# Patient Record
Sex: Female | Born: 1935 | Race: White | Hispanic: No | Smoking: Never smoker
Health system: Southern US, Community
[De-identification: ages and names within clinical notes are randomized; demographics above are authoritative.]

## PROBLEM LIST (undated history)

## (undated) DIAGNOSIS — M810 Age-related osteoporosis without current pathological fracture: Secondary | ICD-10-CM

## (undated) HISTORY — DX: Age-related osteoporosis without current pathological fracture: M81.0

---

## 1962-02-19 HISTORY — PX: BLADDER AUGMENTATION: SHX1233

## 2008-02-20 HISTORY — PX: SPINE SURGERY: SHX786

## 2009-02-17 ENCOUNTER — Encounter: Payer: Self-pay | Admitting: Internal Medicine

## 2009-02-19 HISTORY — PX: SPINE SURGERY: SHX786

## 2009-03-10 ENCOUNTER — Encounter: Payer: Self-pay | Admitting: Internal Medicine

## 2009-09-16 ENCOUNTER — Encounter: Payer: Self-pay | Admitting: Internal Medicine

## 2010-01-24 ENCOUNTER — Ambulatory Visit: Payer: Self-pay | Admitting: Internal Medicine

## 2010-01-24 ENCOUNTER — Encounter: Payer: Self-pay | Admitting: Internal Medicine

## 2010-01-24 DIAGNOSIS — M549 Dorsalgia, unspecified: Secondary | ICD-10-CM | POA: Insufficient documentation

## 2010-01-24 DIAGNOSIS — M1990 Unspecified osteoarthritis, unspecified site: Secondary | ICD-10-CM

## 2010-01-24 DIAGNOSIS — M810 Age-related osteoporosis without current pathological fracture: Secondary | ICD-10-CM

## 2010-01-24 DIAGNOSIS — E785 Hyperlipidemia, unspecified: Secondary | ICD-10-CM | POA: Insufficient documentation

## 2010-02-01 ENCOUNTER — Telehealth: Payer: Self-pay | Admitting: Internal Medicine

## 2010-02-15 ENCOUNTER — Telehealth: Payer: Self-pay | Admitting: Internal Medicine

## 2010-02-17 ENCOUNTER — Telehealth: Payer: Self-pay | Admitting: Internal Medicine

## 2010-02-18 ENCOUNTER — Ambulatory Visit: Payer: Self-pay | Admitting: Internal Medicine

## 2010-02-18 ENCOUNTER — Telehealth: Payer: Self-pay | Admitting: Internal Medicine

## 2010-02-18 ENCOUNTER — Encounter: Payer: Self-pay | Admitting: Family Medicine

## 2010-02-19 HISTORY — PX: SPINE SURGERY: SHX786

## 2010-02-24 ENCOUNTER — Ambulatory Visit (HOSPITAL_COMMUNITY)
Admission: RE | Admit: 2010-02-24 | Discharge: 2010-02-24 | Payer: Self-pay | Source: Home / Self Care | Attending: Interventional Radiology | Admitting: Interventional Radiology

## 2010-02-24 LAB — PROTIME-INR
INR: 1.02 (ref 0.00–1.49)
Prothrombin Time: 13.6 seconds (ref 11.6–15.2)

## 2010-02-24 LAB — CBC
HCT: 39.9 % (ref 36.0–46.0)
Hemoglobin: 13.1 g/dL (ref 12.0–15.0)
MCH: 29.7 pg (ref 26.0–34.0)
MCHC: 32.8 g/dL (ref 30.0–36.0)
MCV: 90.5 fL (ref 78.0–100.0)
Platelets: 225 10*3/uL (ref 150–400)
RBC: 4.41 MIL/uL (ref 3.87–5.11)
RDW: 12.6 % (ref 11.5–15.5)
WBC: 6.9 10*3/uL (ref 4.0–10.5)

## 2010-02-24 LAB — POCT I-STAT, CHEM 8
BUN: 37 mg/dL — ABNORMAL HIGH (ref 6–23)
Calcium, Ion: 1.13 mmol/L (ref 1.12–1.32)
Chloride: 110 mEq/L (ref 96–112)
Creatinine, Ser: 1.2 mg/dL (ref 0.4–1.2)
Glucose, Bld: 96 mg/dL (ref 70–99)
HCT: 42 % (ref 36.0–46.0)
Hemoglobin: 14.3 g/dL (ref 12.0–15.0)
Potassium: 4 mEq/L (ref 3.5–5.1)
Sodium: 142 mEq/L (ref 135–145)
TCO2: 27 mmol/L (ref 0–100)

## 2010-02-24 LAB — APTT: aPTT: 28 seconds (ref 24–37)

## 2010-03-09 ENCOUNTER — Ambulatory Visit: Admit: 2010-03-09 | Payer: Self-pay | Admitting: Internal Medicine

## 2010-03-10 ENCOUNTER — Ambulatory Visit (HOSPITAL_COMMUNITY)
Admission: RE | Admit: 2010-03-10 | Discharge: 2010-03-10 | Payer: Self-pay | Source: Home / Self Care | Attending: Family Medicine | Admitting: Family Medicine

## 2010-03-21 NOTE — Assessment & Plan Note (Signed)
Summary: NEW PT TO EST/CLE   Vital Signs:  Patient profile:   77 year old female Height:      59 inches Weight:      140 pounds BMI:     28.38 O2 Sat:      97 % on Room air Temp:     98.4 degrees F oral Pulse rate:   76 / minute Pulse rhythm:   regular BP sitting:   180 / 100  (left arm) Cuff size:   regular  Vitals Entered By: Mervin Hack CMA Duncan Dull) (January 24, 2010 11:48 AM)  O2 Flow:  Room air CC: new patient to establish care   History of Present Illness: Moved up from [place] in September Lives in [facility] [place] [facility] Chauncey Fischer is with her and lives in [place]  Fairly healthy Does have osteoporosis with vertebral fracture  Has arthritis --mostly in hands doesn't need meds Had arthroscopy on knee in past--but was for meniscus  Has long standing high cholesterol takes red yeast rice and is satisfied with this no history of heart disease  Back pain is severe limits her activity using ibuprofen --up to 4 per day Centered in lumbar spine and into right buttock No leg weakness  Preventive Screening-Counseling & Management  Alcohol-Tobacco     Smoking Status: never  Allergies (verified): No Known Drug Allergies  Past History:  Past Medical History: Osteoporosis Osteoarthritis--hands mostly Hyperlipidemia  Past Surgical History: Bladder tuck in 1960's Left knee arthroscopy   ~2004 T11 vertebroplasty--  08-Mar-2022  Family History: Mom died @106  of old age Dad died of colon cancer @72  4 brothers - 1 died in 106's (didn't take care of himself), 1 died of old age in 109's. 1 died of brain cancer in childhood 1 sister--died of cancer No CAD DM in 1 brother   Social History: Widowed twice and divorced once 4 children Never Smoked Alcohol use-rarely Retired as Solicitor for [employer]. Various other jobs  Has living will. Asks for son Gerlene Burdock to be health care POA. Would accept resuscitation but no prolonged  ventilation. Not sure about tube feeds Smoking Status:  never  Review of Systems General:  weight down a few pounds still drives sleeps well wears seat belt. Eyes:  Denies double vision and vision loss-1 eye. ENT:  Denies decreased hearing and ringing in ears; Upper plate, a few on the bottom. Keeps up with dentist. CV:  Denies chest pain or discomfort, difficulty breathing at night, difficulty breathing while lying down, fainting, lightheadness, palpitations, and shortness of breath with exertion. Resp:  Denies cough and shortness of breath. GI:  Complains of constipation and indigestion; denies bloody stools, change in bowel habits, dark tarry stools, nausea, and vomiting; occ heartburn--uses OTC antacid like tums as needed  Uses prune juice for bowels. GU:  Denies dysuria and incontinence; urinary urgency if she has coffee. MS:  Complains of joint pain; denies joint swelling. Derm:  Complains of lesion(s); denies rash; rash in head--?psoriasis vs seb derm Has appt with derm. Neuro:  Denies headaches, numbness, tingling, and weakness. Psych:  Denies anxiety and depression. Allergy:  Denies seasonal allergies and sneezing.  Physical Exam  General:  alert and normal appearance.   Eyes:  pupils equal, pupils round, and pupils reactive to light.   Mouth:  no erythema, no exudates, and no lesions.   Neck:  supple, no masses, no thyromegaly, no carotid bruits, and no cervical lymphadenopathy.   Lungs:  normal respiratory effort, no intercostal retractions, no accessory muscle use, and normal breath sounds.   Heart:  normal rate, regular rhythm, no murmur, and no gallop.   Abdomen:  soft, non-tender, and no masses.   Msk:  mild thickening in hands mild spine tenderness  ~L3-4 with mild tenderness to the right also SLR negative  Neurologic:  gait slow but normal No focal weakness Psych:  normally interactive, good eye contact, not anxious appearing, and not depressed appearing.      Impression & Recommendations:  Problem # 1:  BACK PAIN (ICD-724.5) Assessment Deteriorated not clear if this is just muscular or from new fracture awaiting x-ray reading now will try tramadol for now and set up follow up  Her updated medication list for this problem includes:    Ibuprofen 200 Mg Tabs (Ibuprofen) ..... Once daily    Tramadol Hcl 50 Mg Tabs (Tramadol hcl) .Marland Kitchen... 1 tab by mouth three times a day as needed for severe back pain  Orders: T-Lumbar Spine Complete, 5 Views (71110TC)  Problem # 2:  OSTEOPOROSIS (ICD-733.00) Assessment: Comment Only have asked her to add extra vitaimn D will await the results of the DEXA done earlier this year consider alendronate if sig osteoporosis or if radiologist believes there are multiple compression fractures   Her updated medication list for this problem includes:    Calcium-vitamin D 500-125 Mg-unit Tabs (Calcium carbonate-vitamin d) ..... Once daily    Vitamin D 1000 Unit Tabs (Cholecalciferol) .Marland Kitchen... 1 tab daily to help bones  Problem # 3:  OSTEOARTHRITIS (ICD-715.90) Assessment: Comment Only other than her back, her symptoms are mild no new Rx needed  Her updated medication list for this problem includes:    Ibuprofen 200 Mg Tabs (Ibuprofen) ..... Once daily    Tramadol Hcl 50 Mg Tabs (Tramadol hcl) .Marland Kitchen... 1 tab by mouth three times a day as needed for severe back pain  Problem # 4:  HYPERLIPIDEMIA (ICD-272.4) Assessment: Comment Only treatment not indicated with her good health at her age okay to continue the red yeast rice though will revew her records when they come  Complete Medication List: 1)  Red Yeast Rice 600 Mg Caps (Red yeast rice extract) .... Once daily 2)  Vitamin C 1000 Mg Tabs (Ascorbic acid) .... Once daily 3)  Womens Multivitamin Plus Tabs (Multiple vitamins-minerals) .... Once daily 4)  Vitamin A-beta Carotene 14782 Unit Caps (Beta carotene) .... Once daily 5)  Calcium-vitamin D 500-125 Mg-unit  Tabs (Calcium carbonate-vitamin d) .... Once daily 6)  Fish Oil 1000 Mg Caps (Omega-3 fatty acids) .... Once daily 7)  Ibuprofen 200 Mg Tabs (Ibuprofen) .... Once daily 8)  Miralax Powd (Polyethylene glycol 3350) .Marland Kitchen.. 1 capful daily with water as needed for constipation 9)  Vitamin D 1000 Unit Tabs (Cholecalciferol) .Marland Kitchen.. 1 tab daily to help bones 10)  Tramadol Hcl 50 Mg Tabs (Tramadol hcl) .Marland Kitchen.. 1 tab by mouth three times a day as needed for severe back pain  Other Orders: TD Toxoids IM 7 YR + (95621) Admin 1st Vaccine (30865)  Patient Instructions: 1)  Please start vitamin D 1000 international units daily 2)  Please schedule a follow-up appointment in 1 month.  Prescriptions: TRAMADOL HCL 50 MG TABS (TRAMADOL HCL) 1 tab by mouth three times a day as needed for severe back pain  #90 x 0   Entered and Authorized by:   Cindee Salt MD   Signed by:   Cindee Salt MD on 01/24/2010  Method used:   [facility]  [address] [id number]       [address]       [place], [place]  [address]       Ph: [phone]       Fax: [phone]   RxID:   [id number]    Orders Added: 1)  T-Lumbar Spine Complete, 5 Views [71110TC] 2)  TD Toxoids IM 7 YR + [90714] 3)  Admin 1st Vaccine [90471] 4)  New Patient Level IV [99204]   Immunization History:  Tetanus/Td Immunization History:    Tetanus/Td:  Td (01/24/2010)  Influenza Immunization History:    Influenza:  Historical (11/18/2009)  Pneumovax Immunization History:    Pneumovax:  Historical (02/19/2002)  Immunizations Administered:  Tetanus Vaccine:    Vaccine Type: Td    Site: left deltoid    Mfr: Sanofi Pasteur    Dose: 0.5 ml    Route: IM    Given by: Benny Lennert CMA (AAMA)    Exp. Date: 03/23/2011    Lot #: [id number]    VIS given: 01/07/08 version given January 24, 2010.   Immunization History:  Influenza Immunization History:    Influenza:  Historical  (11/18/2009)  Pneumovax Immunization History:    Pneumovax:  Historical (02/19/2002)  Immunizations Administered:  Tetanus Vaccine:    Vaccine Type: Td    Site: left deltoid    Mfr: Sanofi Pasteur    Dose: 0.5 ml    Route: IM    Given by: Benny Lennert CMA (AAMA)    Exp. Date: 03/23/2011    Lot #: [id number]    VIS given: 01/07/08 version given January 24, 2010.  Current Allergies (reviewed today): No known allergies

## 2010-03-23 NOTE — Progress Notes (Signed)
Summary: back is not any better  Phone Note Call from Patient Call back at Home Phone [phone]   Caller: Patient Call For: Cindee Salt MD Summary of Call: Pt was seen for her back pain a couple of weeks ago and she is not any better, this is really bothering her.  She is asking if she can have an MRI.  She would prefer to go to [facility] for that.  She was told to follow up with you in mid january but she says she cant wait that long.  She would like something done asap. Initial call taken by: Lowella Petties CMA, AAMA,  February 15, 2010 11:20 AM  Follow-up for Phone Call        okay to set up MRI given persistent pain and x-ray findings Follow-up by: Cindee Salt MD,  February 15, 2010 11:58 AM

## 2010-03-23 NOTE — Progress Notes (Signed)
Summary: MRI results  Phone Note From Other Clinic   Caller: Dr Tenny Craw Call For: letvak Summary of Call: Pt had xray done at [facility] as outpatient----Pt has acute/ subacute compression fx L2-3 and subacute compression fracture L1-2 with extensive DDD.   Initial call taken by: Loreen Freud DO,  February 18, 2010 11:44 AM  Follow-up for Phone Call        The MRI??  x-ray done several weeks ago  Please confirm this If this is the finding of the MRI, let the patient know she has fracture of vertebrae and typically the pain does get better over several weeks Cindee Salt MD  February 20, 2010 9:20 AM   got MRI faxed results from [facility], will get Dr.Aron to confirm. Dr. Dayton Martes the report in on your desk. Please advise. DeShannon Smith CMA Duncan Dull)  February 21, 2010 12:52 PM   MRI results are scanned into emr, she does have compression fractures. Ruthe Mannan MD  February 21, 2010 1:37 PM  patient would like to know what can be done to fix this? pt states she's had this before and they put cement in and fixed it right away, pt states she in pain and it's not getting better. Please advise. DeShannon Katrinka Blazing CMA Duncan Dull)  February 21, 2010 2:23 PM   Additional Follow-up for Phone Call Additional follow up Details #1::        I would recommend conservation management with pain control.  She can discuss further tx options with Dr. Alphonsus Sias next wee but I I treat my patients with pain medications. Ruthe Mannan MD  February 21, 2010 2:37 PM  pt would like to be referred to [facility] because of the pain. I tried to explain to pt that it might take a little longer for referrals, pt wants something this week, pt states she will not wait any longer and she will go to [facility] if something if is not done. I asked pt was she still taking the Tramadol, she said "no, it made her sick" she is taking ibuprofen 4 times daily. Please advise. DeShannon Smith CMA Duncan Dull)  February 21, 2010 3:18 PM   we can try oxycodone.  If she is  not willing, I will place referral for pain clnic but there are many medications she has not tried yet. Ruthe Mannan MD  February 21, 2010 3:22 PM  I spoke with Shirlee Limerick, she came me [facility] name and number in [place], pt wants surgery to fix the problem. Ok to call and get appt? DeShannon Smith CMA Duncan Dull)  February 21, 2010 3:36 PM   yes ok with me if pt is insisting. Ruthe Mannan MD  February 22, 2010 7:35 AM  Not sure why this keeps getting sent back to me. Ruthe Mannan MD  February 22, 2010 10:47 AM  Dr.Califf could see pt Monday 02/27/10, but pt refused and wanted to be seen by a Interventional Radiologist, Dr.Copland suggested Dr.Tony Deveshwar, I called his office to schedule a consult, Dr. Corliss Skains can see pt tomorrow at [facility] at 12:30pm, I advised pt to pick up MRI disc from [facility] today. Previous MRI from [place] faxed to Dr. Corliss Skains. DeShannon Smith CMA Duncan Dull)  February 22, 2010 10:51 AM   I would have thought of Dr Gerrit Heck in [place] as well.  not sure she is a candidate for vertebroplasty but appopriate for evaluation to decide Cindee Salt MD  February 22, 2010 9:30 PM     Additional Follow-up for Phone Call Additional follow up Details #2::    ok.  Ruthe Mannan MD  February 22, 2010 10:52 AM

## 2010-03-23 NOTE — Progress Notes (Signed)
Summary: pt doing ok now  Phone Note Call from Patient   Caller: Patient Call For: Cindee Salt MD Reason for Call: Talk to Doctor Summary of Call: Pt called to let you know that she couldnt take tramadol because it caused nausea and sweating.  She is doing ok now, taking ibuprofen, which is helping.  She will see you at her follow up appt. Initial call taken by: Lowella Petties CMA, AAMA,  February 01, 2010 10:14 AM  Follow-up for Phone Call        okay  glad she is better Follow-up by: Cindee Salt MD,  February 01, 2010 1:57 PM   New Allergies: TRAMADOL HCL (TRAMADOL HCL) New Allergies: TRAMADOL HCL (TRAMADOL HCL)

## 2010-03-23 NOTE — Letter (Signed)
Summary: Records Dated 02-16-09 thru 09-21-09/Susan Binoy MD  Records Dated 02-16-09 thru 09-21-09/Susan Binoy MD   Imported By: Lanelle Bal 02/04/2010 10:19:29  _____________________________________________________________________  External Attachment:    Type:   Image     Comment:   External Document

## 2010-03-23 NOTE — Progress Notes (Signed)
Summary: Constipated  Phone Note Call from Patient Call back at Home Phone [phone]   Caller: Patient Call For: Cindee Salt MD Summary of Call: pt calling asking for something for constipation, she states she's been " bound up" pt would like the same thing they give your before your colonoscopy. Initial call taken by: Mervin Hack CMA Duncan Dull),  February 17, 2010 10:14 AM  Follow-up for Phone Call        per verbal with Dr.Letvak, they give you miralax before colonoscopy, pt is to take a full capful every 2 hours until relief. DeShannon Smith CMA Duncan Dull)  February 17, 2010 10:14 AM   spoke with pt she will try that, she's only been doing a tablespoon in a little water. I advised she should have at least 8oz of water, correct? DeShannon Katrinka Blazing CMA Duncan Dull)  February 17, 2010 10:17 AM   Yes, a full capful (17gm) with a big glass of water (at least 8 ounces) Follow-up by: Cindee Salt MD,  February 17, 2010 11:12 AM  Additional Follow-up for Phone Call Additional follow up Details #1::        Spoke with patient and advised results.  Additional Follow-up by: Mervin Hack CMA Duncan Dull),  February 17, 2010 2:49 PM

## 2010-03-23 NOTE — Progress Notes (Signed)
Summary: LORAZEPAM/ MRI  Phone Note Call from Patient   Caller: Patient Summary of Call: Called patient  to schedule the MRI at [facility] for Saturday 02/18/2010 . She wants to know if you can call in some medicine for anxiety she is claustrophobic. [facility] [address] Please have nurse call her back when med is called in.  Initial call taken by: Carlton Adam,  February 15, 2010 2:45 PM  Follow-up for Phone Call        Lorazepam 0.5mg   #4 x 0 take 1 tab about 1 hour  before MRI and repeat 1/2 hour before if needed Make sure someone else is driving Follow-up by: Cindee Salt MD,  February 15, 2010 9:18 PM  Additional Follow-up for Phone Call Additional follow up Details #1::        Rx Called In, Spoke with patient and advised results.  Additional Follow-up by: Mervin Hack CMA Duncan Dull),  February 16, 2010 8:45 AM    New/Updated Medications: LORAZEPAM 0.5 MG TABS (LORAZEPAM) take 1 tab about 1 hour  before MRI and repeat 1/2 hour before if needed Prescriptions: LORAZEPAM 0.5 MG TABS (LORAZEPAM) take 1 tab about 1 hour  before MRI and repeat 1/2 hour before if needed  #4 x 0   Entered by:   Mervin Hack CMA (AAMA)   Authorized by:   Cindee Salt MD   Signed by:   Mervin Hack CMA (AAMA) on 02/16/2010   Method used:   [facility] ...       [facility]  [address] [id number]       [address]       [place], [place]  [address]       Ph: [phone]       Fax: [phone]   RxID:   [id number]

## 2010-04-27 ENCOUNTER — Emergency Department: Payer: Self-pay | Admitting: Emergency Medicine

## 2011-02-21 ENCOUNTER — Other Ambulatory Visit (HOSPITAL_COMMUNITY): Payer: Self-pay | Admitting: Interventional Radiology

## 2011-02-21 DIAGNOSIS — M549 Dorsalgia, unspecified: Secondary | ICD-10-CM

## 2011-02-21 DIAGNOSIS — IMO0002 Reserved for concepts with insufficient information to code with codable children: Secondary | ICD-10-CM

## 2011-02-22 ENCOUNTER — Ambulatory Visit (HOSPITAL_COMMUNITY)
Admission: RE | Admit: 2011-02-22 | Discharge: 2011-02-22 | Disposition: A | Discharge status: Home / Self Care | Payer: MEDICARE | Source: Ambulatory Visit | Attending: Interventional Radiology | Admitting: Interventional Radiology

## 2011-02-22 ENCOUNTER — Other Ambulatory Visit: Payer: Self-pay | Admitting: Radiology

## 2011-02-22 ENCOUNTER — Other Ambulatory Visit (HOSPITAL_COMMUNITY): Payer: Self-pay | Admitting: Interventional Radiology

## 2011-02-22 DIAGNOSIS — M545 Low back pain, unspecified: Secondary | ICD-10-CM | POA: Insufficient documentation

## 2011-02-22 DIAGNOSIS — IMO0002 Reserved for concepts with insufficient information to code with codable children: Secondary | ICD-10-CM

## 2011-02-22 DIAGNOSIS — M5124 Other intervertebral disc displacement, thoracic region: Secondary | ICD-10-CM | POA: Insufficient documentation

## 2011-02-22 DIAGNOSIS — M4120 Other idiopathic scoliosis, site unspecified: Secondary | ICD-10-CM | POA: Insufficient documentation

## 2011-02-22 DIAGNOSIS — M47817 Spondylosis without myelopathy or radiculopathy, lumbosacral region: Secondary | ICD-10-CM | POA: Insufficient documentation

## 2011-02-22 DIAGNOSIS — M549 Dorsalgia, unspecified: Secondary | ICD-10-CM

## 2011-02-22 DIAGNOSIS — S32009A Unspecified fracture of unspecified lumbar vertebra, initial encounter for closed fracture: Secondary | ICD-10-CM | POA: Insufficient documentation

## 2011-02-22 DIAGNOSIS — X58XXXA Exposure to other specified factors, initial encounter: Secondary | ICD-10-CM | POA: Insufficient documentation

## 2011-02-23 ENCOUNTER — Other Ambulatory Visit (HOSPITAL_COMMUNITY): Payer: Self-pay | Admitting: Interventional Radiology

## 2011-02-23 ENCOUNTER — Ambulatory Visit (HOSPITAL_COMMUNITY)
Admission: RE | Admit: 2011-02-23 | Discharge: 2011-02-23 | Disposition: A | Discharge status: Home / Self Care | Payer: MEDICARE | Source: Ambulatory Visit | Attending: Interventional Radiology | Admitting: Interventional Radiology

## 2011-02-23 DIAGNOSIS — IMO0002 Reserved for concepts with insufficient information to code with codable children: Secondary | ICD-10-CM

## 2011-02-23 DIAGNOSIS — M8448XA Pathological fracture, other site, initial encounter for fracture: Secondary | ICD-10-CM | POA: Insufficient documentation

## 2011-02-23 LAB — CBC
MCH: 30.3 pg (ref 26.0–34.0)
MCV: 90.3 fL (ref 78.0–100.0)
Platelets: 211 10*3/uL (ref 150–400)
RDW: 12.8 % (ref 11.5–15.5)

## 2011-02-23 LAB — BASIC METABOLIC PANEL
CO2: 27 mEq/L (ref 19–32)
Calcium: 9.4 mg/dL (ref 8.4–10.5)
Creatinine, Ser: 0.75 mg/dL (ref 0.50–1.10)
GFR calc non Af Amer: 71 mL/min — ABNORMAL LOW (ref >90)
Glucose, Bld: 98 mg/dL (ref 70–99)

## 2011-02-23 LAB — APTT: aPTT: 29 seconds (ref 24–37)

## 2011-02-23 MED ORDER — SODIUM CHLORIDE 0.9 % IV SOLN: INTRAVENOUS | Status: AC

## 2011-02-23 MED ORDER — TOBRAMYCIN SULFATE 1.2 G IJ SOLR
INTRAMUSCULAR | Status: AC
  Filled 2011-02-23: qty 1.2

## 2011-02-23 MED ORDER — VANCOMYCIN HCL IN DEXTROSE 1-5 GM/200ML-% IV SOLN
1000.0000 mg | Freq: Once | INTRAVENOUS | Status: AC
  Administered 2011-02-23: 1000 mg via INTRAVENOUS
  Filled 2011-02-23: qty 200

## 2011-02-23 MED ORDER — SODIUM CHLORIDE 0.9 % IV SOLN: INTRAVENOUS | Status: DC

## 2011-02-23 MED ORDER — FENTANYL CITRATE 0.05 MG/ML IJ SOLN
INTRAMUSCULAR | Status: AC | PRN
  Administered 2011-02-23 (×2): 25 ug via INTRAVENOUS

## 2011-02-23 MED ORDER — FENTANYL CITRATE 0.05 MG/ML IJ SOLN
INTRAMUSCULAR | Status: AC
  Filled 2011-02-23: qty 6

## 2011-02-23 MED ORDER — MIDAZOLAM HCL 2 MG/2ML IJ SOLN
INTRAMUSCULAR | Status: AC
  Filled 2011-02-23: qty 6

## 2011-02-23 MED ORDER — CEFAZOLIN SODIUM 1-5 GM-% IV SOLN
1.0000 g | INTRAVENOUS | Status: DC
  Filled 2011-02-23: qty 50

## 2011-02-23 MED ORDER — MIDAZOLAM HCL 5 MG/5ML IJ SOLN
INTRAMUSCULAR | Status: AC | PRN
  Administered 2011-02-23 (×2): 1 mg via INTRAVENOUS

## 2011-02-23 NOTE — H&P (Signed)
Rebekah Bridges is an 78 y.o. female.   Chief Complaint: Thoracic #12 fracture; back pain HPI: hx of Thoracic #11; Lumbar #2 and #3 previous vertebroplasties Scheduled today for Thoracic #12 Vertebroplasty vs Kyphoplasty  No past medical history on file.  No past surgical history on file.  No family history on file. Social History:  does not have a smoking history on file. She does not have any smokeless tobacco history on file. Her alcohol and drug histories not on file.  Allergies:  Allergies  Allergen Reactions  . Penicillins Nausea Only    And a rash  . Sulfa Antibiotics Nausea Only    And rash  . Tramadol Hcl Other (See Comments)    REACTION: nausea and sweating    No current outpatient prescriptions on file as of 02/23/2011.   Medications Prior to Admission  Medication Dose Route Frequency Provider Last Rate Last Dose  . 0.9 %  sodium chloride infusion   Intravenous Continuous D Jeananne Rama, PA      . ceFAZolin (ANCEF) IVPB 1 g/50 mL premix  1 g Intravenous On Call D Jeananne Rama, PA        Results for orders placed during the hospital encounter of 02/23/11 (from the past 48 hour(s))  APTT     Status: Normal   Collection Time   02/23/11 11:00 AM      Component Value Range Comment   aPTT 29  24 - 37 (seconds)   BASIC METABOLIC PANEL     Status: Abnormal   Collection Time   02/23/11 11:00 AM      Component Value Range Comment   Sodium 139  135 - 145 (mEq/L)    Potassium 3.7  3.5 - 5.1 (mEq/L)    Chloride 101  96 - 112 (mEq/L)    CO2 27  19 - 32 (mEq/L)    Glucose, Bld 98  70 - 99 (mg/dL)    BUN 19  6 - 23 (mg/dL)    Creatinine, Ser 1.19  0.50 - 1.10 (mg/dL)    Calcium 9.4  8.4 - 10.5 (mg/dL)    GFR calc non Af Amer 71 (*) >90 (mL/min)    GFR calc Af Amer 82 (*) >90 (mL/min)   CBC     Status: Normal   Collection Time   02/23/11 11:00 AM      Component Value Range Comment   WBC 6.4  4.0 - 10.5 (K/uL)    RBC 4.75  3.87 - 5.11 (MIL/uL)    Hemoglobin 14.4  12.0 - 15.0 (g/dL)     HCT 14.7  82.9 - 56.2 (%)    MCV 90.3  78.0 - 100.0 (fL)    MCH 30.3  26.0 - 34.0 (pg)    MCHC 33.6  30.0 - 36.0 (g/dL)    RDW 13.0  86.5 - 78.4 (%)    Platelets 211  150 - 400 (K/uL)   PROTIME-INR     Status: Normal   Collection Time   02/23/11 11:00 AM      Component Value Range Comment   Prothrombin Time 13.5  11.6 - 15.2 (seconds)    INR 1.01  0.00 - 1.49     Mr Lumbar Spine Wo Contrast  02/22/2011  *RADIOLOGY REPORT*  Clinical Data: Compression fracture.  Pain in the low back. Multiple prior compression fractures.  MRI LUMBAR SPINE WITHOUT CONTRAST  Technique:  Multiplanar and multiecho pulse sequences of the lumbar spine were obtained without intravenous contrast.  Comparison: 03/10/2010.  Findings: There is mild levoconvex lumbar scoliosis with the apex at L2-L3.  Vertebral augmentation has been performed at T11, L2 and L3 which appears similar to the prior exam.  There is a new T12 superior endplate compression fracture with 25% loss of vertebral body height.  Radiating bone marrow edema is present compatible with acute subacute time course.  No retropulsion.  The paraspinal soft tissues demonstrate bilateral renal cystic lesions likely renal cysts.  Per CMS PQRS reporting requirements (PQRS Measure 24): Given the patient's age of greater than 50 and the fracture site (hip, distal radius, or spine), the patient should be tested for osteoporosis using DXA, and the appropriate treatment considered based on the DXA results.  T10-T11:  Negative.  T11-T12:  Negative.  T12-L1:  Broad-based disc bulge with small left paracentral protrusion appears similar to prior.  L1-L2:  Unchanged L2 retropulsion.  Resolved bone marrow edema status post vertebral augmentation.  Moderate central stenosis associated with retropulsion and broad-based disc bulging. Posterior ligamentum flavum redundancy contributes.  Foramina appear patent.  L2-L3:  Moderate central stenosis is multifactorial. Resolved L3 marrow edema  following vertebral augmentation. Broad-based posterior disc bulge and ligamentum flavum redundancy are present.  L3-L4: Moderate central stenosis appears unchanged compared to prior.  This is multifactorial, associated with broad-based disc bulge, and posterior element hypertrophy and ligamentum flavum redundancy.  L4-L5:  Disc desiccation and degeneration with moderate central stenosis.  Narrowing of both lateral recesses without neural compression.  Foramina appear patent.  L5-S1:  No stenosis.  IMPRESSION:  1.  Acute or subacute T12 superior endplate compression fracture at 25% loss of vertebral body height. 2.  Vertebral augmentation at T11, L2 and L3 with resolved bone marrow edema compared to prior exam of 03/10/2010. 3.  Multilevel lumbar spondylosis appears similar to prior.  Original Report Authenticated By: Andreas Newport, M.D.    Review of Systems  Constitutional: Negative for fever and chills.  Respiratory: Negative for cough.   Cardiovascular: Negative for chest pain.  Gastrointestinal: Negative for nausea, vomiting and abdominal pain.  Neurological: Negative for headaches.    There were no vitals taken for this visit. Physical Exam  Constitutional: She is oriented to person, place, and time. She appears well-developed and well-nourished.  HENT:  Head: Normocephalic.  Eyes: EOM are normal.  Neck: Normal range of motion.  Cardiovascular: Normal rate, regular rhythm and normal heart sounds.   No murmur heard. Respiratory: Effort normal and breath sounds normal. She has no wheezes.  GI: Soft. Bowel sounds are normal.  Musculoskeletal: Normal range of motion.  Neurological: She is alert and oriented to person, place, and time.  Skin: Skin is warm and dry.     Assessment/Plan Thoracic #12 fracture; scheduled for T12 KP vs VP Pt aware of procedure benefits and risks and agreeable to proceed. Consent signed.  Ivania Teagarden A 02/23/2011, 12:24 PM

## 2011-02-23 NOTE — Procedures (Signed)
S/P  T12 balloon  kyphoplasty  For painful compression fracture.. No acute complications.

## 2011-02-27 ENCOUNTER — Telehealth (HOSPITAL_COMMUNITY): Payer: Self-pay

## 2011-03-06 ENCOUNTER — Other Ambulatory Visit (HOSPITAL_COMMUNITY): Payer: Self-pay | Admitting: Interventional Radiology

## 2011-03-06 DIAGNOSIS — IMO0002 Reserved for concepts with insufficient information to code with codable children: Secondary | ICD-10-CM

## 2011-03-09 ENCOUNTER — Ambulatory Visit (HOSPITAL_COMMUNITY): Payer: BC Managed Care – PPO

## 2011-05-08 NOTE — Telephone Encounter (Signed)
Contacts         Type  Contact  Phone    02/27/2011 10:07 AM  Phone (Outgoing)  Lorra Hals (Self)      Left Message- left vmail to schedule 2 week f/u appt

## 2012-07-07 ENCOUNTER — Telehealth: Payer: Self-pay | Admitting: Internal Medicine

## 2012-07-07 NOTE — Telephone Encounter (Signed)
Left message for patient to confirm appointment with Dr. Lorin Picket.

## 2012-07-07 NOTE — Telephone Encounter (Signed)
error 

## 2012-08-06 ENCOUNTER — Telehealth: Payer: Self-pay | Admitting: Internal Medicine

## 2012-08-06 NOTE — Telephone Encounter (Signed)
Pt son (richard) came in today wanting to see if we could work ms Shankland in sooner than her appointment on 08/29/12 new patient appointment  Gerlene Burdock stated that ms Sandquist is having problems with her left leg and behind her knee is very painful and she is having a hard time getting around. Please advise

## 2012-08-06 NOTE — Telephone Encounter (Signed)
Only if there is a 30 minute slot available .  Do not take up my urgent slots for established patients

## 2012-08-06 NOTE — Telephone Encounter (Signed)
Appointment 7/8 pt aware

## 2012-08-26 ENCOUNTER — Ambulatory Visit (INDEPENDENT_AMBULATORY_CARE_PROVIDER_SITE_OTHER): Payer: BC Managed Care – PPO | Admitting: Internal Medicine

## 2012-08-26 ENCOUNTER — Encounter: Payer: Self-pay | Admitting: Internal Medicine

## 2012-08-26 VITALS — BP 132/86 | HR 68 | Temp 98.0°F | Resp 14 | Ht <= 58 in | Wt 140.5 lb

## 2012-08-26 DIAGNOSIS — Z87898 Personal history of other specified conditions: Secondary | ICD-10-CM

## 2012-08-26 DIAGNOSIS — M810 Age-related osteoporosis without current pathological fracture: Secondary | ICD-10-CM

## 2012-08-26 DIAGNOSIS — I739 Peripheral vascular disease, unspecified: Secondary | ICD-10-CM

## 2012-08-26 NOTE — Patient Instructions (Addendum)
We are setting you up for an eye exam and a test on your circulation   Please return for fasting labs at your leisure   Please call us the next time you have a severe headache  You may add one or two tylenol to your ibuprofen as it may help your pain

## 2012-08-26 NOTE — Progress Notes (Signed)
Patient ID: Rebekah Bridges, female   DOB: 10/27/1915, 79 y.o.   MRN: [id number]     Patient Active Problem List   Diagnosis Date Noted  . PAD (peripheral artery disease) 08/28/2012  . HYPERLIPIDEMIA 01/24/2010  . OSTEOARTHRITIS 01/24/2010  . BACK PAIN 01/24/2010  . OSTEOPOROSIS 01/24/2010    Subjective:  CC:   Chief Complaint  Patient presents with  . Establish Care    HPI:   Rebekah Bridges is a 79 y.o. female who presents as a new patient to establish primary care with the chief complaint of Cc: bilateral leg pain, chronic, etiology and workup unclear  secondary to VI.  Has been receiving "laser" treatments aimed at knees by  [facility], chiropractor performing  K laser treatment to" improve blood flow" .  Has had 4 treatments,  6 more to go costing $350 for 10 tx's .  Feels that it is helping "a little" and denies any post procedure irritation or sequelae.   She is taking ibuprofen 4 tablets daily which relieves some of her pain .  Her Left shin feels numb but also aches.She does not let Dr Chriss Czar perform spinal manipulation .  History of left knee arthrosocopy  Had adverse reaction to tramadol.    transferring care from [facility]   No traditional healthcare in 2.5 years.    2) recent episode of persistent  headache, that lasted 3 to 4 days.,  Severe,  not relieved by ibuprofen,.  No changes in vision, no neurologic symptoms. Does not recall anu accompanying sinus symptoms,  Started with a stiff neck.  No fevers or menstal status changes per son.  .  Now resolved,  But scalp feels funny now in that area right occipital area, not tender or red.  No history of tick bite, travel, or fall.    3) History of osteoporosis with vertebral fracture in 2010 after lifitng a 22 lb [place] at home during thanksgiving.  Fractures occurred in the  Upper thoracicarea, and were  followed by 2 more since her move to [place].,  All were treated with vertebroplasty, 1 in [place], [place] and  2 at [facility] by [employer].  The  [place] procidure resulted in immediate resolution of pain bc patient reports that the porcedure involved "drilling two holes instead of one".  Has had sustained pain relief.   The 2 done at [facility] took a week before pain was resolved..  Last fracture 2012  No priro  heavy lifting , no trauma. Prior treatment for osteoporosis was offered , unsure which one ,  ("somethingin a monthly shot  by the [employer]") was too $$$$  In 2010.      Past Medical History  Diagnosis Date  . Osteoporosis     s/p vertebroplasty for fracutres x 3    Past Surgical History  Procedure Laterality Date  . Bladder augmentation  1964  . Spine surgery  2010  . Spine surgery  2011  . Spine surgery  2012    Family History  Problem Relation Age of Onset  . Cancer Father   . Learning disabilities Paternal Grandmother     History   Social History  . Marital Status: Widowed    Spouse Name: N/A    Number of Children: N/A  . Years of Education: N/A   Occupational History  . Not on file.   Social History Main Topics  . Smoking status: Never Smoker   . Smokeless tobacco: Never Used  . Alcohol Use:  Yes  . Drug Use: No  . Sexually Active: Not on file   Other Topics Concern  . Not on file   Social History Narrative  . No narrative on file    Allergies  Allergen Reactions  . Penicillins Nausea Only    And a rash  . Sulfa Antibiotics Nausea Only    And rash  . Tramadol Hcl Other (See Comments)    REACTION: nausea and sweating      Review of Systems:   Patient denies headache, fevers, malaise, unintentional weight loss, skin rash, eye pain, sinus congestion and sinus pain, sore throat, dysphagia,  hemoptysis , cough, dyspnea, wheezing, chest pain, palpitations, orthopnea, edema, abdominal pain, nausea, melena, diarrhea, constipation, flank pain, dysuria, hematuria, urinary  Frequency, nocturia, numbness, tingling, seizures,  Focal weakness, Loss of consciousness,  Tremor,  insomnia, depression, anxiety, and suicidal ideation.     Objective:  BP 132/86  Pulse 68  Temp(Src) 98 F (36.7 C) (Oral)  Resp 14  Ht 4\' 10"  (1.473 m)  Wt 140 lb 8 oz (63.73 kg)  BMI 29.37 kg/m2  SpO2 96%  General appearance: alert, cooperative and appears stated age Ears: normal TM's and external ear canals both ears Throat: lips, mucosa, and tongue normal; teeth and gums normal Neck: no adenopathy, no carotid bruit, supple, symmetrical, trachea midline and thyroid not enlarged, symmetric, no tenderness/mass/nodules Back: symmetric, no curvature. ROM normal. No CVA tenderness. Lungs: clear to auscultation bilaterally Heart: regular rate and rhythm, S1, S2 normal, no murmur, click, rub or gallop Abdomen: soft, non-tender; bowel sounds normal; no masses,  no organomegaly Pulses: DPS are difficutl to palpate.  Varicose veins noted as well Skin: Skin color, texture, turgor normal. No rashes or lesions Lymph nodes: Cervical, supraclavicular, and axillary nodes normal.  Assessment and Plan:  PAD (peripheral artery disease) Suspected, based on exam with feeble pulses and bilateral leg pain.  Refer to [facility] for ABIS  OSTEOPOROSIS With prior vertebral fractures treated with kyphoplasty.  Given her extreme age, no therapy has been offered. continue calicum, vitamin d and weight bearing exercise,  History of headache Given its complete resolution and normal neurologic exam. No further workup at this time. If episodes recurs will reevaluate   Updated Medication List Outpatient Encounter Prescriptions as of 08/26/2012  Medication Sig Dispense Refill  . ibuprofen (ADVIL,MOTRIN) 200 MG tablet Take 600 mg by mouth every 8 (eight) hours as needed. For pain       . OVER THE COUNTER MEDICATION Take 1 tablet by mouth every 6 (six) hours as needed. Antacid for stomach distress        No facility-administered encounter medications on file as of 08/26/2012.

## 2012-08-28 ENCOUNTER — Encounter: Payer: Self-pay | Admitting: Internal Medicine

## 2012-08-28 DIAGNOSIS — Z87898 Personal history of other specified conditions: Secondary | ICD-10-CM | POA: Insufficient documentation

## 2012-08-28 DIAGNOSIS — I739 Peripheral vascular disease, unspecified: Secondary | ICD-10-CM | POA: Insufficient documentation

## 2012-08-28 NOTE — Assessment & Plan Note (Signed)
Given its complete resolution and normal neurologic exam. No further workup at this time. If episodes recurs will reevaluate

## 2012-08-28 NOTE — Assessment & Plan Note (Signed)
Suspected, based on exam with feeble pulses and bilateral leg pain.  Refer to [facility] for ABIS

## 2012-08-28 NOTE — Assessment & Plan Note (Signed)
With prior vertebral fractures treated with kyphoplasty.  Given her extreme age, no therapy has been offered. continue calicum, vitamin d and weight bearing exercise,

## 2012-08-29 ENCOUNTER — Ambulatory Visit: Payer: BC Managed Care – PPO | Admitting: Internal Medicine

## 2012-09-01 ENCOUNTER — Telehealth: Payer: Self-pay | Admitting: *Deleted

## 2012-09-01 DIAGNOSIS — E785 Hyperlipidemia, unspecified: Secondary | ICD-10-CM

## 2012-09-01 DIAGNOSIS — R5381 Other malaise: Secondary | ICD-10-CM

## 2012-09-01 DIAGNOSIS — E559 Vitamin D deficiency, unspecified: Secondary | ICD-10-CM

## 2012-09-01 NOTE — Telephone Encounter (Signed)
Looks like this is [name]'s pt, may need to forward to her.  Thanks.

## 2012-09-01 NOTE — Telephone Encounter (Signed)
Pt is coming for labs tomorrow 07.15.2014 what labs and dx?

## 2012-09-01 NOTE — Telephone Encounter (Signed)
Pt is coming in for lab work tomorrow 07.15.2014, what labs and dx?

## 2012-09-02 ENCOUNTER — Other Ambulatory Visit (INDEPENDENT_AMBULATORY_CARE_PROVIDER_SITE_OTHER): Payer: BC Managed Care – PPO

## 2012-09-02 DIAGNOSIS — Z79899 Other long term (current) drug therapy: Secondary | ICD-10-CM

## 2012-09-02 DIAGNOSIS — E559 Vitamin D deficiency, unspecified: Secondary | ICD-10-CM

## 2012-09-02 DIAGNOSIS — R5383 Other fatigue: Secondary | ICD-10-CM

## 2012-09-02 DIAGNOSIS — E785 Hyperlipidemia, unspecified: Secondary | ICD-10-CM

## 2012-09-02 DIAGNOSIS — R5381 Other malaise: Secondary | ICD-10-CM

## 2012-09-02 LAB — COMPREHENSIVE METABOLIC PANEL
AST: 21 U/L (ref 0–37)
Albumin: 3.7 g/dL (ref 3.5–5.2)
Alkaline Phosphatase: 91 U/L (ref 39–117)
BUN: 24 mg/dL — ABNORMAL HIGH (ref 6–23)
Potassium: 4.4 mEq/L (ref 3.5–5.1)
Sodium: 139 mEq/L (ref 135–145)

## 2012-09-02 LAB — LIPID PANEL
Cholesterol: 223 mg/dL — ABNORMAL HIGH (ref 0–200)
Total CHOL/HDL Ratio: 5
VLDL: 73.2 mg/dL — ABNORMAL HIGH (ref 0.0–40.0)

## 2012-09-02 LAB — CBC WITH DIFFERENTIAL/PLATELET
Basophils Absolute: 0 10*3/uL (ref 0.0–0.1)
Eosinophils Absolute: 0.1 10*3/uL (ref 0.0–0.7)
MCHC: 33.7 g/dL (ref 30.0–36.0)
MCV: 91.5 fl (ref 78.0–100.0)
Monocytes Absolute: 0.5 10*3/uL (ref 0.1–1.0)
Neutrophils Relative %: 50.1 % (ref 43.0–77.0)
Platelets: 204 10*3/uL (ref 150.0–400.0)
RDW: 13.5 % (ref 11.5–14.6)

## 2012-09-03 MED ORDER — FENOFIBRATE 145 MG PO TABS: 145.0000 mg | ORAL_TABLET | Freq: Every day | ORAL | Status: DC

## 2012-09-03 NOTE — Addendum Note (Signed)
Addended by: Sherlene Shams on: 09/03/2012 11:08 PM   Modules accepted: Orders

## 2012-09-03 NOTE — Assessment & Plan Note (Signed)
Triglycerides are nearly 400.  Will recommend fenofibrate.

## 2012-09-10 ENCOUNTER — Telehealth: Payer: Self-pay | Admitting: *Deleted

## 2012-09-10 NOTE — Telephone Encounter (Signed)
Patient insurance needs Fenofibrate script called in to [facility] pharmacy [phone].

## 2012-09-15 ENCOUNTER — Telehealth: Payer: Self-pay | Admitting: Internal Medicine

## 2012-09-15 ENCOUNTER — Ambulatory Visit: Payer: Self-pay | Admitting: Vascular Surgery

## 2012-09-15 DIAGNOSIS — I739 Peripheral vascular disease, unspecified: Secondary | ICD-10-CM

## 2012-09-15 LAB — BUN: BUN: 22 mg/dL — ABNORMAL HIGH (ref 7–18)

## 2012-09-15 LAB — CREATININE, SERUM: EGFR (Non-African Amer.): >60

## 2012-09-15 NOTE — Assessment & Plan Note (Signed)
ABIS 0.63 right   0.94 left [facility] 09/12/12

## 2012-09-15 NOTE — Telephone Encounter (Signed)
Her recent vascular study suggests that she has pretty significant disease on the right side. Did she actually see a surgeon or just have the test done

## 2012-09-17 NOTE — Telephone Encounter (Signed)
Left message for patient to return call.

## 2012-09-22 ENCOUNTER — Telehealth: Payer: Self-pay | Admitting: *Deleted

## 2012-09-22 ENCOUNTER — Other Ambulatory Visit (INDEPENDENT_AMBULATORY_CARE_PROVIDER_SITE_OTHER): Payer: BC Managed Care – PPO

## 2012-09-22 DIAGNOSIS — E785 Hyperlipidemia, unspecified: Secondary | ICD-10-CM

## 2012-09-22 DIAGNOSIS — Z79899 Other long term (current) drug therapy: Secondary | ICD-10-CM

## 2012-09-22 LAB — LIPID PANEL
Cholesterol: 205 mg/dL — ABNORMAL HIGH (ref 0–200)
HDL: 39.2 mg/dL (ref >39.00)
Total CHOL/HDL Ratio: 5
Triglycerides: 187 mg/dL — ABNORMAL HIGH (ref 0.0–149.0)
VLDL: 37.4 mg/dL (ref 0.0–40.0)

## 2012-09-22 LAB — COMPREHENSIVE METABOLIC PANEL
Alkaline Phosphatase: 94 U/L (ref 39–117)
BUN: 21 mg/dL (ref 6–23)
Glucose, Bld: 89 mg/dL (ref 70–99)
Sodium: 139 mEq/L (ref 135–145)
Total Bilirubin: 0.7 mg/dL (ref 0.3–1.2)
Total Protein: 6.9 g/dL (ref 6.0–8.3)

## 2012-09-22 MED ORDER — FENOFIBRATE 145 MG PO TABS: 145.0000 mg | ORAL_TABLET | Freq: Every day | ORAL | Status: DC

## 2012-09-22 NOTE — Telephone Encounter (Signed)
Refill sent electronically

## 2012-09-22 NOTE — Telephone Encounter (Signed)
Pt came in for labs and stated she needs a refill for the Fenofibrate 145 mg sent to [facility] [phone]

## 2012-09-23 ENCOUNTER — Encounter: Payer: Self-pay | Admitting: Internal Medicine

## 2012-09-23 ENCOUNTER — Encounter: Payer: Self-pay | Admitting: *Deleted

## 2012-09-25 NOTE — Telephone Encounter (Signed)
Patient stated she actually saw a surgeon for the left side and surgery completed. [facility] TR[facility]OR [facility]

## 2012-09-26 ENCOUNTER — Ambulatory Visit (INDEPENDENT_AMBULATORY_CARE_PROVIDER_SITE_OTHER): Payer: BC Managed Care – PPO | Admitting: Internal Medicine

## 2012-09-26 ENCOUNTER — Encounter: Payer: Self-pay | Admitting: Internal Medicine

## 2012-09-26 VITALS — BP 138/78 | HR 74 | Temp 97.8°F | Resp 14 | Wt 140.2 lb

## 2012-09-26 DIAGNOSIS — M1990 Unspecified osteoarthritis, unspecified site: Secondary | ICD-10-CM

## 2012-09-26 DIAGNOSIS — I739 Peripheral vascular disease, unspecified: Secondary | ICD-10-CM

## 2012-09-26 DIAGNOSIS — E785 Hyperlipidemia, unspecified: Secondary | ICD-10-CM

## 2012-09-26 NOTE — Patient Instructions (Addendum)
You are doing great without medication for cholesterol !!  If your knee pain is still severe after your circulation is fixed,  Let me know.,

## 2012-09-26 NOTE — Progress Notes (Signed)
Patient ID: Rebekah Bridges, female   DOB: 1916-02-11, 79 y.o.   MRN: [id number]   Patient Active Problem List   Diagnosis Date Noted  . PAD (peripheral artery disease) 08/28/2012  . History of headache 08/28/2012  . HYPERLIPIDEMIA 01/24/2010  . OSTEOARTHRITIS 01/24/2010  . BACK PAIN 01/24/2010  . OSTEOPOROSIS 01/24/2010    Subjective:  CC:   Chief Complaint  Patient presents with  . Follow-up    HPI:   Rebekah Bridges a 79 y.o. female who presents for followup on newly diagnosed peripheral vascular disease in the setting of hyperlipidemia. Patient underwent a vascular procedure recently on her left leg which has improved her persistent pain on left side. Her right side was the more stenosed artery but she was more symptomatic on the left so this was done first. She has had no complications from the procedure. She's looking forward to having the right-sided fixed as well. She never started the fenofibrate that was called in for her triglycerides. She has repeated her triglycerides fasting and they are significantly improved without intervention other than diet.   Past Medical History  Diagnosis Date  . Osteoporosis     s/p vertebroplasty for fracutres x 3    Past Surgical History  Procedure Laterality Date  . Bladder augmentation  1964  . Spine surgery  2010  . Spine surgery  2011  . Spine surgery  2012       The following portions of the patient's history were reviewed and updated as appropriate: Allergies, current medications, and problem list.    Review of Systems:   Patient denies headache, fevers, malaise, unintentional weight loss, skin rash, eye pain, sinus congestion and sinus pain, sore throat, dysphagia,  hemoptysis , cough, dyspnea, wheezing, chest pain, palpitations, orthopnea, edema, abdominal pain, nausea, melena, diarrhea, constipation, flank pain, dysuria, hematuria, urinary  Frequency, nocturia, numbness, tingling, seizures,  Focal weakness, Loss of consciousness,   Tremor, insomnia, depression,     History   Social History  . Marital Status: Widowed    Spouse Name: N/A    Number of Children: N/A  . Years of Education: N/A   Occupational History  . Not on file.   Social History Main Topics  . Smoking status: Never Smoker   . Smokeless tobacco: Never Used  . Alcohol Use: Yes  . Drug Use: No  . Sexually Active: Not on file   Other Topics Concern  . Not on file   Social History Narrative  . No narrative on file    Objective:  Filed Vitals:   09/26/12 0935  BP: 138/78  Pulse: 74  Temp: 97.8 F (36.6 C)  Resp: 14     General appearance: alert, cooperative and appears stated age Ears: normal TM's and external ear canals both ears Throat: lips, mucosa, and tongue normal; teeth and gums normal Neck: no adenopathy, no carotid bruit, supple, symmetrical, trachea midline and thyroid not enlarged, symmetric, no tenderness/mass/nodules Back: symmetric, no curvature. ROM normal. No CVA tenderness. Lungs: clear to auscultation bilaterally Heart: regular rate and rhythm, S1, S2 normal, no murmur, click, rub or gallop Abdomen: soft, non-tender; bowel sounds normal; no masses,  no organomegaly Pulses: 2+ on left, minimal on right ,  feet are well perfused  Skin: Skin color, texture, turgor normal. No rashes or lesions Lymph nodes: Cervical, supraclavicular, and axillary nodes normal.  Assessment and Plan:  PAD (peripheral artery disease) Had underwent a successful angioplasty on the left recently.  She is scheduled to  have the right side addressed in the very near future. She has noted improvement in her chronic pain since the stenosis was resolved.  She cannot tolerate aspirin due to history of nosebleeds.  HYPERLIPIDEMIA Her triglycerides have improved without addition of fenofibrate. It turns out that she never started the medication. her repeat triglycerides are now 187. Given her extreme age and do not favor starting her on statins  at this time  OSTEOARTHRITIS Her bilateral knee pain is multifactorial. Her left knee pain has improved with treatment of peripheral vascular disease. She is awaiting repeat procedure on the right to determine what else she would like to amend.   Updated Medication List Outpatient Encounter Prescriptions as of 09/26/2012  Medication Sig Dispense Refill  . ibuprofen (ADVIL,MOTRIN) 200 MG tablet Take 600 mg by mouth every 8 (eight) hours as needed. For pain       . OVER THE COUNTER MEDICATION Take 1 tablet by mouth every 6 (six) hours as needed. Antacid for stomach distress       . [DISCONTINUED] fenofibrate (TRICOR) 145 MG tablet Take 1 tablet (145 mg total) by mouth daily.  30 tablet  5   No facility-administered encounter medications on file as of 09/26/2012.     No orders of the defined types were placed in this encounter.    No Follow-up on file.

## 2012-09-26 NOTE — Assessment & Plan Note (Addendum)
Had underwent a successful angioplasty on the left recently.  She is scheduled to have the right side addressed in the very near future. She has noted improvement in her chronic pain since the stenosis was resolved.  She cannot tolerate aspirin due to history of nosebleeds.

## 2012-09-27 ENCOUNTER — Encounter: Payer: Self-pay | Admitting: Internal Medicine

## 2012-09-27 NOTE — Assessment & Plan Note (Signed)
Her triglycerides have improved without addition of fenofibrate. It turns out that she never started the medication. her repeat triglycerides are now 187. Given her extreme age and do not favor starting her on statins at this time

## 2012-09-27 NOTE — Assessment & Plan Note (Signed)
Her bilateral knee pain is multifactorial. Her left knee pain has improved with treatment of peripheral vascular disease. She is awaiting repeat procedure on the right to determine what else she would like to amend.

## 2012-09-29 ENCOUNTER — Ambulatory Visit: Payer: BC Managed Care – PPO | Admitting: Internal Medicine

## 2012-10-27 ENCOUNTER — Ambulatory Visit: Payer: Self-pay | Admitting: Vascular Surgery

## 2012-10-27 LAB — BUN: BUN: 26 mg/dL — ABNORMAL HIGH (ref 7–18)

## 2012-10-27 LAB — CREATININE, SERUM
EGFR (African American): >60
EGFR (Non-African Amer.): >60

## 2013-08-31 ENCOUNTER — Encounter: Payer: Self-pay | Admitting: Internal Medicine

## 2013-08-31 ENCOUNTER — Ambulatory Visit (INDEPENDENT_AMBULATORY_CARE_PROVIDER_SITE_OTHER): Payer: BC Managed Care – PPO | Admitting: Internal Medicine

## 2013-08-31 VITALS — BP 162/88 | HR 67 | Temp 97.8°F | Resp 16 | Ht <= 58 in | Wt 138.2 lb

## 2013-08-31 DIAGNOSIS — I739 Peripheral vascular disease, unspecified: Secondary | ICD-10-CM

## 2013-08-31 DIAGNOSIS — E785 Hyperlipidemia, unspecified: Secondary | ICD-10-CM

## 2013-08-31 NOTE — Assessment & Plan Note (Addendum)
Well controlled on red yeast rice once daily for years.  patient has refused statins . Will return for repeat lipids . Asa advised,

## 2013-08-31 NOTE — Patient Instructions (Addendum)
I recommend getting the majority of your calcium and Vitamin D  through diet rather than supplements given the recent association of calcium supplements with increased coronary artery calcium scores (You need 1200 mg daily )   Unsweetened almond/coconut milk is a great low calorie low carb, cholesterol free  way to increase your dietary calcium and vitamin D.   Try the [facility]  Brand  Available at [facility]   Try to take one baby aspirin per week to keep arteries open   Please return for fasting blood work at [facility]

## 2013-08-31 NOTE — Assessment & Plan Note (Addendum)
Advised her to take a baby aspirin daily but she had nosebleeds with daily use.  Willing to take one weekly. Needs return to [facility] for 6 month follow up Unclear how much of current symtoms a re driven by claudication vs OA>

## 2013-08-31 NOTE — Progress Notes (Signed)
Patient ID: Rebekah Bridges, female   DOB: [birth year], 79 y.o.   MRN: [id number]  Patient Active Problem List   Diagnosis Date Noted  . PAD (peripheral artery disease) 08/28/2012  . History of headache 08/28/2012  . HYPERLIPIDEMIA 01/24/2010  . OSTEOARTHRITIS 01/24/2010  . BACK PAIN 01/24/2010  . OSTEOPOROSIS 01/24/2010    Subjective:  CC:   Chief Complaint  Patient presents with  . Acute Visit    legs still burning and tingling    HPI:   Rebekah Bridges is a 80 y.o. female who presents for Follow up on PAD, hyperlipidemia, OA.  Last seen a year ago,  At which time she had undergone a vascular procedure on right leg to restore flow.  She had a procedure on the left leg in September, but her legs continue to be a source of claudication and arthritic pain with ambulation and she is  requesting a handicapped placard.   Other than leg pain she has no other complaints.  She was weaned herself down to two ibuprofen daily for unclear reasons other than "I don't want to be hooked on medications."  Denies abdominal pain, chest pain, headaches, and hip pain.    Past Medical History  Diagnosis Date  . Osteoporosis     s/p vertebroplasty for fracutres x 3    Past Surgical History  Procedure Laterality Date  . Bladder augmentation  1964  . Spine surgery  2010  . Spine surgery  2011  . Spine surgery  2012       The following portions of the patient's history were reviewed and updated as appropriate: Allergies, current medications, and problem list.    Review of Systems:   Patient denies headache, fevers, malaise, unintentional weight loss, skin rash, eye pain, sinus congestion and sinus pain, sore throat, dysphagia,  hemoptysis , cough, dyspnea, wheezing, chest pain, palpitations, orthopnea, edema, abdominal pain, nausea, melena, diarrhea, constipation, flank pain, dysuria, hematuria, urinary  Frequency, nocturia, numbness, tingling, seizures,  Focal weakness, Loss of consciousness,   Tremor, insomnia, depression, anxiety, and suicidal ideation.     History   Social History  . Marital Status: Widowed    Spouse Name: N/A    Number of Children: N/A  . Years of Education: N/A   Occupational History  . Not on file.   Social History Main Topics  . Smoking status: Never Smoker   . Smokeless tobacco: Never Used  . Alcohol Use: Yes  . Drug Use: No  . Sexual Activity: Not on file   Other Topics Concern  . Not on file   Social History Narrative  . No narrative on file    Objective:  Filed Vitals:   08/31/13 1836  BP: 162/88  Pulse: 67  Temp: 97.8 F (36.6 C)  Resp: 16     General appearance: alert, cooperative and appears stated age Ears: normal TM's and external ear canals both ears Throat: lips, mucosa, and tongue normal; teeth and gums normal Neck: no adenopathy, no carotid bruit, supple, symmetrical, trachea midline and thyroid not enlarged, symmetric, no tenderness/mass/nodules Back: symmetric, no curvature. ROM normal. No CVA tenderness. Lungs: clear to auscultation bilaterally Heart: regular rate and rhythm, S1, S2 normal, no murmur, click, rub or gallop Abdomen: soft, non-tender; bowel sounds normal; no masses,  no organomegaly Pulses: 2+ and symmetric Skin: Skin color, texture, turgor normal. No rashes or lesions Lymph nodes: Cervical, supraclavicular, and axillary nodes normal.  Assessment and Plan:  HYPERLIPIDEMIA Well controlled on  red yeast rice once daily for years.  patient has refused statins . Will return for repeat lipids . Asa advised,   PAD (peripheral artery disease) Advised her to take a baby aspirin daily but she had nosebleeds with daily use.  Willing to take one weekly. Needs return to [facility] for 6 month follow up Unclear how much of current symtoms a re driven by claudication vs OA>     Updated Medication List Outpatient Encounter Prescriptions as of 08/31/2013  Medication Sig  . ibuprofen (ADVIL,MOTRIN) 200 MG tablet  Take 600 mg by mouth every 8 (eight) hours as needed. For pain   . OVER THE COUNTER MEDICATION Take 1 tablet by mouth every 6 (six) hours as needed. Antacid for stomach distress      Orders Placed This Encounter  Procedures  . Ambulatory referral to Vascular Surgery    No Follow-up on file.

## 2013-08-31 NOTE — Progress Notes (Signed)
Pre-visit discussion using our clinic review tool. No additional management support is needed unless otherwise documented below in the visit note.  

## 2013-09-01 ENCOUNTER — Encounter: Payer: Self-pay | Admitting: Internal Medicine

## 2013-09-01 ENCOUNTER — Telehealth: Payer: Self-pay | Admitting: *Deleted

## 2013-09-01 DIAGNOSIS — E559 Vitamin D deficiency, unspecified: Secondary | ICD-10-CM

## 2013-09-01 DIAGNOSIS — R5383 Other fatigue: Principal | ICD-10-CM

## 2013-09-01 DIAGNOSIS — E785 Hyperlipidemia, unspecified: Secondary | ICD-10-CM

## 2013-09-01 DIAGNOSIS — R5381 Other malaise: Secondary | ICD-10-CM

## 2013-09-01 NOTE — Telephone Encounter (Signed)
Pt coming in tomorrow what labs and dx?  

## 2013-09-02 ENCOUNTER — Other Ambulatory Visit (INDEPENDENT_AMBULATORY_CARE_PROVIDER_SITE_OTHER): Payer: BC Managed Care – PPO

## 2013-09-02 DIAGNOSIS — E785 Hyperlipidemia, unspecified: Secondary | ICD-10-CM

## 2013-09-02 DIAGNOSIS — E559 Vitamin D deficiency, unspecified: Secondary | ICD-10-CM

## 2013-09-02 DIAGNOSIS — R5381 Other malaise: Secondary | ICD-10-CM

## 2013-09-02 DIAGNOSIS — R5383 Other fatigue: Principal | ICD-10-CM

## 2013-09-02 LAB — CBC WITH DIFFERENTIAL/PLATELET
BASOS ABS: 0 10*3/uL (ref 0.0–0.1)
Basophils Relative: 0.4 % (ref 0.0–3.0)
EOS PCT: 1.3 % (ref 0.0–5.0)
Eosinophils Absolute: 0.1 10*3/uL (ref 0.0–0.7)
HEMATOCRIT: 39.4 % (ref 36.0–46.0)
HEMOGLOBIN: 13 g/dL (ref 12.0–15.0)
LYMPHS ABS: 1.6 10*3/uL (ref 0.7–4.0)
LYMPHS PCT: 30.2 % (ref 12.0–46.0)
MCHC: 33 g/dL (ref 30.0–36.0)
MCV: 91 fl (ref 78.0–100.0)
MONOS PCT: 9.7 % (ref 3.0–12.0)
Monocytes Absolute: 0.5 10*3/uL (ref 0.1–1.0)
Neutro Abs: 3.1 10*3/uL (ref 1.4–7.7)
Neutrophils Relative %: 58.4 % (ref 43.0–77.0)
PLATELETS: 199 10*3/uL (ref 150.0–400.0)
RBC: 4.33 Mil/uL (ref 3.87–5.11)
RDW: 13.8 % (ref 11.5–15.5)
WBC: 5.3 10*3/uL (ref 4.0–10.5)

## 2013-09-03 LAB — LIPID PANEL
CHOLESTEROL: 188 mg/dL (ref 0–200)
HDL: 39 mg/dL — ABNORMAL LOW (ref >39.00)
LDL Cholesterol: 70 mg/dL (ref 0–99)
NONHDL: 149
Total CHOL/HDL Ratio: 5
Triglycerides: 395 mg/dL — ABNORMAL HIGH (ref 0.0–149.0)
VLDL: 79 mg/dL — AB (ref 0.0–40.0)

## 2013-09-03 LAB — VITAMIN D 25 HYDROXY (VIT D DEFICIENCY, FRACTURES): VITD: 34.95 ng/mL

## 2013-09-03 LAB — COMPREHENSIVE METABOLIC PANEL
ALBUMIN: 3.6 g/dL (ref 3.5–5.2)
ALT: 8 U/L (ref 0–35)
AST: 16 U/L (ref 0–37)
Alkaline Phosphatase: 100 U/L (ref 39–117)
BILIRUBIN TOTAL: 0.4 mg/dL (ref 0.2–1.2)
BUN: 25 mg/dL — ABNORMAL HIGH (ref 6–23)
CALCIUM: 9.5 mg/dL (ref 8.4–10.5)
CHLORIDE: 101 meq/L (ref 96–112)
CO2: 33 meq/L — AB (ref 19–32)
Creatinine, Ser: 0.7 mg/dL (ref 0.4–1.2)
GFR: 83.8 mL/min (ref >60.00)
GLUCOSE: 101 mg/dL — AB (ref 70–99)
Potassium: 4.1 mEq/L (ref 3.5–5.1)
SODIUM: 138 meq/L (ref 135–145)
TOTAL PROTEIN: 6.3 g/dL (ref 6.0–8.3)

## 2013-09-03 LAB — TSH: TSH: 1.52 u[IU]/mL (ref 0.35–4.50)

## 2013-09-07 ENCOUNTER — Encounter: Payer: Self-pay | Admitting: *Deleted

## 2013-11-05 ENCOUNTER — Ambulatory Visit (INDEPENDENT_AMBULATORY_CARE_PROVIDER_SITE_OTHER): Payer: BC Managed Care – PPO | Admitting: *Deleted

## 2013-11-05 DIAGNOSIS — Z23 Encounter for immunization: Secondary | ICD-10-CM

## 2014-01-28 ENCOUNTER — Ambulatory Visit (INDEPENDENT_AMBULATORY_CARE_PROVIDER_SITE_OTHER): Payer: BC Managed Care – PPO | Admitting: Internal Medicine

## 2014-01-28 ENCOUNTER — Encounter: Payer: Self-pay | Admitting: Internal Medicine

## 2014-01-28 VITALS — BP 148/90 | HR 72 | Temp 97.7°F | Resp 16 | Ht <= 58 in | Wt 137.5 lb

## 2014-01-28 DIAGNOSIS — Z79899 Other long term (current) drug therapy: Secondary | ICD-10-CM

## 2014-01-28 DIAGNOSIS — M5441 Lumbago with sciatica, right side: Secondary | ICD-10-CM

## 2014-01-28 DIAGNOSIS — M4806 Spinal stenosis, lumbar region: Secondary | ICD-10-CM

## 2014-01-28 DIAGNOSIS — I739 Peripheral vascular disease, unspecified: Secondary | ICD-10-CM

## 2014-01-28 DIAGNOSIS — G629 Polyneuropathy, unspecified: Secondary | ICD-10-CM

## 2014-01-28 DIAGNOSIS — Z23 Encounter for immunization: Secondary | ICD-10-CM

## 2014-01-28 DIAGNOSIS — M48062 Spinal stenosis, lumbar region with neurogenic claudication: Secondary | ICD-10-CM

## 2014-01-28 DIAGNOSIS — M5442 Lumbago with sciatica, left side: Secondary | ICD-10-CM

## 2014-01-28 LAB — COMPREHENSIVE METABOLIC PANEL
ALT: 6 U/L (ref 0–35)
AST: 19 U/L (ref 0–37)
Albumin: 3.7 g/dL (ref 3.5–5.2)
Alkaline Phosphatase: 107 U/L (ref 39–117)
BUN: 24 mg/dL — AB (ref 6–23)
CALCIUM: 9.3 mg/dL (ref 8.4–10.5)
CHLORIDE: 101 meq/L (ref 96–112)
CO2: 27 meq/L (ref 19–32)
CREATININE: 0.7 mg/dL (ref 0.4–1.2)
GFR: 88.14 mL/min (ref >60.00)
Glucose, Bld: 87 mg/dL (ref 70–99)
Potassium: 4.2 mEq/L (ref 3.5–5.1)
Sodium: 136 mEq/L (ref 135–145)
Total Bilirubin: 0.4 mg/dL (ref 0.2–1.2)
Total Protein: 7.2 g/dL (ref 6.0–8.3)

## 2014-01-28 LAB — VITAMIN B12: Vitamin B-12: 234 pg/mL (ref 211–911)

## 2014-01-28 LAB — HEMOGLOBIN A1C: Hgb A1c MFr Bld: 6.1 % (ref 4.6–6.5)

## 2014-01-28 MED ORDER — MELOXICAM 15 MG PO TABS: 15.0000 mg | ORAL_TABLET | Freq: Every day | ORAL | Status: DC

## 2014-01-28 MED ORDER — GABAPENTIN 100 MG PO CAPS: 100.0000 mg | ORAL_CAPSULE | Freq: Three times a day (TID) | ORAL | Status: DC

## 2014-01-28 NOTE — Patient Instructions (Addendum)
I am changing your ibuprofen to meloxicam for once daily use I am adding gabapentin for your pain and burning feet You can start with 1 gabapentin at bedtime and increase to either three times daily,  Or up to 3 tablets at bedtime.  Plain x rays have been ordered and the MRI will follow  Please keep your 6 month follow up appts with Dr Wyn Quakerew  If your pain is not improved in one week,  Let me know   Your blood pressure is elevated,  Please get it checked again this week and let me know the results

## 2014-01-28 NOTE — Progress Notes (Signed)
Patient ID: Rebekah Bridges, female   DOB: 05/21/1915, 80 y.o.   MRN: [id number]   Patient Active Problem List   Diagnosis Date Noted  . Back pain 01/30/2014  . Neuropathy 01/30/2014  . PAD (peripheral artery disease) 08/28/2012  . History of headache 08/28/2012  . HYPERLIPIDEMIA 01/24/2010  . OSTEOARTHRITIS 01/24/2010  . BACK PAIN 01/24/2010  . OSTEOPOROSIS 01/24/2010    Subjective:  CC:   Chief Complaint  Patient presents with  . Follow-up    taking Ibuprophen 6 times per day for pain all over.    HPI:   Rebekah Bridges is a 80 y.o. female who presents for follow up on chronic conditions including PAD, Last seen in July .  She is havinf daily debilitating pain in her lower back .  She has been talking motrin 6 tablets  daily.  She has a  History of vertebral fractures at multiple levels and has spinal stenosis by 2013 MRI,  With  Prior kyphoplasties,  As well as a history of peripheral  neuropathy     Past Medical History  Diagnosis Date  . Osteoporosis     s/p vertebroplasty for fracutres x 3    Past Surgical History  Procedure Laterality Date  . Bladder augmentation  1964  . Spine surgery  2010  . Spine surgery  2011  . Spine surgery  2012       The following portions of the patient's history were reviewed and updated as appropriate: Allergies, current medications, and problem list.    Review of Systems:   Patient denies headache, fevers, malaise, unintentional weight loss, skin rash, eye pain, sinus congestion and sinus pain, sore throat, dysphagia,  hemoptysis , cough, dyspnea, wheezing, chest pain, palpitations, orthopnea, edema, abdominal pain, nausea, melena, diarrhea, constipation, flank pain, dysuria, hematuria, urinary  Frequency, nocturia, numbness, tingling, seizures,  Focal weakness, Loss of consciousness,  Tremor, insomnia, depression, anxiety, and suicidal ideation.     History   Social History  . Marital Status: Widowed    Spouse Name: N/A    Number  of Children: N/A  . Years of Education: N/A   Occupational History  . Not on file.   Social History Main Topics  . Smoking status: Never Smoker   . Smokeless tobacco: Never Used  . Alcohol Use: Yes  . Drug Use: No  . Sexual Activity: Not on file   Other Topics Concern  . Not on file   Social History Narrative    Objective:  Filed Vitals:   01/28/14 0920  BP: 148/90  Pulse: 72  Temp: 97.7 F (36.5 C)  Resp: 16     General appearance: alert, cooperative and appears stated age Ears: normal TM's and external ear canals both ears Throat: lips, mucosa, and tongue normal; teeth and gums normal Neck: no adenopathy, no carotid bruit, supple, symmetrical, trachea midline and thyroid not enlarged, symmetric, no tenderness/mass/nodules Back: symmetric, no curvature. ROM normal. No CVA tenderness. Lungs: clear to auscultation bilaterally Heart: regular rate and rhythm, S1, S2 normal, no murmur, click, rub or gallop Abdomen: soft, non-tender; bowel sounds normal; no masses,  no organomegaly Pulses: 2+ and symmetric Skin: Skin color, texture, turgor normal. No rashes or lesions Lymph nodes: Cervical, supraclavicular, and axillary nodes normal.  Assessment and Plan:  Problem List Items Addressed This Visit      Cardiovascular and Mediastinum   PAD (peripheral artery disease)    Peripheral pules arenormal,  Feet are well perfused.  Relevant Medications      aspirin EC 81 MG tablet     Nervous and Auditory   Neuropathy    Metabolic causes ruled out.  Adding gabapentin     Relevant Orders      Vitamin B12 (Completed)      Hemoglobin A1c (Completed)      RPR (Completed)      T4 AND TSH (Completed)     Other   Back pain    With history of spinal stenosis and osteoporosis with prior  vertebral fractures.  Repeating MRI to rule out additional fractures.  ,  Adding vicodin for pain control    Relevant Medications      aspirin EC 81 MG tablet      meloxicam (MOBIC)  tablet    Other Visit Diagnoses    Spinal stenosis, lumbar region, with neurogenic claudication    -  Primary    Relevant Orders       DG Lumbar Spine Complete    Long-term use of high-risk medication        Relevant Orders       Comp Met (CMET) (Completed)    Need for prophylactic vaccination against Streptococcus pneumoniae (pneumococcus)        Relevant Orders       Pneumococcal conjugate vaccine 13-valent (Completed)

## 2014-01-29 ENCOUNTER — Telehealth: Payer: Self-pay | Admitting: Internal Medicine

## 2014-01-29 ENCOUNTER — Other Ambulatory Visit: Payer: Self-pay | Admitting: Internal Medicine

## 2014-01-29 LAB — T4 AND TSH
T4 TOTAL: 6.7 ug/dL (ref 4.5–12.0)
TSH: 5.1 u[IU]/mL — ABNORMAL HIGH (ref 0.450–4.500)

## 2014-01-29 LAB — RPR

## 2014-01-29 MED ORDER — HYDROCODONE-ACETAMINOPHEN 5-325 MG PO TABS: 1.0000 | ORAL_TABLET | Freq: Four times a day (QID) | ORAL | Status: DC | PRN

## 2014-01-29 NOTE — Telephone Encounter (Signed)
Pt son Rebekah Bridges([name]) stopped by the office to inform Dr. Darrick Huntsmanullo that the pt is still in a lot of pain and the medication that she was given in not working. Please advise pt or son.msn

## 2014-01-29 NOTE — Telephone Encounter (Signed)
I havre added vicodin bc she won't be able to get it over the weekend.  Son can come pick it up

## 2014-01-29 NOTE — Telephone Encounter (Signed)
Please advise, patient took one mobic and 2 gabapentin 01/28/14 and has done so again today and says she hurts so bad she cannot walk today and does not want to have to just sit.

## 2014-01-29 NOTE — Telephone Encounter (Signed)
Patient notified and script placed at front desk. 

## 2014-01-30 DIAGNOSIS — M549 Dorsalgia, unspecified: Secondary | ICD-10-CM | POA: Insufficient documentation

## 2014-01-30 DIAGNOSIS — G629 Polyneuropathy, unspecified: Secondary | ICD-10-CM | POA: Insufficient documentation

## 2014-01-30 NOTE — Assessment & Plan Note (Signed)
Metabolic causes ruled out.  Adding gabapentin

## 2014-01-30 NOTE — Assessment & Plan Note (Signed)
Peripheral pules arenormal,  Feet are well perfused.

## 2014-01-30 NOTE — Assessment & Plan Note (Signed)
With history of spinal stenosis and osteoporosis with prior  vertebral fractures.  Repeating MRI to rule out additional fractures.  ,  Adding vicodin for pain control

## 2014-01-31 ENCOUNTER — Encounter: Payer: Self-pay | Admitting: Internal Medicine

## 2014-02-01 NOTE — Telephone Encounter (Signed)
Called and ask patient if she took the medication on an empty stomach patient stated that she would just like to get the MRI pushed up that she knows something is wrong.

## 2014-02-03 ENCOUNTER — Ambulatory Visit (INDEPENDENT_AMBULATORY_CARE_PROVIDER_SITE_OTHER)
Admission: RE | Admit: 2014-02-03 | Discharge: 2014-02-03 | Disposition: A | Discharge status: Home / Self Care | Payer: BC Managed Care – PPO | Source: Ambulatory Visit | Attending: Internal Medicine | Admitting: Internal Medicine

## 2014-02-03 ENCOUNTER — Other Ambulatory Visit: Payer: Self-pay | Admitting: Internal Medicine

## 2014-02-03 DIAGNOSIS — M48062 Spinal stenosis, lumbar region with neurogenic claudication: Secondary | ICD-10-CM

## 2014-02-03 DIAGNOSIS — M4806 Spinal stenosis, lumbar region: Secondary | ICD-10-CM

## 2014-02-04 ENCOUNTER — Encounter: Payer: Self-pay | Admitting: Internal Medicine

## 2014-02-04 ENCOUNTER — Ambulatory Visit (INDEPENDENT_AMBULATORY_CARE_PROVIDER_SITE_OTHER): Payer: BC Managed Care – PPO | Admitting: Internal Medicine

## 2014-02-04 ENCOUNTER — Telehealth: Payer: Self-pay | Admitting: *Deleted

## 2014-02-04 VITALS — BP 158/78 | HR 70

## 2014-02-04 DIAGNOSIS — I10 Essential (primary) hypertension: Secondary | ICD-10-CM

## 2014-02-04 NOTE — Telephone Encounter (Signed)
Pt and her son came in today for nurse visit for BP check, and while in for the visit pt was very upset that she wasn't seeing Dr. Darrick Huntsmanullo. The son was not happy with the results of the email, per pt she was told to bring all medications to this visit to have someone go over with her, I read the email word for word and it didn't mention anything about bringing in all medications. Pt also wanted to see Dr. Darrick Huntsmanullo to discuss her spine x-ray and I advised per Dr. Darrick Huntsmanullo she would send the results via Mychart. Son wouldn't listen to anything I had to say and stated they would not schedule an appt with this office and what use was it for his mom to come here. He was also upset that pt was given 120 vicodin and she only took 3, again I advised the instructions take 1 q6h and she stated it didn't make any since. I offered an appt and they declined again.

## 2014-02-05 ENCOUNTER — Other Ambulatory Visit: Payer: Self-pay | Admitting: Internal Medicine

## 2014-02-05 DIAGNOSIS — M5431 Sciatica, right side: Secondary | ICD-10-CM

## 2014-02-05 DIAGNOSIS — M5432 Sciatica, left side: Principal | ICD-10-CM

## 2014-02-06 DIAGNOSIS — I10 Essential (primary) hypertension: Secondary | ICD-10-CM | POA: Insufficient documentation

## 2014-02-06 NOTE — Progress Notes (Signed)
Patient ID: Rebekah GarrisonRuth Pogats [place]street, female   DOB: December 05, 1915, 80 y.o.   MRN: [id number]

## 2014-02-06 NOTE — Assessment & Plan Note (Signed)
She has noted elevated BPs at home and returned for an EN visit to confirm. Will start amlodipine 5 mg daily.

## 2014-02-09 ENCOUNTER — Ambulatory Visit: Payer: Self-pay | Admitting: Internal Medicine

## 2014-02-10 ENCOUNTER — Telehealth: Payer: Self-pay | Admitting: Internal Medicine

## 2014-02-10 ENCOUNTER — Other Ambulatory Visit: Payer: Self-pay | Admitting: Internal Medicine

## 2014-02-10 DIAGNOSIS — M48061 Spinal stenosis, lumbar region without neurogenic claudication: Secondary | ICD-10-CM

## 2014-02-10 NOTE — Telephone Encounter (Signed)
Please call as she does not respond well to e mails.  MRI results of lumbar spine: there are no new fractures,  But at multiple levels the disks are bulging causing worsening of the spinal stenosis from L1 to L4.  She will need to see a neurosurgeon, whic hI will start the referral process for,  And I will increase her vicodin to every 4 hours if needed .  Since she received #120 she should have enough for a while; that's why i gave her so many   Does she have a preference who i send her to for the neurosurgery consult?

## 2014-02-10 NOTE — Telephone Encounter (Signed)
Referral is in process as requested to [facility] in [place].  Please let patient know that once we fax over the MRI and referral , they will call her with appt.  She may add tylenol 500 mg 4 times daily to the ibuprofen for added relief. I can also call in gabapentin to take up to 3 times daily if she would like to try it,  It is not a narcotic like vicodin or tramadol,  But it's  a medicine that helps nerve pain and may help her rest at night,

## 2014-02-10 NOTE — Telephone Encounter (Signed)
Spoke to patient to notify her of Dr. Melina Schoolsullo's comments. Patient verbalized understanding. Patient stated that she is not interested in trying the gabapentin at this time. What she is using is working for her and she will add tylenol if needed. She will call the office if she changes her mind. Right she will leave well enough alone.

## 2014-02-10 NOTE — Telephone Encounter (Signed)
Spoke to patient to notify her of Dr. Melina Schoolsullo's comments. Patient verbalized understanding. Patient does not have a preference who she see's she does not know anyone in neuro. Whom ever you prefer is fine with the patient. Patient also stated that she is not taking the vicodin at this time, she has been using ibuprofen 200mg  6 times daily.

## 2014-03-03 DIAGNOSIS — M48061 Spinal stenosis, lumbar region without neurogenic claudication: Secondary | ICD-10-CM | POA: Insufficient documentation

## 2014-03-16 ENCOUNTER — Encounter: Payer: Self-pay | Admitting: Internal Medicine

## 2014-03-16 DIAGNOSIS — M5136 Other intervertebral disc degeneration, lumbar region: Secondary | ICD-10-CM | POA: Insufficient documentation

## 2014-06-11 NOTE — Op Note (Signed)
PATIENT NAME:  Rebekah Bridges, Rebekah Bridges MR#:  [id number] DATE OF BIRTH:  01/28/1916  DATE OF PROCEDURE:  09/15/2012  PREOPERATIVE DIAGNOSIS: Peripheral arterial disease with claudication, bilateral lower extremities.   POSTOPERATIVE DIAGNOSIS: Peripheral arterial disease with claudication, bilateral lower extremities.  PROCEDURE: 1.  Ultrasound guidance for vascular access, right femoral artery.  2.  Catheter placement into left popliteal artery from right femoral approach.  3.  Aortogram and selective left lower extremity angiogram.  4.  Percutaneous transluminal angioplasty of the superficial femoral artery with 5 mm diameter angioplasty balloon.  5.  StarClose closure device, right femoral artery.   SURGEON: Annice NeedyJason S Dew, M.D.   ANESTHESIA: Local with moderate conscious sedation.   ESTIMATED BLOOD LOSS: Minimal.   INDICATION FOR PROCEDURE: A 81 year old white female, who presented the office with bilateral lower extremity claudication symptoms, although her ABI was lower on the right, her symptoms were worse on the left, and she desired intervention. The risks and benefits of the procedure were discussed and informed consent was obtained.   DESCRIPTION OF PROCEDURE: The patient was brought to the vascular interventional radiology suite. Groins were shaved and prepped and a sterile surgical field was created. Ultrasound was used to visualize a patent right femoral artery that was accessed without difficulty with Seldinger needle under direct ultrasound guidance and a permanent image was recorded. A 5-French sheath was placed. A pigtail catheter was placed in the aorta at the L1-L2 level, and AP aortogram was performed. This showed patent renal arteries bilaterally, patent aorta and iliac segments without flow-limiting stenosis. I then hooked the aortic bifurcation with the pigtail catheter and advanced to the left femoral head. Selective left lower extremity angiogram was then performed. This showed a  moderate stenosis in the proximal superficial femoral artery over about 5 cm area in the 60% range. The remainder of the SFA had minimal stenosis. The popliteal had an approximately 30% stenosis. Initially, there appeared to be a lesion of tibioperoneal trunk with 2 vessel runoff. The patient was heparinized. A 6-French Ansell sheath was placed over a Terumo advantage wire and I crossed the SFA lesion with the Terumo advantage wire and a Kumpe catheter and advanced to the popliteal artery. This allowed us to image the tibioperoneal trunk and tibial lesion. This lesion in the tibioperoneal trunk appeared to be in the 30% to 40% range and not flow limiting and there was 2 vessel runoff distally. At this point, I treated the SFA lesion with a 5 mm diameter angioplasty balloon. A waist was taken, which resolved with angioplasty and completion angiogram showed an approximately 20% residual stenosis that was not flow limiting. At this point, I elected to terminate the procedure. The sheath was pulled back to the ipsilateral external iliac artery and oblique arteriogram was performed. A StarClose closure device was deployed in the usual fashion with an excellent hemostatic result. The patient tolerated the procedure well and was taken to the recovery room in stable condition.    ____________________________ Annice NeedyJason S. Dew, MD jsd:aw D: 09/15/2012 10:40:20 ET T: 09/15/2012 10:52:46 ET JOB#: [id number]  cc: Annice NeedyJason S. Dew, MD, <Dictator> Duncan Dulleresa Tullo, MD Annice NeedyJASON S DEW MD ELECTRONICALLY SIGNED 09/15/2012 12:30

## 2014-06-11 NOTE — Op Note (Signed)
PATIENT NAME:  Rebekah Bridges, [name] MR#:  [id number] DATE OF BIRTH:  August 13, 1915  DATE OF PROCEDURE:  10/27/2012  PREOPERATIVE DIAGNOSES: 1.  Peripheral arterial disease with claudication, right lower extremity.  2.  Status post left lower extremity revascularization for claudication.  3.  Hyperlipidemia.   POSTOPERATIVE DIAGNOSES: 1.  Peripheral arterial disease with claudication, right lower extremity.  2.  Status post left lower extremity revascularization for claudication.  3.  Hyperlipidemia.   PROCEDURES: 1.  Ultrasound guidance for vascular access, left femoral artery.  2.  Catheter placement to right popliteal artery from left femoral approach.  3.  Aortogram and selective right lower extremity angiogram.  4.  Percutaneous transluminal angioplasty of distal right superficial femoral artery and above-knee popliteal artery with 5 mm diameter angioplasty balloon.  5.  StarClose closure device, left femoral artery.   SURGEON: Annice NeedyJason S. Khalik Pewitt, M.D.   ANESTHESIA: Local with moderate conscious sedation.   ESTIMATED BLOOD LOSS: 25 mL.   INDICATION FOR PROCEDURE: This is 81 year old very functional female who has significant peripheral arterial disease and claudication of both lower extremities. She has already undergone revascularization of the left lower extremity. She still has short distance lifestyle-limiting claudication of the right lower extremity and desires intervention.   DESCRIPTION OF PROCEDURE: The patient is brought to the vascular interventional radiology suite. Groins were shaved and prepped and a sterile surgical field was created. Ultrasound was used to visualize a patent left femoral artery and the left femoral artery was accessed under direct ultrasound guidance without difficulty with a Seldinger needle. A J-wire and 5-French sheath were then placed.  A pigtail catheter was placed in the aorta at the L1-L2 level, and AP aortogram was performed. This showed no changes from the  previous aortogram with patent aorta and iliac segments and patent renal arteries. Hypoplastic areas are present which limits visualization somewhat. I then hooked the aortic bifurcation and advanced to the right femoral head and selective right lower extremity angiogram was then performed. This showed a normal common femoral artery, profunda femoris artery. The superficial femoral artery was patent proximally; however, in its distal segment there were 2 areas of short segment occlusion going down into the above-knee popliteal artery that were within about 8 to 10 cm from one another. The popliteal artery was then patent, and the anterior tibial artery was the dominant runoff to the foot. The patient was systemically heparinized and a 6-French Ansel sheath was placed over a Terumo Advantage wire. I was able to easily cross the lesion without difficulty with the Terumo Advantage wire and Kumpe catheter and confirm intraluminal flow in the popliteal artery after re-entry. I then replaced the wire. A single 5 mm diameter x 15 cm length  angioplasty balloon was used to encompass both lesions. Waists were taken which resolved with angioplasty, and completion angiogram following angioplasty showed markedly improved flow with no greater than 20% residual stenosis and maintained runoff distally. For this reason, I elected not to place a stent due to good angiographic imaging with angioplasty balloon. I pulled the sheath back to the ipsilateral external iliac artery and oblique arteriogram was performed. StarClose closure device was deployed in the usual fashion with excellent hemostatic result. The patient tolerated the procedure well and was taken to the recovery room in stable condition.        ____________________________ Annice NeedyJason S. Melessa Cowell, MD jsd:dmm D: 10/27/2012 11:10:13 ET T: 10/27/2012 11:59:12 ET JOB#: [id number]  cc: Annice NeedyJason S. Emmaline Wahba, MD, <Dictator> Duncan Dulleresa Tullo, MD  Annice Needy MD ELECTRONICALLY SIGNED 11/12/2012  11:37

## 2015-01-07 DIAGNOSIS — G8929 Other chronic pain: Secondary | ICD-10-CM | POA: Insufficient documentation

## 2018-11-05 DIAGNOSIS — Z6826 Body mass index (BMI) 26.0-26.9, adult: Secondary | ICD-10-CM | POA: Insufficient documentation

## 2018-11-05 DIAGNOSIS — M461 Sacroiliitis, not elsewhere classified: Secondary | ICD-10-CM | POA: Insufficient documentation

## 2018-11-05 DIAGNOSIS — R030 Elevated blood-pressure reading, without diagnosis of hypertension: Secondary | ICD-10-CM | POA: Insufficient documentation

## 2019-01-23 DIAGNOSIS — G629 Polyneuropathy, unspecified: Secondary | ICD-10-CM | POA: Insufficient documentation

## 2019-07-17 ENCOUNTER — Emergency Department: Payer: Medicare Other

## 2019-07-17 ENCOUNTER — Other Ambulatory Visit: Payer: Self-pay

## 2019-07-17 ENCOUNTER — Inpatient Hospital Stay
Admission: EM | Admit: 2019-07-17 | Discharge: 2019-07-19 | DRG: 440 | Disposition: A | Discharge status: Home / Self Care | Payer: Medicare Other | Attending: Hospitalist | Admitting: Hospitalist

## 2019-07-17 ENCOUNTER — Encounter: Payer: Self-pay | Admitting: Emergency Medicine

## 2019-07-17 ENCOUNTER — Inpatient Hospital Stay (HOSPITAL_COMMUNITY)
Admit: 2019-07-17 | Discharge: 2019-07-17 | Disposition: A | Discharge status: Home / Self Care | Payer: Medicare Other | Attending: Internal Medicine | Admitting: Internal Medicine

## 2019-07-17 DIAGNOSIS — M810 Age-related osteoporosis without current pathological fracture: Secondary | ICD-10-CM | POA: Diagnosis present

## 2019-07-17 DIAGNOSIS — Z87310 Personal history of (healed) osteoporosis fracture: Secondary | ICD-10-CM | POA: Diagnosis not present

## 2019-07-17 DIAGNOSIS — K859 Acute pancreatitis without necrosis or infection, unspecified: Secondary | ICD-10-CM

## 2019-07-17 DIAGNOSIS — K8020 Calculus of gallbladder without cholecystitis without obstruction: Secondary | ICD-10-CM | POA: Diagnosis present

## 2019-07-17 DIAGNOSIS — I4891 Unspecified atrial fibrillation: Secondary | ICD-10-CM

## 2019-07-17 DIAGNOSIS — I739 Peripheral vascular disease, unspecified: Secondary | ICD-10-CM | POA: Diagnosis present

## 2019-07-17 DIAGNOSIS — G629 Polyneuropathy, unspecified: Secondary | ICD-10-CM | POA: Diagnosis present

## 2019-07-17 DIAGNOSIS — E785 Hyperlipidemia, unspecified: Secondary | ICD-10-CM | POA: Diagnosis present

## 2019-07-17 DIAGNOSIS — K851 Biliary acute pancreatitis without necrosis or infection: Principal | ICD-10-CM | POA: Diagnosis present

## 2019-07-17 DIAGNOSIS — M1990 Unspecified osteoarthritis, unspecified site: Secondary | ICD-10-CM | POA: Diagnosis present

## 2019-07-17 DIAGNOSIS — I10 Essential (primary) hypertension: Secondary | ICD-10-CM | POA: Diagnosis present

## 2019-07-17 DIAGNOSIS — I480 Paroxysmal atrial fibrillation: Secondary | ICD-10-CM

## 2019-07-17 DIAGNOSIS — R1011 Right upper quadrant pain: Secondary | ICD-10-CM

## 2019-07-17 DIAGNOSIS — Z20822 Contact with and (suspected) exposure to covid-19: Secondary | ICD-10-CM | POA: Diagnosis present

## 2019-07-17 DIAGNOSIS — Z882 Allergy status to sulfonamides status: Secondary | ICD-10-CM | POA: Diagnosis not present

## 2019-07-17 DIAGNOSIS — Z791 Long term (current) use of non-steroidal anti-inflammatories (NSAID): Secondary | ICD-10-CM

## 2019-07-17 DIAGNOSIS — Z7982 Long term (current) use of aspirin: Secondary | ICD-10-CM | POA: Diagnosis not present

## 2019-07-17 DIAGNOSIS — Z880 Allergy status to penicillin: Secondary | ICD-10-CM | POA: Diagnosis not present

## 2019-07-17 HISTORY — DX: Paroxysmal atrial fibrillation: I48.0

## 2019-07-17 LAB — TROPONIN I (HIGH SENSITIVITY)
Troponin I (High Sensitivity): 30 ng/L — ABNORMAL HIGH (ref <18)
Troponin I (High Sensitivity): 36 ng/L — ABNORMAL HIGH (ref <18)

## 2019-07-17 LAB — URINALYSIS, COMPLETE (UACMP) WITH MICROSCOPIC
Bacteria, UA: NONE SEEN
Bilirubin Urine: NEGATIVE
Glucose, UA: NEGATIVE mg/dL
Hgb urine dipstick: NEGATIVE
Ketones, ur: NEGATIVE mg/dL
Leukocytes,Ua: NEGATIVE
Nitrite: NEGATIVE
Protein, ur: 30 mg/dL — AB
Specific Gravity, Urine: 1.013 (ref 1.005–1.030)
Squamous Epithelial / HPF: NONE SEEN (ref 0–5)
pH: 7 (ref 5.0–8.0)

## 2019-07-17 LAB — COMPREHENSIVE METABOLIC PANEL
ALT: 81 U/L — ABNORMAL HIGH (ref 0–44)
AST: 253 U/L — ABNORMAL HIGH (ref 15–41)
Albumin: 3.9 g/dL (ref 3.5–5.0)
Alkaline Phosphatase: 105 U/L (ref 38–126)
Anion gap: 10 (ref 5–15)
BUN: 24 mg/dL — ABNORMAL HIGH (ref 8–23)
CO2: 29 mmol/L (ref 22–32)
Calcium: 8.8 mg/dL — ABNORMAL LOW (ref 8.9–10.3)
Chloride: 100 mmol/L (ref 98–111)
Creatinine, Ser: 0.62 mg/dL (ref 0.44–1.00)
GFR calc Af Amer: >60 mL/min (ref >60)
GFR calc non Af Amer: >60 mL/min (ref >60)
Glucose, Bld: 140 mg/dL — ABNORMAL HIGH (ref 70–99)
Potassium: 4 mmol/L (ref 3.5–5.1)
Sodium: 139 mmol/L (ref 135–145)
Total Bilirubin: 1.1 mg/dL (ref 0.3–1.2)
Total Protein: 7.2 g/dL (ref 6.5–8.1)

## 2019-07-17 LAB — ECHOCARDIOGRAM COMPLETE
Height: 59 in
Weight: 2080 oz

## 2019-07-17 LAB — CBC WITH DIFFERENTIAL/PLATELET
Abs Immature Granulocytes: 0.03 10*3/uL (ref 0.00–0.07)
Basophils Absolute: 0 10*3/uL (ref 0.0–0.1)
Basophils Relative: 0 %
Eosinophils Absolute: 0 10*3/uL (ref 0.0–0.5)
Eosinophils Relative: 0 %
HCT: 41.4 % (ref 36.0–46.0)
Hemoglobin: 13.9 g/dL (ref 12.0–15.0)
Immature Granulocytes: 0 %
Lymphocytes Relative: 4 %
Lymphs Abs: 0.3 10*3/uL — ABNORMAL LOW (ref 0.7–4.0)
MCH: 31.8 pg (ref 26.0–34.0)
MCHC: 33.6 g/dL (ref 30.0–36.0)
MCV: 94.7 fL (ref 80.0–100.0)
Monocytes Absolute: 0.3 10*3/uL (ref 0.1–1.0)
Monocytes Relative: 4 %
Neutro Abs: 7.3 10*3/uL (ref 1.7–7.7)
Neutrophils Relative %: 92 %
Platelets: 117 10*3/uL — ABNORMAL LOW (ref 150–400)
RBC: 4.37 MIL/uL (ref 3.87–5.11)
RDW: 13.1 % (ref 11.5–15.5)
WBC: 8 10*3/uL (ref 4.0–10.5)
nRBC: 0 % (ref 0.0–0.2)

## 2019-07-17 LAB — SARS CORONAVIRUS 2 (TAT 6-24 HRS): SARS Coronavirus 2: NEGATIVE

## 2019-07-17 LAB — MAGNESIUM: Magnesium: 1.6 mg/dL — ABNORMAL LOW (ref 1.7–2.4)

## 2019-07-17 LAB — LIPASE, BLOOD: Lipase: 2035 U/L — ABNORMAL HIGH (ref 11–51)

## 2019-07-17 MED ORDER — ASPIRIN EC 81 MG PO TBEC
81.0000 mg | DELAYED_RELEASE_TABLET | Freq: Every day | ORAL | Status: DC
  Filled 2019-07-17 (×2): qty 1

## 2019-07-17 MED ORDER — ENOXAPARIN SODIUM 40 MG/0.4ML ~~LOC~~ SOLN: 30.0000 mg | SUBCUTANEOUS | Status: DC

## 2019-07-17 MED ORDER — DILTIAZEM LOAD VIA INFUSION: 10.0000 mg | Freq: Once | INTRAVENOUS | Status: DC

## 2019-07-17 MED ORDER — MAGNESIUM SULFATE 2 GM/50ML IV SOLN
2.0000 g | Freq: Once | INTRAVENOUS | Status: AC
  Administered 2019-07-17: 2 g via INTRAVENOUS
  Filled 2019-07-17: qty 50

## 2019-07-17 MED ORDER — HYDRALAZINE HCL 20 MG/ML IJ SOLN
10.0000 mg | Freq: Four times a day (QID) | INTRAMUSCULAR | Status: DC | PRN
  Administered 2019-07-19: 10 mg via INTRAVENOUS
  Filled 2019-07-17 (×2): qty 0.5

## 2019-07-17 MED ORDER — MORPHINE SULFATE (PF) 2 MG/ML IV SOLN
2.0000 mg | Freq: Once | INTRAVENOUS | Status: AC
  Administered 2019-07-17: 2 mg via INTRAVENOUS
  Filled 2019-07-17: qty 1

## 2019-07-17 MED ORDER — DILTIAZEM LOAD VIA INFUSION
10.0000 mg | Freq: Once | INTRAVENOUS | Status: DC
  Filled 2019-07-17: qty 10

## 2019-07-17 MED ORDER — ONDANSETRON HCL 4 MG PO TABS: 4.0000 mg | ORAL_TABLET | Freq: Four times a day (QID) | ORAL | Status: DC | PRN

## 2019-07-17 MED ORDER — DILTIAZEM HCL-DEXTROSE 125-5 MG/125ML-% IV SOLN (PREMIX): 5.0000 mg/h | INTRAVENOUS | Status: DC

## 2019-07-17 MED ORDER — ONDANSETRON HCL 4 MG/2ML IJ SOLN: 4.0000 mg | Freq: Four times a day (QID) | INTRAMUSCULAR | Status: DC | PRN

## 2019-07-17 MED ORDER — DILTIAZEM HCL-DEXTROSE 125-5 MG/125ML-% IV SOLN (PREMIX)
5.0000 mg/h | INTRAVENOUS | Status: DC
  Filled 2019-07-17: qty 125

## 2019-07-17 MED ORDER — SODIUM CHLORIDE 0.9 % IV SOLN: INTRAVENOUS | Status: DC

## 2019-07-17 MED ORDER — IOHEXOL 300 MG/ML  SOLN
75.0000 mL | Freq: Once | INTRAMUSCULAR | Status: AC | PRN
  Administered 2019-07-17: 75 mL via INTRAVENOUS

## 2019-07-17 MED ORDER — LACTATED RINGERS IV BOLUS
500.0000 mL | Freq: Once | INTRAVENOUS | Status: AC
  Administered 2019-07-17: 500 mL via INTRAVENOUS

## 2019-07-17 MED ORDER — GABAPENTIN 100 MG PO CAPS
100.0000 mg | ORAL_CAPSULE | Freq: Three times a day (TID) | ORAL | Status: DC
  Filled 2019-07-17 (×3): qty 1

## 2019-07-17 MED ORDER — PANTOPRAZOLE SODIUM 40 MG IV SOLR
40.0000 mg | INTRAVENOUS | Status: DC
  Administered 2019-07-17 – 2019-07-18 (×2): 40 mg via INTRAVENOUS
  Filled 2019-07-17 (×2): qty 40

## 2019-07-17 MED ORDER — MORPHINE SULFATE (PF) 2 MG/ML IV SOLN: 2.0000 mg | INTRAVENOUS | Status: DC | PRN

## 2019-07-17 NOTE — ED Triage Notes (Signed)
Pt arrived via [facility] from [facility] c/o emesis since last night. Pt stated emesis went on throughout night, pt estimated 10 episodes. Pt stated she has a glass of wine on an empty stomach. Pt c/o back pain and has hx of fractured vertebrae w/ emesis. Pt received zofran during trx.

## 2019-07-17 NOTE — ED Notes (Signed)
Pt called out to use the bathroom, pt placed on pure wick by [employer]

## 2019-07-17 NOTE — Progress Notes (Signed)
*  PRELIMINARY RESULTS* Echocardiogram 2D Echocardiogram has been performed.  Rebekah Bridges 07/17/2019, 2:22 PM

## 2019-07-17 NOTE — H&P (Signed)
History and Physical    Rebekah Bridges YJE:[id number] DOB: [birth year] DOA: 07/17/2019  PCP: Sherlene Shams, MD   Patient coming from: Home  I have personally briefly reviewed patient's old medical records in [facility]  Chief Complaint: Emesis  HPI: Rebekah Bridges is a 86 y.o. female with medical history significant for hypertension, peripheral arterial disease and dyslipidemia who presents to the emergency room for evaluation of vomiting and back pain.  Patient reported that she had a small amount of wine after dinner last night and overnight developed nausea with multiple episodes of nonbilious, nonbloody emesis.  Associated with emesis was diffuse pain across her abdomen radiating to her back.  She rated her pain as moderate to severe with an intensity of 6 x 10.  Patient states that pain feels similar to the pain she had when she fractured her back. She denies having any fever or chills, no dizziness or lightheadedness.  No urinary symptoms or changes in her bowel habits. She was noted to be in atrial fibrillation when EMS arrived but denies having history of this.  She converted to sinus rhythm in the ER. Labs revealed significantly elevated lipase levels greater than 2000 CT scan of abdomen and pelvis shows mild haziness of the peripancreatic fat in the pancreatic head and body, suggesting acute non-necrotizing pancreatitis. No pancreatic mass or duct dilation. No biliary ductal dilatation. Patient also noted to have cholelithiasis  ED Course: Elderly female who presents to the emergency room for evaluation of emesis and found to have acute pancreatitis.  She will be admitted to the hospital for further evaluation.  Review of Systems: As per HPI otherwise 10 point review of systems negative.     Past Medical History:  Diagnosis Date  . Osteoporosis    s/p vertebroplasty for fracutres x 3    Past Surgical History:  Procedure Laterality Date  . BLADDER AUGMENTATION   1964  . SPINE SURGERY  2010  . SPINE SURGERY  2011  . SPINE SURGERY  2012     reports that she has never smoked. She has never used smokeless tobacco. She reports current alcohol use. She reports that she does not use drugs.  Allergies  Allergen Reactions  . Penicillins Nausea Only    And a rash  . Sulfa Antibiotics Nausea Only    And rash  . Tramadol Hcl Other (See Comments)    REACTION: nausea and sweating    Family History  Problem Relation Age of Onset  . Cancer Father   . Learning disabilities Paternal Grandmother      Prior to Admission medications   Medication Sig Start Date End Date Taking? Authorizing Provider  aspirin EC 81 MG tablet Take 81 mg by mouth daily.    [provider]  gabapentin (NEURONTIN) 100 MG capsule Take 1 capsule (100 mg total) by mouth 3 (three) times daily. 01/28/14   Sherlene Shams, MD  HYDROcodone-acetaminophen (NORCO/VICODIN) 5-325 MG per tablet Take 1 tablet by mouth every 6 (six) hours as needed for moderate pain. 01/29/14   Sherlene Shams, MD  meloxicam (MOBIC) 15 MG tablet Take 1 tablet (15 mg total) by mouth daily. 01/28/14   Sherlene Shams, MD  OVER THE COUNTER MEDICATION Take 1 tablet by mouth every 6 (six) hours as needed. Antacid for stomach distress     [provider]    Physical Exam: Vitals:   07/17/19 0726 07/17/19 0800 07/17/19 0830 07/17/19 1126  BP:  Marland Kitchen)  182/94 (!) 175/83 (!) 153/76  Pulse:  96 89 86  Resp:  18 19 19   Temp:      TempSrc:      SpO2:  100% 100% 100%  Weight: 59 kg     Height: 4\' 11"  (1.499 m)        Vitals:   07/17/19 0726 07/17/19 0800 07/17/19 0830 07/17/19 1126  BP:  (!) 182/94 (!) 175/83 (!) 153/76  Pulse:  96 89 86  Resp:  18 19 19   Temp:      TempSrc:      SpO2:  100% 100% 100%  Weight: 59 kg     Height: 4\' 11"  (1.499 m)       Constitutional: NAD, alert and oriented x 3 Eyes: PERRL, lids and conjunctivae normal ENMT: Mucous membranes are moist.  Neck: normal,  supple, no masses, no thyromegaly Respiratory: clear to auscultation bilaterally, no wheezing, no crackles. Normal respiratory effort. No accessory muscle use.  Cardiovascular: Tachycardic, no murmurs / rubs / gallops. No extremity edema. 2+ pedal pulses. No carotid bruits.  Abdomen: tenderness in the epigastrium, no masses palpated. No hepatosplenomegaly. Bowel sounds positive.  Musculoskeletal: no clubbing / cyanosis. No joint deformity upper and lower extremities.  Skin: no rashes, lesions, ulcers.  Neurologic: No gross focal neurologic deficit. Psychiatric: Normal mood and affect.   Labs on Admission: I have personally reviewed following labs and imaging studies  CBC: Recent Labs  Lab 07/17/19 0732  WBC 8.0  NEUTROABS 7.3  HGB 13.9  HCT 41.4  MCV 94.7  PLT 117*   Basic Metabolic Panel: Recent Labs  Lab 07/17/19 0732  NA 139  K 4.0  CL 100  CO2 29  GLUCOSE 140*  BUN 24*  CREATININE 0.62  CALCIUM 8.8*  MG 1.6*   GFR: Estimated Creatinine Clearance: 27.8 mL/min (by C-G formula based on SCr of 0.62 mg/dL). Liver Function Tests: Recent Labs  Lab 07/17/19 0732  AST 253*  ALT 81*  ALKPHOS 105  BILITOT 1.1  PROT 7.2  ALBUMIN 3.9   Recent Labs  Lab 07/17/19 0732  LIPASE 2,035*   No results for input(s): AMMONIA in the last 168 hours. Coagulation Profile: No results for input(s): INR, PROTIME in the last 168 hours. Cardiac Enzymes: No results for input(s): CKTOTAL, CKMB, CKMBINDEX, TROPONINI in the last 168 hours. BNP (last 3 results) No results for input(s): PROBNP in the last 8760 hours. HbA1C: No results for input(s): HGBA1C in the last 72 hours. CBG: No results for input(s): GLUCAP in the last 168 hours. Lipid Profile: No results for input(s): CHOL, HDL, LDLCALC, TRIG, CHOLHDL, LDLDIRECT in the last 72 hours. Thyroid Function Tests: No results for input(s): TSH, T4TOTAL, FREET4, T3FREE, THYROIDAB in the last 72 hours. Anemia Panel: No results for  input(s): VITAMINB12, FOLATE, FERRITIN, TIBC, IRON, RETICCTPCT in the last 72 hours. Urine analysis:    Component Value Date/Time   COLORURINE YELLOW (A) 07/17/2019 0732   APPEARANCEUR HAZY (A) 07/17/2019 0732   LABSPEC 1.013 07/17/2019 0732   PHURINE 7.0 07/17/2019 0732   GLUCOSEU NEGATIVE 07/17/2019 0732   HGBUR NEGATIVE 07/17/2019 0732   BILIRUBINUR NEGATIVE 07/17/2019 0732   KETONESUR NEGATIVE 07/17/2019 0732   PROTEINUR 30 (A) 07/17/2019 0732   NITRITE NEGATIVE 07/17/2019 0732   LEUKOCYTESUR NEGATIVE 07/17/2019 0732    Radiological Exams on Admission: CT Abdomen Pelvis W Contrast  Result Date: 07/17/2019 CLINICAL DATA:  Abdominal pain and vomiting. EXAM: CT ABDOMEN AND PELVIS WITH CONTRAST TECHNIQUE: Multidetector  CT imaging of the abdomen and pelvis was performed using the standard protocol following bolus administration of intravenous contrast. CONTRAST:  68mL OMNIPAQUE IOHEXOL 300 MG/ML  SOLN COMPARISON:  None. FINDINGS: Lower chest: Platelike scarring versus atelectasis at the dependent lung bases bilaterally. Coronary atherosclerosis. Hepatobiliary: Normal liver size. Scattered granulomatous small liver calcifications. No liver masses. Tiny layering gallstones in gallbladder. No definite gallbladder wall thickening. No pericholecystic fluid. No biliary ductal dilatation. Pancreas: Mild haziness of the peripancreatic fat in the pancreatic head and body. No pancreatic mass or duct dilation. Spleen: Normal size spleen. Granulomatous splenic calcifications. No splenic masses. Adrenals/Urinary Tract: Normal adrenals. No hydronephrosis. Simple 1.2 cm renal cortical cyst in the medial lower left kidney. Small parapelvic simple right renal cysts. Normal bladder. Stomach/Bowel: Small hiatal hernia. Otherwise normal nondistended stomach. Normal caliber small bowel with no small bowel wall thickening. Normal appendix. Mild cecal diverticulosis. No large bowel wall thickening or significant  pericolonic fat stranding. Vascular/Lymphatic: Atherosclerotic nonaneurysmal abdominal aorta. Patent portal, splenic, hepatic and renal veins. No pathologically enlarged lymph nodes in the abdomen or pelvis. Reproductive: Coarsely calcified small posterior uterine fibroid. Atrophic uterus. Endometrium indistinct and not accurately evaluated on this CT study. No adnexal masses. Other: No pneumoperitoneum, ascites or focal fluid collection. Musculoskeletal: No aggressive appearing focal osseous lesions. Diffuse osteopenia. Mild to moderate T11, T12, L2 and L3 vertebral compression fracture status post vertebroplasty. Marked lumbar spondylosis. IMPRESSION: 1. Mild haziness of the peripancreatic fat in the pancreatic head and body, suggesting acute non-necrotizing pancreatitis. No pancreatic mass or duct dilation. No biliary ductal dilatation. Suggest correlation with serum lipase. 2. Cholelithiasis. 3. Small hiatal hernia. 4. Mild cecal diverticulosis. 5. Diffuse osteopenia. 6. Aortic Atherosclerosis (ICD10-I70.0). Electronically Signed   By: Ilona Sorrel M.D.   On: 07/17/2019 09:43   US Abdomen Limited RUQ  Result Date: 07/17/2019 CLINICAL DATA:  Upper abdominal pain.  Nausea. EXAM: ULTRASOUND ABDOMEN LIMITED RIGHT UPPER QUADRANT COMPARISON:  CT abdomen and pelvis Jul 17, 2019 FINDINGS: Gallbladder: There is sludge in the gallbladder. No calculi are appreciable by ultrasound in the gallbladder. No gallbladder wall thickening or pericholecystic fluid. No sonographic Murphy sign noted by sonographer. Common bile duct: Diameter: 5 mm. No intrahepatic or extrahepatic biliary duct dilatation. Liver: There is an apparent calcified granuloma in the right lobe of the liver, also present on CT. Liver echogenicity overall is somewhat increased. Portal vein is patent on color Doppler imaging with normal direction of blood flow towards the liver. Other: None. IMPRESSION: 1. There is sludge in the gallbladder. No gallstones  are evident on this study. Tiny gallstones could be intermingled with sludge. No gallbladder wall thickening or pericholecystic fluid. 2. Small granuloma in the right lobe of the liver. There is a degree of increased liver echogenicity which may represent underlying fatty infiltration in the liver. No focal liver lesions identified beyond the small calcification in the right lobe. Electronically Signed   By: Lowella Grip III M.D.   On: 07/17/2019 11:21    EKG: Independently reviewed.  Sinus tachycardia  Assessment/Plan Principal Problem:   Acute pancreatitis Active Problems:   PAD (peripheral artery disease) (HCC)   Essential hypertension   Unspecified atrial fibrillation (HCC)    Acute pancreatitis  Unclear etiology Probably related to alcohol ingestion but will obtain lipid panel Keep patient n.p.o.  supportive care with antiemetics, IV pain medication and IV fluid hydration   Hypertension Will place patient on IV hydralazine to optimize blood pressure control   Unspecified atrial fibrillation  Patient was noted to be in rapid atrial fibrillation in the field but converted to sinus rhythm in the emergency room without any intervention Will obtain 2D echocardiogram Monitor closely during this hospitalization   DVT prophylaxis: Lovenox Code Status: Full Family Communication: Greater than 50% of time was spent discussing plan of care with patient at the bedside.  All questions and concerns have been addressed. Disposition Plan: Back to previous home environment Consults called: None     Deaundra Kutzer MD [facility]     07/17/2019, 11:32 AM

## 2019-07-17 NOTE — ED Notes (Signed)
Pt trx to CT.  

## 2019-07-17 NOTE — ED Notes (Signed)
Pt trx to US  

## 2019-07-17 NOTE — ED Notes (Signed)
Echo at bedside at this time

## 2019-07-17 NOTE — ED Notes (Signed)
Pt's son at bedside at this time.

## 2019-07-17 NOTE — ED Provider Notes (Signed)
[facility] Emergency Department Provider Note   ____________________________________________   First MD Initiated Contact with Patient 07/17/19 [id number]     (approximate)  I have reviewed the triage vital signs and the nursing notes.   HISTORY  Chief Complaint Emesis    HPI Rebekah Bridges is a 86 y.o. female with past medical history of hypertension, hyperlipidemia, and peripheral arterial disease who presents to the ED complaining of vomiting and back pain.  Patient reports that she had a small amount of wine last night after dinner, overnight developed nausea with multiple episodes of vomiting.  She estimates about 10 episodes of nonbilious and nonbloody emesis and developed diffuse pain across her abdomen and lower back following onset of vomiting.  Pain primarily affects her upper abdomen on both sides and the middle of her lower back.  She states she has previously had fractures in her back and current symptoms feel similar.  She denies any numbness or weakness in her lower extremities and has not had any difficulty urinating or with bowel movements.  She was noted to be in A. fib with EMS, denies any history of this.        Past Medical History:  Diagnosis Date  . Osteoporosis    s/p vertebroplasty for fracutres x 3    Patient Active Problem List   Diagnosis Date Noted  . Essential hypertension 02/06/2014  . Back pain 01/30/2014  . Neuropathy 01/30/2014  . PAD (peripheral artery disease) ([place]) 08/28/2012  . History of headache 08/28/2012  . HYPERLIPIDEMIA 01/24/2010  . OSTEOARTHRITIS 01/24/2010  . BACK PAIN 01/24/2010  . OSTEOPOROSIS 01/24/2010    Past Surgical History:  Procedure Laterality Date  . BLADDER AUGMENTATION  1964  . [place]  2010  . [place]  2011  . SPINE SURGERY  2012    Prior to Admission medications   Medication Sig Start Date End Date Taking? Authorizing Provider  aspirin EC 81 MG tablet Take 81 mg by  mouth daily.    [provider]  gabapentin (NEURONTIN) 100 MG capsule Take 1 capsule (100 mg total) by mouth 3 (three) times daily. 01/28/14   Crecencio Mc, MD  HYDROcodone-acetaminophen (NORCO/VICODIN) 5-325 MG per tablet Take 1 tablet by mouth every 6 (six) hours as needed for moderate pain. 01/29/14   Crecencio Mc, MD  meloxicam (MOBIC) 15 MG tablet Take 1 tablet (15 mg total) by mouth daily. 01/28/14   Crecencio Mc, MD  OVER THE COUNTER MEDICATION Take 1 tablet by mouth every 6 (six) hours as needed. Antacid for stomach distress     [provider]    Allergies Penicillins, Sulfa antibiotics, and Tramadol hcl  Family History  Problem Relation Age of Onset  . Cancer Father   . Learning disabilities Paternal Grandmother     Social History Social History   Tobacco Use  . Smoking status: Never Smoker  . Smokeless tobacco: Never Used  Substance Use Topics  . Alcohol use: Yes  . Drug use: No    Review of Systems  Constitutional: No fever/chills Eyes: No visual changes. ENT: No sore throat. Cardiovascular: Denies chest pain. Respiratory: Denies shortness of breath. Gastrointestinal: Positive for abdominal pain.  Positive for nausea and vomiting.  No diarrhea.  No constipation. Genitourinary: Negative for dysuria. Musculoskeletal: Positive for back pain. Skin: Negative for rash. Neurological: Negative for headaches, focal weakness or numbness.  ____________________________________________   PHYSICAL EXAM:  VITAL SIGNS: ED Triage Vitals [07/17/19  0723]  Enc Vitals Group     BP (!) 157/91     Pulse Rate (!) 127     Resp 19     Temp 98 F (36.7 C)     Temp Source Oral     SpO2 92 %     Weight      Height      Head Circumference      Peak Flow      Pain Score      Pain Loc      Pain Edu?      Excl. in [place]?     Constitutional: Alert and oriented. Eyes: Conjunctivae are normal. Head: Atraumatic. Nose: No congestion/rhinnorhea.  Mouth/Throat: Mucous membranes are moist. Neck: Normal ROM Cardiovascular: Tachycardic, irregularly irregular rhythm. Grossly normal heart sounds. Respiratory: Normal respiratory effort.  No retractions. Lungs CTAB. Gastrointestinal: Soft and tender to palpation in the epigastrium and right upper quadrant. No distention.  No CVA tenderness bilaterally. Genitourinary: deferred Musculoskeletal: No lower extremity tenderness nor edema.  Midline lumbar spinal tenderness noted. Neurologic:  Normal speech and language. No gross focal neurologic deficits are appreciated. Skin:  Skin is warm, dry and intact. No rash noted. Psychiatric: Mood and affect are normal. Speech and behavior are normal.  ____________________________________________   LABS (all labs ordered are listed, but only abnormal results are displayed)  Labs Reviewed  CBC WITH DIFFERENTIAL/PLATELET - Abnormal; Notable for the following components:      Result Value   Platelets 117 (*)    Lymphs Abs 0.3 (*)    All other components within normal limits  MAGNESIUM - Abnormal; Notable for the following components:   Magnesium 1.6 (*)    All other components within normal limits  COMPREHENSIVE METABOLIC PANEL - Abnormal; Notable for the following components:   Glucose, Bld 140 (*)    BUN 24 (*)    Calcium 8.8 (*)    AST 253 (*)    ALT 81 (*)    All other components within normal limits  LIPASE, BLOOD - Abnormal; Notable for the following components:   Lipase 2,035 (*)    All other components within normal limits  URINALYSIS, COMPLETE (UACMP) WITH MICROSCOPIC - Abnormal; Notable for the following components:   Color, Urine YELLOW (*)    APPearance HAZY (*)    Protein, ur 30 (*)    All other components within normal limits  TROPONIN I (HIGH SENSITIVITY) - Abnormal; Notable for the following components:   Troponin I (High Sensitivity) 30 (*)    All other components within normal limits  TROPONIN I (HIGH SENSITIVITY) -  Abnormal; Notable for the following components:   Troponin I (High Sensitivity) 36 (*)    All other components within normal limits  SARS CORONAVIRUS 2 (TAT 6-24 HRS)   ____________________________________________  EKG  ED ECG REPORT I, Blake Divine, the attending physician, personally viewed and interpreted this ECG.   Date: 07/17/2019  EKG Time: 7:20  Rate: 141  Rhythm: atrial fibrillation, rate 141  Axis: Normal  Intervals:none  ST&T Change: None  ED ECG REPORT I, Blake Divine, the attending physician, personally viewed and interpreted this ECG.   Date: 07/17/2019  EKG Time: 8:04  Rate: 97  Rhythm: normal sinus rhythm  Axis: LAD  Intervals:none  ST&T Change: None    PROCEDURES  Procedure(s) performed (including Critical Care):  .Critical Care Performed by: Blake Divine, MD Authorized by: Blake Divine, MD   Critical care provider statement:    Critical  care time (minutes):  45   Critical care time was exclusive of:  Separately billable procedures and treating other patients and teaching time   Critical care was necessary to treat or prevent imminent or life-threatening deterioration of the following conditions:  Cardiac failure   Critical care was time spent personally by me on the following activities:  Discussions with consultants, evaluation of patient's response to treatment, examination of patient, ordering and performing treatments and interventions, ordering and review of laboratory studies, ordering and review of radiographic studies, pulse oximetry, re-evaluation of patient's condition, obtaining history from patient or surrogate and review of old charts   I assumed direction of critical care for this patient from another provider in my specialty: no   .1-3 Lead EKG Interpretation Performed by: Blake Divine, MD Authorized by: Blake Divine, MD     Interpretation: abnormal     ECG rate:  134   ECG rate assessment: tachycardic     Rhythm:  atrial fibrillation     Ectopy: none     Conduction: normal       ____________________________________________   INITIAL IMPRESSION / ASSESSMENT AND PLAN / ED COURSE       86 year old female with possible history of hypertension, hyperlipidemia, and peripheral arterial disease presents to the ED complaining of nausea and vomiting developing overnight now associated with upper abdominal pain as well as midline lumbar spinal pain.  She is also noted to be in atrial fibrillation with RVR upon arrival, blood pressure stable.  She has no history of this and we will start her on Cardizem for rate control.  Plan to further assess her vomiting and abdominal pain with CT scan and labs, would also be concerned for lumbar spinal injury given her midline tenderness to palpation.  She has a history of osteoporosis with prior compression fractures.  Her nausea is improved following dose of Zofran with EMS, we will treat pain with IV morphine.  Anticipate admission for further management of A. fib and vomiting.  ----------------------------------------- 8:10 AM on 07/17/2019 -----------------------------------------  Patient noted to have spontaneously converted to normal sinus rhythm prior to initiation of Cardizem drip.  We will hold Cardizem for now and continue to monitor, repeat EKG shows no acute ischemic changes.  Labs significant for lipase of greater than 2000, CT scan also showing peripancreatic inflammation.  Bilirubin and alk phos are within normal limits and no CBD dilation noted to raise suspicion of gallstone pancreatitis.  Case was discussed with hospitalist for admission, will add on right upper quadrant ultrasound.      ____________________________________________   FINAL CLINICAL IMPRESSION(S) / ED DIAGNOSES  Final diagnoses:  RUQ pain  Acute pancreatitis without infection or necrosis, unspecified pancreatitis type     ED Discharge Orders    None       Note:  This  document was prepared using Dragon voice recognition software and may include unintentional dictation errors.   Blake Divine, MD 07/17/19 1029

## 2019-07-18 ENCOUNTER — Encounter: Payer: Self-pay | Admitting: Internal Medicine

## 2019-07-18 DIAGNOSIS — K859 Acute pancreatitis without necrosis or infection, unspecified: Secondary | ICD-10-CM

## 2019-07-18 LAB — BASIC METABOLIC PANEL
Anion gap: 7 (ref 5–15)
BUN: 23 mg/dL (ref 8–23)
CO2: 25 mmol/L (ref 22–32)
Calcium: 7.7 mg/dL — ABNORMAL LOW (ref 8.9–10.3)
Chloride: 99 mmol/L (ref 98–111)
Creatinine, Ser: 0.5 mg/dL (ref 0.44–1.00)
GFR calc Af Amer: >60 mL/min (ref >60)
GFR calc non Af Amer: >60 mL/min (ref >60)
Glucose, Bld: 89 mg/dL (ref 70–99)
Potassium: 4.5 mmol/L (ref 3.5–5.1)
Sodium: 131 mmol/L — ABNORMAL LOW (ref 135–145)

## 2019-07-18 LAB — LIPID PANEL
Cholesterol: 161 mg/dL (ref 0–200)
HDL: 48 mg/dL (ref >40)
LDL Cholesterol: 100 mg/dL — ABNORMAL HIGH (ref 0–99)
Total CHOL/HDL Ratio: 3.4 RATIO
Triglycerides: 67 mg/dL (ref <150)
VLDL: 13 mg/dL (ref 0–40)

## 2019-07-18 LAB — CBC
HCT: 37.8 % (ref 36.0–46.0)
Hemoglobin: 12.7 g/dL (ref 12.0–15.0)
MCH: 32.2 pg (ref 26.0–34.0)
MCHC: 33.6 g/dL (ref 30.0–36.0)
MCV: 95.7 fL (ref 80.0–100.0)
Platelets: 107 10*3/uL — ABNORMAL LOW (ref 150–400)
RBC: 3.95 MIL/uL (ref 3.87–5.11)
RDW: 13.6 % (ref 11.5–15.5)
WBC: 7.4 10*3/uL (ref 4.0–10.5)
nRBC: 0 % (ref 0.0–0.2)

## 2019-07-18 LAB — LIPASE, BLOOD: Lipase: 116 U/L — ABNORMAL HIGH (ref 11–51)

## 2019-07-18 LAB — MRSA PCR SCREENING: MRSA by PCR: NEGATIVE

## 2019-07-18 MED ORDER — ENOXAPARIN SODIUM 30 MG/0.3ML ~~LOC~~ SOLN
30.0000 mg | Freq: Every day | SUBCUTANEOUS | Status: DC
  Administered 2019-07-18: 30 mg via SUBCUTANEOUS
  Filled 2019-07-18 (×3): qty 0.3

## 2019-07-18 MED ORDER — SODIUM CHLORIDE 0.9 % IV BOLUS
1000.0000 mL | Freq: Once | INTRAVENOUS | Status: AC
  Administered 2019-07-18: 1000 mL via INTRAVENOUS

## 2019-07-18 NOTE — Progress Notes (Signed)
PROGRESS NOTE    Rebekah Bridges  OJJ:[id number] DOB: 1915/09/09 DOA: 07/17/2019 PCP: Sherlene Shams, MD    Assessment & Plan:   Principal Problem:   Acute pancreatitis Active Problems:   PAD (peripheral artery disease) (HCC)   Essential hypertension   Unspecified atrial fibrillation (HCC)    Rebekah Bridges is a 86 y.o. Caucasian female with medical history significant for hypertension, peripheral arterial disease and dyslipidemia who presents to the emergency room for evaluation of vomiting and back pain.   Acute pancreatitis  --Lipase 2000.   --No heavy alcohol, triglyceride wnl --US abdomen showed sludge in gallbladder, and normal caliber common bile duct. PLAN: --recheck lipase (trended down to 116 already) --NS 1L --continue MIVF@75  --clear liquid diet, advance to Full liquid tonight if tolerated  Hypertension --IV hydralazine PRN  Atrial fibrillation w RVR, POA, resolved Patient was noted to be in rapid atrial fibrillation in the field but converted to sinus rhythm in the emergency room without any intervention.  No prior hx.   --2D echocardiogram showed normal LVEF and grade I diastolic dysfunction.   DVT prophylaxis: Lovenox SQ Code Status: Full code  Family Communication: son updated at bedside today Status is: inpatient Dispo:   The patient is from: home Anticipated d/c is to: home Anticipated d/c date is: tomorrow Patient currently is not medically stable to d/c due to: still on IVF for acute pancreatitis   Subjective and Interval History:  Pt reported much improved abdominal pain.  Tolerating clear liquid diet.  No fever, dyspnea, chest pain, N/V/D, dysuria.    Objective: Vitals:   07/18/19 0600 07/18/19 0700 07/18/19 1330 07/18/19 1549  BP: 140/76 (!) 154/92 126/64 (!) 173/85  Pulse: (!) 110 99 74 80  Resp: (!) 23 (!) 23 19 16   Temp:    98.8 F (37.1 C)  TempSrc:    Oral  SpO2: 99% 99% 97% 100%  Weight:      Height:         Intake/Output Summary (Last 24 hours) at 07/18/2019 1739 Last data filed at 07/18/2019 1604 Gross per 24 hour  Intake 2339.19 ml  Output 200 ml  Net 2139.19 ml   Filed Weights   07/17/19 0726  Weight: 59 kg    Examination:   Constitutional: NAD, AAOx3, much younger than stated age HEENT: conjunctivae and lids normal, EOMI CV: RRR no M,R,G. Distal pulses +2.  No cyanosis.   RESP: CTA B/L, normal respiratory effort  GI: +BS, NTND Extremities: No effusions, edema, or tenderness in BLE SKIN: warm, dry and intact Neuro: II - XII grossly intact.  Sensation intact Psych: Normal mood and affect.  Appropriate judgement and reason   Data Reviewed: I have personally reviewed following labs and imaging studies  CBC: Recent Labs  Lab 07/17/19 0732 07/18/19 0653  WBC 8.0 7.4  NEUTROABS 7.3  --   HGB 13.9 12.7  HCT 41.4 37.8  MCV 94.7 95.7  PLT 117* 107*   Basic Metabolic Panel: Recent Labs  Lab 07/17/19 0732 07/18/19 0653  NA 139 131*  K 4.0 4.5  CL 100 99  CO2 29 25  GLUCOSE 140* 89  BUN 24* 23  CREATININE 0.62 0.50  CALCIUM 8.8* 7.7*  MG 1.6*  --    GFR: Estimated Creatinine Clearance: 27.8 mL/min (by C-G formula based on SCr of 0.5 mg/dL). Liver Function Tests: Recent Labs  Lab 07/17/19 0732  AST 253*  ALT 81*  ALKPHOS 105  BILITOT 1.1  PROT  7.2  ALBUMIN 3.9   Recent Labs  Lab 07/17/19 0732 07/18/19 0653  LIPASE 2,035* 116*   No results for input(s): AMMONIA in the last 168 hours. Coagulation Profile: No results for input(s): INR, PROTIME in the last 168 hours. Cardiac Enzymes: No results for input(s): CKTOTAL, CKMB, CKMBINDEX, TROPONINI in the last 168 hours. BNP (last 3 results) No results for input(s): PROBNP in the last 8760 hours. HbA1C: No results for input(s): HGBA1C in the last 72 hours. CBG: No results for input(s): GLUCAP in the last 168 hours. Lipid Profile: Recent Labs    07/18/19 0653  CHOL 161  HDL 48  LDLCALC 100*   TRIG 67  CHOLHDL 3.4   Thyroid Function Tests: No results for input(s): TSH, T4TOTAL, FREET4, T3FREE, THYROIDAB in the last 72 hours. Anemia Panel: No results for input(s): VITAMINB12, FOLATE, FERRITIN, TIBC, IRON, RETICCTPCT in the last 72 hours. Sepsis Labs: No results for input(s): PROCALCITON, LATICACIDVEN in the last 168 hours.  Recent Results (from the past 240 hour(s))  SARS CORONAVIRUS 2 (TAT 6-24 HRS) Nasopharyngeal Nasopharyngeal Swab     Status: None   Collection Time: 07/17/19  7:32 AM   Specimen: Nasopharyngeal Swab  Result Value Ref Range Status   SARS Coronavirus 2 NEGATIVE NEGATIVE Final    Comment: (NOTE) SARS-CoV-2 target nucleic acids are NOT DETECTED. The SARS-CoV-2 RNA is generally detectable in upper and lower respiratory specimens during the acute phase of infection. Negative results do not preclude SARS-CoV-2 infection, do not rule out co-infections with other pathogens, and should not be used as the sole basis for treatment or other patient management decisions. Negative results must be combined with clinical observations, patient history, and epidemiological information. The expected result is Negative. Fact Sheet for Patients: HairSlick.no Fact Sheet for Healthcare Providers: quierodirigir.com This test is not yet approved or cleared by the [place] FDA and  has been authorized for detection and/or diagnosis of SARS-CoV-2 by FDA under an Emergency Use Authorization (EUA). This EUA will remain  in effect (meaning this test can be used) for the duration of the COVID-19 declaration under Section 56 4(b)(1) of the Act, 21 U.S.C. section 360bbb-3(b)(1), unless the authorization is terminated or revoked sooner. Performed at [facility], [address], [place], [place] [address]   MRSA PCR Screening     Status: None   Collection Time: 07/18/19  4:17 PM   Specimen: Nasopharyngeal  Result  Value Ref Range Status   MRSA by PCR NEGATIVE NEGATIVE Final    Comment:        The GeneXpert MRSA Assay (FDA approved for NASAL specimens only), is one component of a comprehensive MRSA colonization surveillance program. It is not intended to diagnose MRSA infection nor to guide or monitor treatment for MRSA infections. Performed at [facility], [address] [place], [place] [address]       Radiology Studies: CT Abdomen Pelvis W Contrast  Result Date: 07/17/2019 CLINICAL DATA:  Abdominal pain and vomiting. EXAM: CT ABDOMEN AND PELVIS WITH CONTRAST TECHNIQUE: Multidetector CT imaging of the abdomen and pelvis was performed using the standard protocol following bolus administration of intravenous contrast. CONTRAST:  41mL OMNIPAQUE IOHEXOL 300 MG/ML  SOLN COMPARISON:  None. FINDINGS: Lower chest: Platelike scarring versus atelectasis at the dependent lung bases bilaterally. Coronary atherosclerosis. Hepatobiliary: Normal liver size. Scattered granulomatous small liver calcifications. No liver masses. Tiny layering gallstones in gallbladder. No definite gallbladder wall thickening. No pericholecystic fluid. No biliary ductal dilatation. Pancreas:  Mild haziness of the peripancreatic fat in the pancreatic head and body. No pancreatic mass or duct dilation. Spleen: Normal size spleen. Granulomatous splenic calcifications. No splenic masses. Adrenals/Urinary Tract: Normal adrenals. No hydronephrosis. Simple 1.2 cm renal cortical cyst in the medial lower left kidney. Small parapelvic simple right renal cysts. Normal bladder. Stomach/Bowel: Small hiatal hernia. Otherwise normal nondistended stomach. Normal caliber small bowel with no small bowel wall thickening. Normal appendix. Mild cecal diverticulosis. No large bowel wall thickening or significant pericolonic fat stranding. Vascular/Lymphatic: Atherosclerotic nonaneurysmal abdominal aorta. Patent portal, splenic, hepatic and renal veins.  No pathologically enlarged lymph nodes in the abdomen or pelvis. Reproductive: Coarsely calcified small posterior uterine fibroid. Atrophic uterus. Endometrium indistinct and not accurately evaluated on this CT study. No adnexal masses. Other: No pneumoperitoneum, ascites or focal fluid collection. Musculoskeletal: No aggressive appearing focal osseous lesions. Diffuse osteopenia. Mild to moderate T11, T12, L2 and L3 vertebral compression fracture status post vertebroplasty. Marked lumbar spondylosis. IMPRESSION: 1. Mild haziness of the peripancreatic fat in the pancreatic head and body, suggesting acute non-necrotizing pancreatitis. No pancreatic mass or duct dilation. No biliary ductal dilatation. Suggest correlation with serum lipase. 2. Cholelithiasis. 3. Small hiatal hernia. 4. Mild cecal diverticulosis. 5. Diffuse osteopenia. 6. Aortic Atherosclerosis (ICD10-I70.0). Electronically Signed   By: Delbert PhenixJason A Poff M.D.   On: 07/17/2019 09:43   ECHOCARDIOGRAM COMPLETE  Result Date: 07/17/2019    ECHOCARDIOGRAM REPORT   Patient Name:   Manuella POGATS Laforge Date of Exam: 07/17/2019 Medical Rec #:  [id number]       Height:       59.0 in Accession #:    [id number]      Weight:       130.0 lb Date of Birth:  Apr 10, 1915       BSA:          1.536 m Patient Age:    89 years       BP:           145/82 mmHg Patient Gender: F               HR:           84 bpm. Exam Location:  [facility] Procedure: 2D Echo, Cardiac Doppler and Color Doppler Indications:     Atrial Fibrillation 427.31  History:         Patient has no prior history of Echocardiogram examinations. No                  cardiac history listed.  Sonographer:     Cristela BlueJerry Hege RDCS (AE) Referring Phys:  [id number] Lucile ShuttersCHUKWU AGBATA Diagnosing Phys: Julien Nordmannimothy Gollan MD IMPRESSIONS  1. Left ventricular ejection fraction, by estimation, is 60 to 65%. The left ventricle has normal function. The left ventricle has no regional wall motion abnormalities. Left ventricular diastolic parameters are  consistent with Grade I diastolic dysfunction (impaired relaxation).  2. Right ventricular systolic function is normal. The right ventricular size is normal. There is normal pulmonary artery systolic pressure.  3. Rhythm is normal sinus. FINDINGS  Left Ventricle: Left ventricular ejection fraction, by estimation, is 60 to 65%. The left ventricle has normal function. The left ventricle has no regional wall motion abnormalities. The left ventricular internal cavity size was normal in size. There is  no left ventricular hypertrophy. Left ventricular diastolic parameters are consistent with Grade I diastolic dysfunction (impaired relaxation). Right Ventricle: The right ventricular size is normal. No increase in right ventricular wall thickness. Right ventricular  systolic function is normal. There is normal pulmonary artery systolic pressure. The tricuspid regurgitant velocity is 2.19 m/s, and  with an assumed right atrial pressure of 10 mmHg, the estimated right ventricular systolic pressure is 65.7 mmHg. Left Atrium: Left atrial size was normal in size. Right Atrium: Right atrial size was normal in size. Pericardium: There is no evidence of pericardial effusion. Mitral Valve: The mitral valve is normal in structure. Normal mobility of the mitral valve leaflets. Mild mitral valve regurgitation. No evidence of mitral valve stenosis. Tricuspid Valve: The tricuspid valve is normal in structure. Tricuspid valve regurgitation is mild . No evidence of tricuspid stenosis. Aortic Valve: The aortic valve is normal in structure. Aortic valve regurgitation is mild. Aortic regurgitation PHT measures 539 msec. No aortic stenosis is present. Aortic valve mean gradient measures 4.0 mmHg. Aortic valve peak gradient measures 8.4 mmHg. Aortic valve area, by VTI measures 2.91 cm. Pulmonic Valve: The pulmonic valve was normal in structure. Pulmonic valve regurgitation is not visualized. No evidence of pulmonic stenosis. Aorta: The aortic  root is normal in size and structure. Venous: The inferior vena cava is normal in size with greater than 50% respiratory variability, suggesting right atrial pressure of 3 mmHg. IAS/Shunts: No atrial level shunt detected by color flow Doppler.  LEFT VENTRICLE PLAX 2D LVIDd:         4.36 cm LVIDs:         2.99 cm LV PW:         1.19 cm LV IVS:        1.66 cm LVOT diam:     2.00 cm LV SV:         70 LV SV Index:   45 LVOT Area:     3.14 cm  RIGHT VENTRICLE RV Basal diam:  1.89 cm RV S prime:     11.90 cm/s TAPSE (M-mode): 2.9 cm LEFT ATRIUM             Index       RIGHT ATRIUM           Index LA diam:        3.20 cm 2.08 cm/m  RA Area:     15.50 cm LA Vol (A2C):   37.7 ml 24.55 ml/m RA Volume:   38.50 ml  25.07 ml/m LA Vol (A4C):   33.2 ml 21.62 ml/m LA Biplane Vol: 37.6 ml 24.48 ml/m  AORTIC VALVE                   PULMONIC VALVE AV Area (Vmax):    2.58 cm    PV Vmax:       0.72 m/s AV Area (Vmean):   2.84 cm    PV Peak grad:  2.1 mmHg AV Area (VTI):     2.91 cm AV Vmax:           145.00 cm/s AV Vmean:          83.450 cm/s AV VTI:            0.240 m AV Peak Grad:      8.4 mmHg AV Mean Grad:      4.0 mmHg LVOT Vmax:         119.00 cm/s LVOT Vmean:        75.500 cm/s LVOT VTI:          0.222 m LVOT/AV VTI ratio: 0.93 AI PHT:            539  msec  AORTA Ao Root diam: 2.75 cm MITRAL VALVE                TRICUSPID VALVE MV Area (PHT): 4.49 cm     TR Peak grad:   19.2 mmHg MV Decel Time: 169 msec     TR Vmax:        219.00 cm/s MV E velocity: 82.90 cm/s MV A velocity: 128.00 cm/s  SHUNTS MV E/A ratio:  0.65         Systemic VTI:  0.22 m                             Systemic Diam: 2.00 cm Julien Nordmann MD Electronically signed by Julien Nordmann MD Signature Date/Time: 07/17/2019/4:10:27 PM    Final    US Abdomen Limited RUQ  Result Date: 07/17/2019 CLINICAL DATA:  Upper abdominal pain.  Nausea. EXAM: ULTRASOUND ABDOMEN LIMITED RIGHT UPPER QUADRANT COMPARISON:  CT abdomen and pelvis Jul 17, 2019 FINDINGS:  Gallbladder: There is sludge in the gallbladder. No calculi are appreciable by ultrasound in the gallbladder. No gallbladder wall thickening or pericholecystic fluid. No sonographic Murphy sign noted by sonographer. Common bile duct: Diameter: 5 mm. No intrahepatic or extrahepatic biliary duct dilatation. Liver: There is an apparent calcified granuloma in the right lobe of the liver, also present on CT. Liver echogenicity overall is somewhat increased. Portal vein is patent on color Doppler imaging with normal direction of blood flow towards the liver. Other: None. IMPRESSION: 1. There is sludge in the gallbladder. No gallstones are evident on this study. Tiny gallstones could be intermingled with sludge. No gallbladder wall thickening or pericholecystic fluid. 2. Small granuloma in the right lobe of the liver. There is a degree of increased liver echogenicity which may represent underlying fatty infiltration in the liver. No focal liver lesions identified beyond the small calcification in the right lobe. Electronically Signed   By: Bretta Bang III M.D.   On: 07/17/2019 11:21     Scheduled Meds: . aspirin EC  81 mg Oral Daily  . diltiazem  10 mg Intravenous Once  . enoxaparin (LOVENOX) injection  30 mg Subcutaneous q1800  . pantoprazole (PROTONIX) IV  40 mg Intravenous Q24H   Continuous Infusions: . sodium chloride 75 mL/hr at 07/18/19 0726  . diltiazem (CARDIZEM) infusion       LOS: 1 day     Darlin Priestly, MD [facility] If 7PM-7AM, please contact night-coverage 07/18/2019, 5:39 PM

## 2019-07-18 NOTE — ED Notes (Signed)
Pharmacy contacted by this RN to send lovenox

## 2019-07-18 NOTE — Plan of Care (Signed)
  Problem: Education: Goal: Knowledge of General Education information will improve Description Including pain rating scale, medication(s)/side effects and non-pharmacologic comfort measures Outcome: Progressing   Problem: Health Behavior/Discharge Planning: Goal: Ability to manage health-related needs will improve Outcome: Progressing   

## 2019-07-19 LAB — CBC
HCT: 34.7 % — ABNORMAL LOW (ref 36.0–46.0)
Hemoglobin: 11 g/dL — ABNORMAL LOW (ref 12.0–15.0)
MCH: 31.3 pg (ref 26.0–34.0)
MCHC: 31.7 g/dL (ref 30.0–36.0)
MCV: 98.9 fL (ref 80.0–100.0)
Platelets: 104 10*3/uL — ABNORMAL LOW (ref 150–400)
RBC: 3.51 MIL/uL — ABNORMAL LOW (ref 3.87–5.11)
RDW: 13.5 % (ref 11.5–15.5)
WBC: 4.5 10*3/uL (ref 4.0–10.5)
nRBC: 0 % (ref 0.0–0.2)

## 2019-07-19 LAB — BASIC METABOLIC PANEL
Anion gap: 7 (ref 5–15)
BUN: 20 mg/dL (ref 8–23)
CO2: 23 mmol/L (ref 22–32)
Calcium: 7.2 mg/dL — ABNORMAL LOW (ref 8.9–10.3)
Chloride: 106 mmol/L (ref 98–111)
Creatinine, Ser: 0.71 mg/dL (ref 0.44–1.00)
GFR calc Af Amer: >60 mL/min (ref >60)
GFR calc non Af Amer: >60 mL/min (ref >60)
Glucose, Bld: 84 mg/dL (ref 70–99)
Potassium: 3.7 mmol/L (ref 3.5–5.1)
Sodium: 136 mmol/L (ref 135–145)

## 2019-07-19 LAB — MAGNESIUM: Magnesium: 1.8 mg/dL (ref 1.7–2.4)

## 2019-07-19 MED ORDER — KETOROLAC TROMETHAMINE 15 MG/ML IJ SOLN
15.0000 mg | Freq: Once | INTRAMUSCULAR | Status: AC
  Administered 2019-07-19: 15 mg via INTRAVENOUS
  Filled 2019-07-19: qty 1

## 2019-07-19 MED ORDER — ACETAMINOPHEN 325 MG PO TABS: 650.0000 mg | ORAL_TABLET | Freq: Once | ORAL | Status: DC

## 2019-07-19 NOTE — Plan of Care (Signed)

## 2019-07-19 NOTE — Progress Notes (Signed)
Pt complaints of a headache. Np Webb Silversmith notified new orders placed.

## 2019-07-19 NOTE — Discharge Summary (Signed)
Physician Discharge Summary   Rebekah Bridges  female DOB: Feb 20, 1915  ZOX:[id number]  PCP: Sherlene Shams, MD  Admit date: 07/17/2019 Discharge date: 07/19/2019  Admitted From: home Disposition:  Home Son updated prior to discharge.  CODE STATUS: Full code  Discharge Instructions    Diet - low sodium heart healthy   Complete by: As directed    Discharge instructions   Complete by: As directed    As we discussed, you had pancreatitis, and the only thing we found was sludge in your gallbladder.  You have recovered well.  If you have pancreatitis again, you will need to consider taking out that gallbladder, but for now, it's better to leave things be given your age and risk of surgery.  Please follow up with your PCP.   Dr. Darlin Priestly - -   Increase activity slowly   Complete by: As directed        Hospital Course:  For full details, please see H&P, progress notes, consult notes and ancillary notes.  Briefly,  Rebekah Bridges a 86 y.o. Caucasianfemalewith medical history significant forhypertension, peripheral arterial disease and dyslipidemia who presented to the emergency room for evaluation of vomiting and back pain.   Acute pancreatitis  Lipase 2000 on presentation.  No heavy alcohol use, triglyceride wnl.  US abdomen showed sludge in gallbladder, and normal caliber common bile duct.  Pt received IVF and put on clear liquid diet.  Lipase trended down to 116 already by next day, and pt's abdominal pain much improved so diet advanced.    It was discussed with the son that the gallbladder sludge was the most likely culprit of pt's pancreatitis, but since it's the first episode and resolved quickly, it would be better to avoid surgery given pt's age and risk factors.  Hypertension Not on home BP meds.    Atrial fibrillation w RVR, POA, resolved Patient was noted to be in rapid atrial fibrillation in the field but converted to sinus rhythm in the emergency room  without any intervention.  No prior hx.  2D echocardiogram showed normal LVEF and grade I diastolic dysfunction.   Discharge Diagnoses:  Principal Problem:   Acute pancreatitis Active Problems:   PAD (peripheral artery disease) (HCC)   Essential hypertension   Unspecified atrial fibrillation [facility])    Discharge Instructions:  Allergies as of 07/19/2019      Reactions   Penicillins Nausea Only   And a rash   Tramadol Hcl Other (See Comments)   REACTION: nausea and sweating   Tramadol Hcl Other (See Comments)   REACTION: nausea and sweating   Sulfa Antibiotics Nausea Only, Hives, Rash   And rash      Medication List    TAKE these medications   calcium carbonate 500 MG chewable tablet Commonly known as: TUMS - dosed in mg elemental calcium Chew 1 tablet by mouth as needed for indigestion or heartburn.   Cholecalciferol 25 MCG (1000 UT) capsule Take by mouth.   gabapentin 100 MG capsule Commonly known as: NEURONTIN Take 1 capsule (100 mg total) by mouth 3 (three) times daily.   ibuprofen 200 MG tablet Commonly known as: ADVIL Take by mouth every 8 (eight) hours as needed.   multivitamin with minerals Tabs tablet Take 1 tablet by mouth daily.   QC Tumeric Complex 500 MG Caps Generic drug: Turmeric Take by mouth.   Red Yeast Rice Extract 600 MG Caps Take by mouth.  Follow-up Information    Sherlene Shams, MD. Schedule an appointment as soon as possible for a visit in 1 week(s).   Specialty: Internal Medicine Contact information: [address] [place] [address] [phone]           Allergies  Allergen Reactions  . Penicillins Nausea Only    And a rash  . Tramadol Hcl Other (See Comments)    REACTION: nausea and sweating  . Tramadol Hcl Other (See Comments)    REACTION: nausea and sweating  . Sulfa Antibiotics Nausea Only, Hives and Rash    And rash     The results of significant diagnostics from this  hospitalization (including imaging, microbiology, ancillary and laboratory) are listed below for reference.   Consultations:   Procedures/Studies: CT Abdomen Pelvis W Contrast  Result Date: 07/17/2019 CLINICAL DATA:  Abdominal pain and vomiting. EXAM: CT ABDOMEN AND PELVIS WITH CONTRAST TECHNIQUE: Multidetector CT imaging of the abdomen and pelvis was performed using the standard protocol following bolus administration of intravenous contrast. CONTRAST:  41mL OMNIPAQUE IOHEXOL 300 MG/ML  SOLN COMPARISON:  None. FINDINGS: Lower chest: Platelike scarring versus atelectasis at the dependent lung bases bilaterally. Coronary atherosclerosis. Hepatobiliary: Normal liver size. Scattered granulomatous small liver calcifications. No liver masses. Tiny layering gallstones in gallbladder. No definite gallbladder wall thickening. No pericholecystic fluid. No biliary ductal dilatation. Pancreas: Mild haziness of the peripancreatic fat in the pancreatic head and body. No pancreatic mass or duct dilation. Spleen: Normal size spleen. Granulomatous splenic calcifications. No splenic masses. Adrenals/Urinary Tract: Normal adrenals. No hydronephrosis. Simple 1.2 cm renal cortical cyst in the medial lower left kidney. Small parapelvic simple right renal cysts. Normal bladder. Stomach/Bowel: Small hiatal hernia. Otherwise normal nondistended stomach. Normal caliber small bowel with no small bowel wall thickening. Normal appendix. Mild cecal diverticulosis. No large bowel wall thickening or significant pericolonic fat stranding. Vascular/Lymphatic: Atherosclerotic nonaneurysmal abdominal aorta. Patent portal, splenic, hepatic and renal veins. No pathologically enlarged lymph nodes in the abdomen or pelvis. Reproductive: Coarsely calcified small posterior uterine fibroid. Atrophic uterus. Endometrium indistinct and not accurately evaluated on this CT study. No adnexal masses. Other: No pneumoperitoneum, ascites or focal fluid  collection. Musculoskeletal: No aggressive appearing focal osseous lesions. Diffuse osteopenia. Mild to moderate T11, T12, L2 and L3 vertebral compression fracture status post vertebroplasty. Marked lumbar spondylosis. IMPRESSION: 1. Mild haziness of the peripancreatic fat in the pancreatic head and body, suggesting acute non-necrotizing pancreatitis. No pancreatic mass or duct dilation. No biliary ductal dilatation. Suggest correlation with serum lipase. 2. Cholelithiasis. 3. Small hiatal hernia. 4. Mild cecal diverticulosis. 5. Diffuse osteopenia. 6. Aortic Atherosclerosis (ICD10-I70.0). Electronically Signed   By: Delbert Phenix M.D.   On: 07/17/2019 09:43   ECHOCARDIOGRAM COMPLETE  Result Date: 07/17/2019    ECHOCARDIOGRAM REPORT   Patient Name:   Valencia POGATS Scheffel Date of Exam: 07/17/2019 Medical Rec #:  [id number]       Height:       59.0 in Accession #:    [id number]      Weight:       130.0 lb Date of Birth:  1916/01/16       BSA:          1.536 m Patient Age:    86 years       BP:           145/82 mmHg Patient Gender: F               HR:  84 bpm. Exam Location:  [facility] Procedure: 2D Echo, Cardiac Doppler and Color Doppler Indications:     Atrial Fibrillation 427.31  History:         Patient has no prior history of Echocardiogram examinations. No                  cardiac history listed.  Sonographer:     Sherrie Sport RDCS (AE) Referring Phys:  [id number] Collier Bullock Diagnosing Phys: Ida Rogue MD IMPRESSIONS  1. Left ventricular ejection fraction, by estimation, is 60 to 65%. The left ventricle has normal function. The left ventricle has no regional wall motion abnormalities. Left ventricular diastolic parameters are consistent with Grade I diastolic dysfunction (impaired relaxation).  2. Right ventricular systolic function is normal. The right ventricular size is normal. There is normal pulmonary artery systolic pressure.  3. Rhythm is normal sinus. FINDINGS  Left Ventricle: Left ventricular  ejection fraction, by estimation, is 60 to 65%. The left ventricle has normal function. The left ventricle has no regional wall motion abnormalities. The left ventricular internal cavity size was normal in size. There is  no left ventricular hypertrophy. Left ventricular diastolic parameters are consistent with Grade I diastolic dysfunction (impaired relaxation). Right Ventricle: The right ventricular size is normal. No increase in right ventricular wall thickness. Right ventricular systolic function is normal. There is normal pulmonary artery systolic pressure. The tricuspid regurgitant velocity is 2.19 m/s, and  with an assumed right atrial pressure of 10 mmHg, the estimated right ventricular systolic pressure is 44.3 mmHg. Left Atrium: Left atrial size was normal in size. Right Atrium: Right atrial size was normal in size. Pericardium: There is no evidence of pericardial effusion. Mitral Valve: The mitral valve is normal in structure. Normal mobility of the mitral valve leaflets. Mild mitral valve regurgitation. No evidence of mitral valve stenosis. Tricuspid Valve: The tricuspid valve is normal in structure. Tricuspid valve regurgitation is mild . No evidence of tricuspid stenosis. Aortic Valve: The aortic valve is normal in structure. Aortic valve regurgitation is mild. Aortic regurgitation PHT measures 539 msec. No aortic stenosis is present. Aortic valve mean gradient measures 4.0 mmHg. Aortic valve peak gradient measures 8.4 mmHg. Aortic valve area, by VTI measures 2.91 cm. Pulmonic Valve: The pulmonic valve was normal in structure. Pulmonic valve regurgitation is not visualized. No evidence of pulmonic stenosis. Aorta: The aortic root is normal in size and structure. Venous: The inferior vena cava is normal in size with greater than 50% respiratory variability, suggesting right atrial pressure of 3 mmHg. IAS/Shunts: No atrial level shunt detected by color flow Doppler.  LEFT VENTRICLE PLAX 2D LVIDd:          4.36 cm LVIDs:         2.99 cm LV PW:         1.19 cm LV IVS:        1.66 cm LVOT diam:     2.00 cm LV SV:         70 LV SV Index:   45 LVOT Area:     3.14 cm  RIGHT VENTRICLE RV Basal diam:  1.89 cm RV S prime:     11.90 cm/s TAPSE (M-mode): 2.9 cm LEFT ATRIUM             Index       RIGHT ATRIUM           Index LA diam:        3.20 cm 2.08 cm/m  RA Area:     15.50 cm LA Vol (A2C):   37.7 ml 24.55 ml/m RA Volume:   38.50 ml  25.07 ml/m LA Vol (A4C):   33.2 ml 21.62 ml/m LA Biplane Vol: 37.6 ml 24.48 ml/m  AORTIC VALVE                   PULMONIC VALVE AV Area (Vmax):    2.58 cm    PV Vmax:       0.72 m/s AV Area (Vmean):   2.84 cm    PV Peak grad:  2.1 mmHg AV Area (VTI):     2.91 cm AV Vmax:           145.00 cm/s AV Vmean:          83.450 cm/s AV VTI:            0.240 m AV Peak Grad:      8.4 mmHg AV Mean Grad:      4.0 mmHg LVOT Vmax:         119.00 cm/s LVOT Vmean:        75.500 cm/s LVOT VTI:          0.222 m LVOT/AV VTI ratio: 0.93 AI PHT:            539 msec  AORTA Ao Root diam: 2.75 cm MITRAL VALVE                TRICUSPID VALVE MV Area (PHT): 4.49 cm     TR Peak grad:   19.2 mmHg MV Decel Time: 169 msec     TR Vmax:        219.00 cm/s MV E velocity: 82.90 cm/s MV A velocity: 128.00 cm/s  SHUNTS MV E/A ratio:  0.65         Systemic VTI:  0.22 m                             Systemic Diam: 2.00 cm Julien Nordmann MD Electronically signed by Julien Nordmann MD Signature Date/Time: 07/17/2019/4:10:27 PM    Final    US Abdomen Limited RUQ  Result Date: 07/17/2019 CLINICAL DATA:  Upper abdominal pain.  Nausea. EXAM: ULTRASOUND ABDOMEN LIMITED RIGHT UPPER QUADRANT COMPARISON:  CT abdomen and pelvis Jul 17, 2019 FINDINGS: Gallbladder: There is sludge in the gallbladder. No calculi are appreciable by ultrasound in the gallbladder. No gallbladder wall thickening or pericholecystic fluid. No sonographic Murphy sign noted by sonographer. Common bile duct: Diameter: 5 mm. No intrahepatic or extrahepatic  biliary duct dilatation. Liver: There is an apparent calcified granuloma in the right lobe of the liver, also present on CT. Liver echogenicity overall is somewhat increased. Portal vein is patent on color Doppler imaging with normal direction of blood flow towards the liver. Other: None. IMPRESSION: 1. There is sludge in the gallbladder. No gallstones are evident on this study. Tiny gallstones could be intermingled with sludge. No gallbladder wall thickening or pericholecystic fluid. 2. Small granuloma in the right lobe of the liver. There is a degree of increased liver echogenicity which may represent underlying fatty infiltration in the liver. No focal liver lesions identified beyond the small calcification in the right lobe. Electronically Signed   By: Bretta Bang III M.D.   On: 07/17/2019 11:21      Labs: BNP (last 3 results) No results for input(s): BNP in the last 8760 hours. Basic Metabolic Panel: Recent  Labs  Lab 07/17/19 0732 07/18/19 0653 07/19/19 0443  NA 139 131* 136  K 4.0 4.5 3.7  CL 100 99 106  CO2 29 25 23   GLUCOSE 140* 89 84  BUN 24* 23 20  CREATININE 0.62 0.50 0.71  CALCIUM 8.8* 7.7* 7.2*  MG 1.6*  --  1.8   Liver Function Tests: Recent Labs  Lab 07/17/19 0732  AST 253*  ALT 81*  ALKPHOS 105  BILITOT 1.1  PROT 7.2  ALBUMIN 3.9   Recent Labs  Lab 07/17/19 0732 07/18/19 0653  LIPASE 2,035* 116*   No results for input(s): AMMONIA in the last 168 hours. CBC: Recent Labs  Lab 07/17/19 0732 07/18/19 0653 07/19/19 0443  WBC 8.0 7.4 4.5  NEUTROABS 7.3  --   --   HGB 13.9 12.7 11.0*  HCT 41.4 37.8 34.7*  MCV 94.7 95.7 98.9  PLT 117* 107* 104*   Cardiac Enzymes: No results for input(s): CKTOTAL, CKMB, CKMBINDEX, TROPONINI in the last 168 hours. BNP: Invalid input(s): POCBNP CBG: No results for input(s): GLUCAP in the last 168 hours. D-Dimer No results for input(s): DDIMER in the last 72 hours. Hgb A1c No results for input(s): HGBA1C in  the last 72 hours. Lipid Profile Recent Labs    07/18/19 0653  CHOL 161  HDL 48  LDLCALC 100*  TRIG 67  CHOLHDL 3.4   Thyroid function studies No results for input(s): TSH, T4TOTAL, T3FREE, THYROIDAB in the last 72 hours.  Invalid input(s): FREET3 Anemia work up No results for input(s): VITAMINB12, FOLATE, FERRITIN, TIBC, IRON, RETICCTPCT in the last 72 hours. Urinalysis    Component Value Date/Time   COLORURINE YELLOW (A) 07/17/2019 0732   APPEARANCEUR HAZY (A) 07/17/2019 0732   LABSPEC 1.013 07/17/2019 0732   PHURINE 7.0 07/17/2019 0732   GLUCOSEU NEGATIVE 07/17/2019 0732   HGBUR NEGATIVE 07/17/2019 0732   BILIRUBINUR NEGATIVE 07/17/2019 0732   KETONESUR NEGATIVE 07/17/2019 0732   PROTEINUR 30 (A) 07/17/2019 0732   NITRITE NEGATIVE 07/17/2019 0732   LEUKOCYTESUR NEGATIVE 07/17/2019 0732   Sepsis Labs Invalid input(s): PROCALCITONIN,  WBC,  LACTICIDVEN Microbiology Recent Results (from the past 240 hour(s))  SARS CORONAVIRUS 2 (TAT 6-24 HRS) Nasopharyngeal Nasopharyngeal Swab     Status: None   Collection Time: 07/17/19  7:32 AM   Specimen: Nasopharyngeal Swab  Result Value Ref Range Status   SARS Coronavirus 2 NEGATIVE NEGATIVE Final    Comment: (NOTE) SARS-CoV-2 target nucleic acids are NOT DETECTED. The SARS-CoV-2 RNA is generally detectable in upper and lower respiratory specimens during the acute phase of infection. Negative results do not preclude SARS-CoV-2 infection, do not rule out co-infections with other pathogens, and should not be used as the sole basis for treatment or other patient management decisions. Negative results must be combined with clinical observations, patient history, and epidemiological information. The expected result is Negative. Fact Sheet for Patients: 07/19/19 Fact Sheet for Healthcare Providers: HairSlick.no This test is not yet approved or cleared by the quierodirigir.com FDA and  has been authorized for detection and/or diagnosis of SARS-CoV-2 by FDA under an Emergency Use Authorization (EUA). This EUA will remain  in effect (meaning this test can be used) for the duration of the COVID-19 declaration under Section 56 4(b)(1) of the Act, 21 U.S.C. section 360bbb-3(b)(1), unless the authorization is terminated or revoked sooner. Performed at [facility], [address], [place], [place]   MRSA PCR Screening     Status: None  Collection Time: 07/18/19  4:17 PM   Specimen: Nasopharyngeal  Result Value Ref Range Status   MRSA by PCR NEGATIVE NEGATIVE Final    Comment:        The GeneXpert MRSA Assay (FDA approved for NASAL specimens only), is one component of a comprehensive MRSA colonization surveillance program. It is not intended to diagnose MRSA infection nor to guide or monitor treatment for MRSA infections. Performed at [facility], [address] [place], [place] [id number]      Total time spend on discharging this patient, including the last patient exam, discussing the hospital stay, instructions for ongoing care as it relates to all pertinent caregivers, as well as preparing the medical discharge records, prescriptions, and/or referrals as applicable, is 35 minutes.    Darlin Priestlyina Dalesha Stanback, MD  [facility] 07/19/2019, 8:58 AM  If 7PM-7AM, please contact night-coverage

## 2019-07-19 NOTE — Discharge Instructions (Signed)
Acute Pancreatitis    Acute pancreatitis happens when the pancreas gets swollen. The pancreas is a large gland in the body that helps to control blood sugar. It also makes enzymes that help to digest food.  This condition can last a few days and cause serious problems. The lungs, heart, and kidneys may stop working.  What are the causes?  Causes include:  · Alcohol abuse.  · Drug abuse.  · Gallstones.  · A tumor in the pancreas.  Other causes include:  · Some medicines.  · Some chemicals.  · Diabetes.  · An infection.  · Damage caused by an accident.  · The poison (venom) from a scorpion bite.  · Belly (abdominal) surgery.  · The body's defense system (immune system) attacking the pancreas (autoimmune pancreatitis).  · Genes that are passed from parent to child (inherited).  In some cases, the cause is not known.  What are the signs or symptoms?  · Pain in the upper belly that may be felt in the back. The pain may be very bad.  · Swelling of the belly.  · Feeling sick to your stomach (nauseous) and throwing up (vomiting).  · Fever.  How is this treated?  You will likely have to stay in the hospital. Treatment may include:  · Pain medicine.  · Fluid through an IV tube.  · Placing a tube in the stomach to take out the stomach contents. This may help you stop throwing up.  · Not eating for 3-4 days.  · Antibiotic medicines, if you have an infection.  · Treating any other problems that may be the cause.  · Steroid medicines, if your problem is caused by your defense system attacking your body's own tissues.  · Surgery.  Follow these instructions at home:  Eating and drinking    · Follow instructions from your doctor about what to eat and drink.  · Eat foods that do not have a lot of fat in them.  · Eat small meals often. Do not eat big meals.  · Drink enough fluid to keep your pee (urine) pale yellow.  · Do not drink alcohol if it caused your condition.  Medicines  · Take over-the-counter and prescription medicines only  as told by your doctor.  · Ask your doctor if the medicine prescribed to you:  ? Requires you to avoid driving or using heavy machinery.  ? Can cause trouble pooping (constipation). You may need to take steps to prevent or treat trouble pooping:  § Take over-the-counter or prescription medicines.  § Eat foods that are high in fiber. These include beans, whole grains, and fresh fruits and vegetables.  § Limit foods that are high in fat and sugar. These include fried or sweet foods.  General instructions  · Do not use any products that contain nicotine or tobacco, such as cigarettes, e-cigarettes, and chewing tobacco. If you need help quitting, ask your doctor.  · Get plenty of rest.  · Check your blood sugar at home as told by your doctor.  · Keep all follow-up visits as told by your doctor. This is important.  Contact a doctor if:  · You do not get better as quickly as expected.  · You have new symptoms.  · Your symptoms get worse.  · You have pain or weakness that lasts a long time.  · You keep feeling sick to your stomach.  · You get better and then you have pain again.  ·   You have a fever.  Get help right away if:  · You cannot eat or keep fluids down.  · Your pain gets very bad.  · Your skin or the white part of your eyes turns yellow.  · You have sudden swelling in your belly.  · You throw up.  · You feel dizzy or you pass out (faint).  · Your blood sugar is high (over 300 mg/dL).  Summary  · Acute pancreatitis happens when the pancreas gets swollen.  · This condition is often caused by alcohol abuse, drug abuse, or gallstones.  · You will likely have to stay in the hospital for treatment.  This information is not intended to replace advice given to you by your health care provider. Make sure you discuss any questions you have with your health care provider.  Document Revised: 11/25/2017 Document Reviewed: 11/25/2017  Elsevier Patient Education © 2020 [employer]vier Inc.

## 2019-07-19 NOTE — Progress Notes (Signed)
PATIENT DISCHARGED HOME. INSTRUCTIONS GIVEN TO PATIENT AND SON, VERBALIZED UNDERSTANDING. IV REMOVED. SON TO TRANSPORT PATIENT HOME.

## 2019-07-19 NOTE — TOC Transition Note (Signed)
Transition of Care [facility]) - CM/SW Discharge Note   Patient Details  Name: Rebekah Bridges MRN: [id number] Date of Birth: 1915-10-09  [facility] CM/SW Contact:  Maud Deed, LCSW Phone Number: 07/19/2019, 10:35 AM   Clinical Narrative:    Pt medically stable for discharge per MD. Pt will be transported back to her residence [name] by her son Molly Maduro.    Final next level of care: Home/Self Care Barriers to Discharge: No Barriers Identified   Patient Goals and CMS Choice        Discharge Placement                Patient to be transferred to facility by: Son Name of family member notified: [name] Patient and family notified of of transfer: 07/19/19  Discharge Plan and Services                                     Social Determinants of Health (SDOH) Interventions     Readmission Risk Interventions No flowsheet data found.

## 2019-07-21 ENCOUNTER — Telehealth: Payer: Self-pay

## 2019-07-21 NOTE — Telephone Encounter (Signed)
Patient does not qualify for TCM due to [employer]. Tentative hold removed from schedule. Nurse called back to encourage scheduling hfu with PMD. No answer.

## 2019-07-21 NOTE — Telephone Encounter (Signed)
First failed attempt to reach patient for transition of care. No answer, left message stating nurse would call back. Tentative appointment held on PMD schedule 07/23/19 @ 11:00. Please schedule for hospital follow up as appropriate.

## 2020-04-28 ENCOUNTER — Emergency Department
Admission: EM | Admit: 2020-04-28 | Discharge: 2020-04-28 | Disposition: A | Discharge status: Home / Self Care | Payer: Medicare Other | Attending: Emergency Medicine | Admitting: Emergency Medicine

## 2020-04-28 ENCOUNTER — Other Ambulatory Visit: Payer: Self-pay

## 2020-04-28 ENCOUNTER — Emergency Department: Payer: Medicare Other

## 2020-04-28 ENCOUNTER — Encounter: Payer: Self-pay | Admitting: Emergency Medicine

## 2020-04-28 DIAGNOSIS — S0990XA Unspecified injury of head, initial encounter: Secondary | ICD-10-CM | POA: Diagnosis present

## 2020-04-28 DIAGNOSIS — S0101XA Laceration without foreign body of scalp, initial encounter: Secondary | ICD-10-CM | POA: Insufficient documentation

## 2020-04-28 DIAGNOSIS — Y92007 Garden or yard of unspecified non-institutional (private) residence as the place of occurrence of the external cause: Secondary | ICD-10-CM | POA: Insufficient documentation

## 2020-04-28 DIAGNOSIS — W01198A Fall on same level from slipping, tripping and stumbling with subsequent striking against other object, initial encounter: Secondary | ICD-10-CM | POA: Insufficient documentation

## 2020-04-28 DIAGNOSIS — I10 Essential (primary) hypertension: Secondary | ICD-10-CM | POA: Insufficient documentation

## 2020-04-28 DIAGNOSIS — Y9301 Activity, walking, marching and hiking: Secondary | ICD-10-CM | POA: Diagnosis not present

## 2020-04-28 MED ORDER — LIDOCAINE-EPINEPHRINE 2 %-1:100000 IJ SOLN: 30.0000 mL | Freq: Once | INTRAMUSCULAR | Status: DC

## 2020-04-28 NOTE — ED Triage Notes (Signed)
Pt to ED via POV with her son, pt states was trying to get a flag out of someone else's yard and put it in her yard and fell. Pt denies LOC at this time. Pt with laceration to posterior L head. Bleeding controlled, dried blood noted. Pt A&O and appropriate on arrival to ED.

## 2020-04-28 NOTE — Discharge Instructions (Signed)
You have 3 sutures that need removal in about 12 to 14 days.

## 2020-04-28 NOTE — ED Provider Notes (Signed)
[facility] Emergency Department Provider Note   ____________________________________________   Event Date/Time   First MD Initiated Contact with Patient 04/28/20 1738     (approximate)  I have reviewed the triage vital signs and the nursing notes.   HISTORY  Chief Complaint Fall and Head Laceration    HPI Rebekah Bridges is a 87 y.o. female history of osteoporosis.  She takes no medications   About 4:30 PM today, patient was outside.  She is in her yard and she walked over to the neighbor's yard to pick something up and while doing so she fell forward and struck her head.  She is bleeding slightly from the left side of her scalp.  Did not lose consciousness no preceding symptoms.  Reports that she simply stumbled forward and struck her head.  No neck pain  She feels well otherwise.  The area has continued to bleed some and prompted her to come for evaluation  No nausea no vomiting no headache.  No neck pain no numbness or weakness.  She been in her normal health.   Past Medical History:  Diagnosis Date  . Osteoporosis    s/p vertebroplasty for fracutres x 3    Patient Active Problem List   Diagnosis Date Noted  . Acute pancreatitis 07/17/2019  . Unspecified atrial fibrillation (HCC) 07/17/2019  . Essential hypertension 02/06/2014  . Back pain 01/30/2014  . Neuropathy 01/30/2014  . PAD (peripheral artery disease) (HCC) 08/28/2012  . History of headache 08/28/2012  . HYPERLIPIDEMIA 01/24/2010  . OSTEOARTHRITIS 01/24/2010  . BACK PAIN 01/24/2010  . OSTEOPOROSIS 01/24/2010    Past Surgical History:  Procedure Laterality Date  . BLADDER AUGMENTATION  1964  . SPINE SURGERY  2010  . SPINE SURGERY  2011  . SPINE SURGERY  2012    Prior to Admission medications   Medication Sig Start Date End Date Taking? Authorizing Provider  calcium carbonate (TUMS - DOSED IN MG ELEMENTAL CALCIUM) 500 MG chewable tablet Chew 1 tablet by mouth as needed  for indigestion or heartburn.    [provider]  Cholecalciferol 25 MCG (1000 UT) capsule Take by mouth.    [provider]  gabapentin (NEURONTIN) 100 MG capsule Take 1 capsule (100 mg total) by mouth 3 (three) times daily. 01/28/14   Sherlene Shams, MD  ibuprofen (ADVIL) 200 MG tablet Take by mouth every 8 (eight) hours as needed.     [provider]  Multiple Vitamin (MULTIVITAMIN WITH MINERALS) TABS tablet Take 1 tablet by mouth daily.    [provider]  Red Yeast Rice Extract 600 MG CAPS Take by mouth.    [provider]  Turmeric (QC TUMERIC COMPLEX) 500 MG CAPS Take by mouth.    [provider]  Patient reports that she is no longer taking any prescription medications  Allergies Penicillins, Tramadol hcl, Tramadol hcl, and Sulfa antibiotics  Family History  Problem Relation Age of Onset  . Cancer Father   . Learning disabilities Paternal Grandmother     Social History Social History   Tobacco Use  . Smoking status: Never Smoker  . Smokeless tobacco: Never Used  Substance Use Topics  . Alcohol use: Yes  . Drug use: No    Review of Systems Constitutional: No fever/chills or recent illness Eyes: No visual changes. ENT: No neck pain Cardiovascular: Denies chest pain. Respiratory: Denies shortness of breath. Gastrointestinal: No abdominal pain.   Musculoskeletal: Negative for back pain.  Denies  any injury other than striking her head.  No pain or discomfort in the arms or legs.  Does have arthritis was.  Able to walk after injury.  No hip pain Skin: Negative for rash. Neurological: Negative for headaches, areas of focal weakness or numbness.  A little sore over the area where the laceration is    ____________________________________________   PHYSICAL EXAM:  VITAL SIGNS: ED Triage Vitals  Enc Vitals Group     BP 04/28/20 1728 (!) 216/109     Pulse Rate 04/28/20 1728 89     Resp 04/28/20 1728 20     Temp  04/28/20 1728 97.7 F (36.5 C)     Temp Source 04/28/20 1728 Oral     SpO2 04/28/20 1728 96 %     Weight 04/28/20 1729 130 lb 1.1 oz (59 kg)     Height 04/28/20 1729 4\' 11"  (1.499 m)     Head Circumference --      Peak Flow --      Pain Score 04/28/20 1729 5     Pain Loc --      Pain Edu? --      Excl. in GC? --     Constitutional: Alert and oriented. Well appearing and in no acute distress. Eyes: Conjunctivae are normal. Head: Atraumatic except for very small amount of contusion over the left parietal scalp, a moderate size approximately 2 and half centimeter stellate appearing laceration with bleeding controlled.  No foreign bodies.  The galea does not appear to be injured. Nose: No congestion/rhinnorhea. Mouth/Throat: Mucous membranes are moist. Neck: No stridor.  No cervical tenderness.  Full range of motion of neck without pain or discomfort. Cardiovascular: Normal rate, regular rhythm. Grossly normal heart sounds.  Good peripheral circulation. Respiratory: Normal respiratory effort.  No retractions. Lungs CTAB. Gastrointestinal: Soft and nontender. No distention. Musculoskeletal: No lower extremity tenderness nor edema.  Demonstrates use of all extremities well.  Able to ambulate with walker without difficulty or pain. Neurologic:  Normal speech and language. No gross focal neurologic deficits are appreciated.  Skin:  Skin is warm, dry and intact. No rash noted. Psychiatric: Mood and affect are normal. Speech and behavior are normal.  ____________________________________________   LABS (all labs ordered are listed, but only abnormal results are displayed)  Labs Reviewed - No data to display ____________________________________________  EKG   ____________________________________________  RADIOLOGY  CT Head Wo Contrast  Result Date: 04/28/2020 CLINICAL DATA:  Status post fall. EXAM: CT HEAD WITHOUT CONTRAST TECHNIQUE: Contiguous axial images were obtained from the  base of the skull through the vertex without intravenous contrast. COMPARISON:  None. FINDINGS: Brain: There is mild cerebral atrophy with widening of the extra-axial spaces and ventricular dilatation. There are areas of decreased attenuation within the white matter tracts of the supratentorial brain, consistent with microvascular disease changes. Vascular: No hyperdense vessel or unexpected calcification. Skull: Normal. Negative for fracture or focal lesion. Sinuses/Orbits: Mild bilateral ethmoid sinus mucosal thickening. Other: Mild to moderate severity scalp soft tissue swelling is seen along the posterior aspect of the vertex on the left. IMPRESSION: 1. Mild to moderate severity scalp soft tissue swelling along the posterior aspect of the vertex on the left. 2. No acute intracranial abnormality. Electronically Signed   By: Aram Candelahaddeus  Houston M.D.   On: 04/28/2020 19:29    CT head reviewed, negative for acute intracranial abnormality.  Scalp swelling noted ____________________________________________   PROCEDURES  Procedure(s) performed: Laceration  .Marland Kitchen.Laceration Repair  Date/Time: 04/28/2020 8:09  PM Performed by: Sharyn Creamer, MD Authorized by: Sharyn Creamer, MD   Consent:    Consent obtained:  Verbal   Consent given by:  Patient   Risks, benefits, and alternatives were discussed: yes     Risks discussed:  Infection, pain, retained foreign body, poor cosmetic result and poor wound healing Universal protocol:    Procedure explained and questions answered to patient or proxy's satisfaction: yes     Relevant documents present and verified: yes     Test results available: yes     Imaging studies available: yes     Patient identity confirmed:  Verbally with patient Anesthesia:    Anesthesia method:  Local infiltration   Local anesthetic:  Lidocaine 1% WITH epi (6 ml) Laceration details:    Location:  Scalp   Scalp location:  L parietal   Length (cm):  2.5   Depth (mm):  4 Pre-procedure  details:    Preparation:  Imaging obtained to evaluate for foreign bodies and patient was prepped and draped in usual sterile fashion Exploration:    Limited defect created (wound extended): yes     Hemostasis achieved with:  Direct pressure   Contaminated: no   Treatment:    Area cleansed with:  Saline   Amount of cleaning:  Extensive   Irrigation solution:  Sterile saline   Irrigation method:  Syringe   Visualized foreign bodies/material removed: no     Debridement:  Minimal Skin repair:    Repair method:  Sutures   Suture size:  2-0   Suture material:  Prolene   Suture technique:  Horizontal mattress   Number of sutures:  3 Approximation:    Approximation:  Close Repair type:    Repair type:  Simple Post-procedure details:    Dressing:  Sterile dressing   Procedure completion:  Tolerated well, no immediate complications    Critical Care performed: No  ____________________________________________   INITIAL IMPRESSION / ASSESSMENT AND PLAN / ED COURSE  Pertinent labs & imaging results that were available during my care of the patient were reviewed by me and considered in my medical decision making (see chart for details).   Patient suffered a mechanical fall.  Takes no anticoagulants.  No loss conscious.  No preceding symptoms.  Fully awake and alert without distress.  Moderate laceration over the left parietal scalp.  Discussed with the patient and her son, does not appear there is any sort of syncopal episode or preceding symptoms.  Appears to been a mechanical fall with laceration.  Wishes for repair of the laceration which was performed with excellent effect.  Careful return precautions treatment recommendations discussed with the patient and her son.  CT of the head reassuring.  Patient alert ambulatory uses walker, fully oriented without distress  Did discuss with the patient her significantly elevated blood pressure, both patient and her son do not wish for this to  be addressed.  Reports she always has significant high blood pressure at doctor's visits, today likely being no different.  I think this is reasonable, at her age and with her goal of not taking any medications etc. and I think this is all quite reasonable and she will follow up with her primary doctor.  Return precautions and treatment recommendations and follow-up discussed with the patient who is agreeable with the plan.       ____________________________________________   FINAL CLINICAL IMPRESSION(S) / ED DIAGNOSES  Final diagnoses:  Injury of head, initial encounter  Scalp laceration, initial  encounter        Note:  This document was prepared using Dragon voice recognition software and may include unintentional dictation errors       Sharyn Creamer, MD 04/28/20 2013

## 2020-05-16 ENCOUNTER — Ambulatory Visit (INDEPENDENT_AMBULATORY_CARE_PROVIDER_SITE_OTHER): Payer: Medicare Other | Admitting: Podiatry

## 2020-05-16 ENCOUNTER — Encounter: Payer: Self-pay | Admitting: Podiatry

## 2020-05-16 ENCOUNTER — Other Ambulatory Visit: Payer: Self-pay

## 2020-05-16 DIAGNOSIS — R2681 Unsteadiness on feet: Secondary | ICD-10-CM | POA: Diagnosis not present

## 2020-05-16 DIAGNOSIS — L97511 Non-pressure chronic ulcer of other part of right foot limited to breakdown of skin: Secondary | ICD-10-CM

## 2020-05-16 MED ORDER — DOXYCYCLINE HYCLATE 100 MG PO TABS
100.0000 mg | ORAL_TABLET | Freq: Two times a day (BID) | ORAL | 0 refills | Status: DC
Start: 2020-05-16 — End: 2020-06-01

## 2020-05-16 MED ORDER — IODOSORB 0.9 % EX GEL
1.0000 | Freq: Every day | CUTANEOUS | 0 refills | Status: DC | PRN
Start: 2020-05-16 — End: 2020-06-01

## 2020-05-16 NOTE — Progress Notes (Signed)
Subjective:  Patient ID: Rebekah Bridges, female    DOB: Jan 05, 1916,  MRN: [id number] HPI Chief Complaint  Patient presents with  . Foot Ulcer    1st MPJ (medial) right - wound x several months, but starting to hurt some, dry skin-lotion?, PCP wanted feet examined  . New Patient (Initial Visit)    87 y.o. female presents with the above complaint.   ROS: Denies fever chills nausea vomiting muscle aches or pains.  States that she has severe neuropathy bilaterally.  Past Medical History:  Diagnosis Date  . Osteoporosis    s/p vertebroplasty for fracutres x 3   Past Surgical History:  Procedure Laterality Date  . BLADDER AUGMENTATION  1964  . SPINE SURGERY  2010  . SPINE SURGERY  2011  . SPINE SURGERY  2012    Current Outpatient Medications:  .  cadexomer iodine (IODOSORB) 0.9 % gel, Apply 1 application topically daily as needed for wound care., Disp: 40 g, Rfl: 0 .  doxycycline (VIBRA-TABS) 100 MG tablet, Take 1 tablet (100 mg total) by mouth 2 (two) times daily., Disp: 20 tablet, Rfl: 0 .  calcium carbonate (TUMS - DOSED IN MG ELEMENTAL CALCIUM) 500 MG chewable tablet, Chew 1 tablet by mouth as needed for indigestion or heartburn., Disp: , Rfl:  .  Cholecalciferol 25 MCG (1000 UT) capsule, Take by mouth., Disp: , Rfl:  .  ibuprofen (ADVIL) 200 MG tablet, Take by mouth every 8 (eight) hours as needed. , Disp: , Rfl:  .  Multiple Vitamin (MULTIVITAMIN WITH MINERALS) TABS tablet, Take 1 tablet by mouth daily., Disp: , Rfl:  .  Red Yeast Rice Extract 600 MG CAPS, Take by mouth., Disp: , Rfl:  .  Turmeric (QC TUMERIC COMPLEX) 500 MG CAPS, Take by mouth., Disp: , Rfl:   Allergies  Allergen Reactions  . Hydrocodone   . Meloxicam   . Other Other (See Comments)  . Penicillins Nausea Only    And a rash  . Tramadol Hcl Other (See Comments)    REACTION: nausea and sweating  . Tramadol Hcl Other (See Comments)    REACTION: nausea and sweating  . Sulfa Antibiotics Nausea Only, Hives  and Rash    And rash   Review of Systems Objective:  There were no vitals filed for this visit.  General: Well developed, nourished, in no acute distress, alert and oriented x3   Dermatological: Skin is warm, dry and supple bilateral. Nails x 10 are well maintained; remaining integument appears unremarkable at this time. There are no open sores, no preulcerative lesions, no rash or signs of infection present.  Open wound dry scaly area medial aspect of the right hallux at the metatarsophalangeal joint.  Wound measures approximately 4 mm in diameter and probes deep to capsule.  No purulence no malodor  Vascular: Dorsalis Pedis artery and Posterior Tibial artery pedal pulses are 0/4 bilateral with delayed capillary fill time. Pedal hair growth present. No varicosities and no lower extremity edema present bilateral.   Neruologic: Grossly intact via light touch bilateral. Vibratory intact via tuning fork bilateral. Protective threshold with Semmes Wienstein monofilament diminished to all pedal sites bilateral. Patellar and Achilles deep tendon reflexes 2+ bilateral. No Babinski or clonus noted bilateral.   Musculoskeletal: No gross boney pedal deformities bilateral. No pain, crepitus, or limitation noted with foot and ankle range of motion bilateral. Muscular strength 5/5 in all groups tested bilateral.  Gait: Unassisted, Nonantalgic.    Radiographs:  None taken today  Assessment & Plan:   Assessment: Severe peripheral vascular disease right lower extremity greater than the left open wound medial aspect of the head of the first metatarsal right.  Plan: Started her on doxycycline Iodosorb gel and sending her for vascular evaluation also placed her in a Darco shoe.  Follow-up with her in 2 weeks     Tiare Rohlman T. [place], [place]

## 2020-05-17 ENCOUNTER — Ambulatory Visit (HOSPITAL_COMMUNITY)
Admission: RE | Admit: 2020-05-17 | Discharge: 2020-05-17 | Disposition: A | Discharge status: Home / Self Care | Payer: Medicare Other | Source: Ambulatory Visit | Attending: Podiatry | Admitting: Podiatry

## 2020-05-17 DIAGNOSIS — L97511 Non-pressure chronic ulcer of other part of right foot limited to breakdown of skin: Secondary | ICD-10-CM

## 2020-05-18 ENCOUNTER — Telehealth: Payer: Self-pay | Admitting: *Deleted

## 2020-05-18 DIAGNOSIS — L97511 Non-pressure chronic ulcer of other part of right foot limited to breakdown of skin: Secondary | ICD-10-CM

## 2020-05-18 DIAGNOSIS — R0989 Other specified symptoms and signs involving the circulatory and respiratory systems: Secondary | ICD-10-CM

## 2020-05-18 NOTE — Telephone Encounter (Signed)
-----   Message from Elinor Parkinson, [place] sent at 05/18/2020  7:19 AM EDT ----- Sidonie Dickens make sure that she has an appointment with vascular for consult.

## 2020-05-18 NOTE — Telephone Encounter (Signed)
Ordered vascular consult

## 2020-05-24 ENCOUNTER — Encounter: Payer: Self-pay | Admitting: Vascular Surgery

## 2020-05-24 ENCOUNTER — Other Ambulatory Visit: Payer: Self-pay

## 2020-05-24 ENCOUNTER — Ambulatory Visit (INDEPENDENT_AMBULATORY_CARE_PROVIDER_SITE_OTHER): Payer: Medicare Other | Admitting: Vascular Surgery

## 2020-05-24 VITALS — BP 180/103 | HR 74 | Temp 97.8°F | Resp 14 | Ht 59.0 in | Wt 122.0 lb

## 2020-05-24 DIAGNOSIS — I739 Peripheral vascular disease, unspecified: Secondary | ICD-10-CM | POA: Diagnosis not present

## 2020-05-24 NOTE — Progress Notes (Signed)
Patient name: Rebekah Bridges MRN: [id number] DOB: 1915-12-25 Sex: female  REASON FOR CONSULT: Right great toe wound  HPI: Rebekah Bridges is a 87 y.o. female, with no significant medical history that presents for evaluation of right great toe wound.  Patient states that this has been developing over the last several months.  She has an ulceration over her right big toe.  She said no previous lower extremity interventions.  Although she is 89 she is very independent and ambulatory and functional.  States aside from her toe has no other medical problems.   Past Medical History:  Diagnosis Date  . Osteoporosis    s/p vertebroplasty for fracutres x 3    Past Surgical History:  Procedure Laterality Date  . BLADDER AUGMENTATION  1964  . SPINE SURGERY  2010  . SPINE SURGERY  2011  . SPINE SURGERY  2012    Family History  Problem Relation Age of Onset  . Cancer Father   . Learning disabilities Paternal Grandmother     SOCIAL HISTORY: Social History   Socioeconomic History  . Marital status: Widowed    Spouse name: Not on file  . Number of children: Not on file  . Years of education: Not on file  . Highest education level: Not on file  Occupational History  . Not on file  Tobacco Use  . Smoking status: Never Smoker  . Smokeless tobacco: Never Used  Substance and Sexual Activity  . Alcohol use: Yes  . Drug use: No  . Sexual activity: Not on file  Other Topics Concern  . Not on file  Social History Narrative  . Not on file   Social Determinants of Health   Financial Resource Strain: Not on file  Food Insecurity: Not on file  Transportation Needs: Not on file  Physical Activity: Not on file  Stress: Not on file  Social Connections: Not on file  Intimate Partner Violence: Not on file    Allergies  Allergen Reactions  . Hydrocodone   . Meloxicam   . Other Other (See Comments)  . Penicillins Nausea Only    And a rash  . Tramadol Hcl Other (See Comments)     REACTION: nausea and sweating  . Tramadol Hcl Other (See Comments)    REACTION: nausea and sweating  . Sulfa Antibiotics Nausea Only, Hives and Rash    And rash    Current Outpatient Medications  Medication Sig Dispense Refill  . cadexomer iodine (IODOSORB) 0.9 % gel Apply 1 application topically daily as needed for wound care. 40 g 0  . calcium carbonate (TUMS - DOSED IN MG ELEMENTAL CALCIUM) 500 MG chewable tablet Chew 1 tablet by mouth as needed for indigestion or heartburn.    . Cholecalciferol 25 MCG (1000 UT) capsule Take by mouth.    . doxycycline (VIBRA-TABS) 100 MG tablet Take 1 tablet (100 mg total) by mouth 2 (two) times daily. 20 tablet 0  . ibuprofen (ADVIL) 200 MG tablet Take by mouth every 8 (eight) hours as needed.     . Multiple Vitamin (MULTIVITAMIN WITH MINERALS) TABS tablet Take 1 tablet by mouth daily.    . Red Yeast Rice Extract 600 MG CAPS Take by mouth.    . Turmeric 500 MG CAPS Take by mouth.     No current facility-administered medications for this visit.    REVIEW OF SYSTEMS:  [X]  denotes positive finding, [ ]  denotes negative finding Cardiac  Comments:  Chest pain  or chest pressure:    Shortness of breath upon exertion:    Short of breath when lying flat:    Irregular heart rhythm:        Vascular    Pain in calf, thigh, or hip brought on by ambulation: x   Pain in feet at night that wakes you up from your sleep:  x   Blood clot in your veins:    Leg swelling:         Pulmonary    Oxygen at home:    Productive cough:     Wheezing:         Neurologic    Sudden weakness in arms or legs:     Sudden numbness in arms or legs:     Sudden onset of difficulty speaking or slurred speech:    Temporary loss of vision in one eye:     Problems with dizziness:         Gastrointestinal    Blood in stool:     Vomited blood:         Genitourinary    Burning when urinating:     Blood in urine:        Psychiatric    Major depression:          Hematologic    Bleeding problems:    Problems with blood clotting too easily:        Skin    Rashes or ulcers:        Constitutional    Fever or chills:      PHYSICAL EXAM: Vitals:   05/24/20 1455  BP: (!) 180/103  Pulse: 74  Resp: 14  Temp: 97.8 F (36.6 C)  TempSrc: Temporal  SpO2: 97%  Weight: 122 lb (55.3 kg)  Height: 4\' 11"  (1.499 m)    GENERAL: The patient is a well-nourished female, in no acute distress. The vital signs are documented above. CARDIAC: There is a regular rate and rhythm.  VASCULAR:  Palpable femoral pulses bilaterally No palpable pedal pulses Ulceration on the right metatarsal head as pictured below with some rubor in the foot PULMONARY: No respiratory distress. ABDOMEN: Soft and non-tender.  MUSCULOSKELETAL: There are no major deformities or cyanosis. NEUROLOGIC: No focal weakness or paresthesias are detected. PSYCHIATRIC: The patient has a normal affect.      DATA:   ABIs today are 0.57 on the right monophasic and 0.94 on the left monophasic  Assessment/Plan:  87 year old female that presents with critical limb ischemia of the right lower extremity with tissue loss.  She has this wound as pictured above on her right metatarsal head with severely depressed ABIs 0.57 and abnormal monophasic waveforms at the ankle.  I have recommended aortogram with right lower extremity arteriogram and possible intervention.  She is a very independent and functional.  We discussed evaluating for endovascular options.  We discussed sometimes there are long segment occlusions that require open surgery but in discussion with her and her daughter we all agreed we would like to avoid open surgery if possible.  Risk benefits discussed   101, MD [facility] Office: [phone]

## 2020-05-25 ENCOUNTER — Other Ambulatory Visit
Admission: RE | Admit: 2020-05-25 | Discharge: 2020-05-25 | Disposition: A | Discharge status: Home / Self Care | Payer: Medicare Other | Source: Ambulatory Visit | Attending: Family Medicine | Admitting: Family Medicine

## 2020-05-25 DIAGNOSIS — Z20822 Contact with and (suspected) exposure to covid-19: Secondary | ICD-10-CM | POA: Diagnosis not present

## 2020-05-25 DIAGNOSIS — Z01812 Encounter for preprocedural laboratory examination: Secondary | ICD-10-CM | POA: Diagnosis present

## 2020-05-25 LAB — SARS CORONAVIRUS 2 (TAT 6-24 HRS): SARS Coronavirus 2: NEGATIVE

## 2020-05-26 ENCOUNTER — Encounter (HOSPITAL_COMMUNITY)
Admission: RE | Disposition: A | Discharge status: Home / Self Care | Payer: Self-pay | Source: Home / Self Care | Attending: Vascular Surgery

## 2020-05-26 ENCOUNTER — Ambulatory Visit (HOSPITAL_COMMUNITY)
Admission: RE | Admit: 2020-05-26 | Discharge: 2020-05-26 | Disposition: A | Discharge status: Home / Self Care | Payer: Medicare Other | Attending: Vascular Surgery | Admitting: Vascular Surgery

## 2020-05-26 ENCOUNTER — Other Ambulatory Visit: Payer: Self-pay

## 2020-05-26 DIAGNOSIS — Z880 Allergy status to penicillin: Secondary | ICD-10-CM | POA: Diagnosis not present

## 2020-05-26 DIAGNOSIS — L97519 Non-pressure chronic ulcer of other part of right foot with unspecified severity: Secondary | ICD-10-CM | POA: Insufficient documentation

## 2020-05-26 DIAGNOSIS — Z882 Allergy status to sulfonamides status: Secondary | ICD-10-CM | POA: Diagnosis not present

## 2020-05-26 DIAGNOSIS — I739 Peripheral vascular disease, unspecified: Secondary | ICD-10-CM

## 2020-05-26 DIAGNOSIS — I70235 Atherosclerosis of native arteries of right leg with ulceration of other part of foot: Secondary | ICD-10-CM | POA: Diagnosis not present

## 2020-05-26 DIAGNOSIS — Z885 Allergy status to narcotic agent status: Secondary | ICD-10-CM | POA: Diagnosis not present

## 2020-05-26 DIAGNOSIS — Z888 Allergy status to other drugs, medicaments and biological substances status: Secondary | ICD-10-CM | POA: Diagnosis not present

## 2020-05-26 HISTORY — PX: ABDOMINAL AORTOGRAM W/LOWER EXTREMITY: CATH118223

## 2020-05-26 LAB — POCT I-STAT, CHEM 8
BUN: 31 mg/dL — ABNORMAL HIGH (ref 8–23)
Calcium, Ion: 1.27 mmol/L (ref 1.15–1.40)
Chloride: 100 mmol/L (ref 98–111)
Creatinine, Ser: 0.8 mg/dL (ref 0.44–1.00)
Glucose, Bld: 90 mg/dL (ref 70–99)
HCT: 38 % (ref 36.0–46.0)
Hemoglobin: 12.9 g/dL (ref 12.0–15.0)
Potassium: 4.3 mmol/L (ref 3.5–5.1)
Sodium: 138 mmol/L (ref 135–145)
TCO2: 29 mmol/L (ref 22–32)

## 2020-05-26 SURGERY — ABDOMINAL AORTOGRAM W/LOWER EXTREMITY
Anesthesia: LOCAL

## 2020-05-26 MED ORDER — SODIUM CHLORIDE 0.9% FLUSH: 3.0000 mL | Freq: Two times a day (BID) | INTRAVENOUS | Status: DC

## 2020-05-26 MED ORDER — HYDRALAZINE HCL 20 MG/ML IJ SOLN: 5.0000 mg | INTRAMUSCULAR | Status: DC | PRN

## 2020-05-26 MED ORDER — LIDOCAINE HCL (PF) 1 % IJ SOLN
INTRAMUSCULAR | Status: DC | PRN
  Administered 2020-05-26: 20 mL

## 2020-05-26 MED ORDER — HEPARIN (PORCINE) IN NACL 1000-0.9 UT/500ML-% IV SOLN
INTRAVENOUS | Status: AC
  Filled 2020-05-26: qty 500

## 2020-05-26 MED ORDER — FENTANYL CITRATE (PF) 100 MCG/2ML IJ SOLN
INTRAMUSCULAR | Status: DC | PRN
  Administered 2020-05-26: 25 ug via INTRAVENOUS

## 2020-05-26 MED ORDER — SODIUM CHLORIDE 0.9 % WEIGHT BASED INFUSION: 1.0000 mL/kg/h | INTRAVENOUS | Status: DC

## 2020-05-26 MED ORDER — SODIUM CHLORIDE 0.9 % IV SOLN: 250.0000 mL | INTRAVENOUS | Status: DC | PRN

## 2020-05-26 MED ORDER — HEPARIN SODIUM (PORCINE) 1000 UNIT/ML IJ SOLN
INTRAMUSCULAR | Status: DC | PRN
  Administered 2020-05-26: 6000 [IU] via INTRAVENOUS

## 2020-05-26 MED ORDER — ONDANSETRON HCL 4 MG/2ML IJ SOLN: 4.0000 mg | Freq: Four times a day (QID) | INTRAMUSCULAR | Status: DC | PRN

## 2020-05-26 MED ORDER — SODIUM CHLORIDE 0.9% FLUSH: 3.0000 mL | INTRAVENOUS | Status: DC | PRN

## 2020-05-26 MED ORDER — FENTANYL CITRATE (PF) 100 MCG/2ML IJ SOLN
INTRAMUSCULAR | Status: AC
  Filled 2020-05-26: qty 2

## 2020-05-26 MED ORDER — ACETAMINOPHEN 325 MG PO TABS: 650.0000 mg | ORAL_TABLET | ORAL | Status: DC | PRN

## 2020-05-26 MED ORDER — HYDRALAZINE HCL 20 MG/ML IJ SOLN
INTRAMUSCULAR | Status: AC
  Filled 2020-05-26: qty 1

## 2020-05-26 MED ORDER — MIDAZOLAM HCL 2 MG/2ML IJ SOLN
INTRAMUSCULAR | Status: DC | PRN
  Administered 2020-05-26: 1 mg via INTRAVENOUS

## 2020-05-26 MED ORDER — IODIXANOL 320 MG/ML IV SOLN
INTRAVENOUS | Status: DC | PRN
  Administered 2020-05-26: 140 mL via INTRA_ARTERIAL

## 2020-05-26 MED ORDER — LIDOCAINE HCL (PF) 1 % IJ SOLN
INTRAMUSCULAR | Status: AC
  Filled 2020-05-26: qty 30

## 2020-05-26 MED ORDER — LABETALOL HCL 5 MG/ML IV SOLN
10.0000 mg | INTRAVENOUS | Status: DC | PRN
Start: 2020-05-26 — End: 2020-05-26

## 2020-05-26 MED ORDER — SODIUM CHLORIDE 0.9 % IV SOLN: INTRAVENOUS | Status: DC

## 2020-05-26 MED ORDER — HYDRALAZINE HCL 20 MG/ML IJ SOLN
INTRAMUSCULAR | Status: DC | PRN
  Administered 2020-05-26: 10 mg via INTRAVENOUS

## 2020-05-26 MED ORDER — HEPARIN (PORCINE) IN NACL 1000-0.9 UT/500ML-% IV SOLN
INTRAVENOUS | Status: DC | PRN
  Administered 2020-05-26 (×2): 500 mL

## 2020-05-26 MED ORDER — MIDAZOLAM HCL 2 MG/2ML IJ SOLN
INTRAMUSCULAR | Status: AC
  Filled 2020-05-26: qty 2

## 2020-05-26 MED ORDER — ASPIRIN EC 81 MG PO TBEC: 81.0000 mg | DELAYED_RELEASE_TABLET | Freq: Every day | ORAL | Status: DC

## 2020-05-26 SURGICAL SUPPLY — 15 items
CATH NAVICROSS ST .035X90CM (MICROCATHETER) ×2 IMPLANT
CATH OMNI FLUSH 5F 65CM (CATHETERS) ×2 IMPLANT
DEVICE CLOSURE MYNXGRIP 5F (Vascular Products) ×2 IMPLANT
GLIDEWIRE ADV .035X260CM (WIRE) ×2 IMPLANT
GUIDEWIRE ANGLED .035X260CM (WIRE) ×2 IMPLANT
KIT MICROPUNCTURE NIT STIFF (SHEATH) ×2 IMPLANT
KIT PV (KITS) ×2 IMPLANT
SHEATH FLEX ANSEL ANG 5F 45CM (SHEATH) ×2 IMPLANT
SHEATH PINNACLE 5F 10CM (SHEATH) ×2 IMPLANT
SHEATH PROBE COVER 6X72 (BAG) ×2 IMPLANT
SYR MEDRAD MARK V 150ML (SYRINGE) ×2 IMPLANT
TRANSDUCER W/STOPCOCK (MISCELLANEOUS) ×2 IMPLANT
TRAY PV CATH (CUSTOM PROCEDURE TRAY) ×2 IMPLANT
WIRE BENTSON .035X145CM (WIRE) ×2 IMPLANT
WIRE G V18X300CM (WIRE) ×2 IMPLANT

## 2020-05-26 NOTE — Progress Notes (Signed)
Discharge instructions reviewed  with pt and her daughter in law both voice understanding.  

## 2020-05-26 NOTE — Discharge Instructions (Signed)
Femoral Site Care  This sheet gives you information about how to care for yourself after your procedure. Your health care provider may also give you more specific instructions. If you have problems or questions, contact your health care provider. What can I expect after the procedure? After the procedure, it is common to have:  Bruising that usually fades within 1-2 weeks.  Tenderness at the site. Follow these instructions at home: Wound care  Follow instructions from your health care provider about how to take care of your insertion site. Make sure you: ? Wash your hands with soap and water before you change your bandage (dressing). If soap and water are not available, use hand sanitizer. ? Change your dressing as told by your health care provider. ? Leave stitches (sutures), skin glue, or adhesive strips in place. These skin closures may need to stay in place for 2 weeks or longer. If adhesive strip edges start to loosen and curl up, you may trim the loose edges. Do not remove adhesive strips completely unless your health care provider tells you to do that.  Do not take baths, swim, or use a hot tub until your health care provider approves.  You may shower 24-48 hours after the procedure or as told by your health care provider. ? Gently wash the site with plain soap and water. ? Pat the area dry with a clean towel. ? Do not rub the site. This may cause bleeding.  Do not apply powder or lotion to the site. Keep the site clean and dry.  Check your femoral site every day for signs of infection. Check for: ? Redness, swelling, or pain. ? Fluid or blood. ? Warmth. ? Pus or a bad smell. Activity  For the first 2-3 days after your procedure, or as long as directed: ? Avoid climbing stairs as much as possible. ? Do not squat.  Do not lift anything that is heavier than 10 lb (4.5 kg), or the limit that you are told, until your health care provider says that it is safe.  Rest as  directed. ? Avoid sitting for a long time without moving. Get up to take short walks every 1-2 hours.  Do not drive for 24 hours if you were given a medicine to help you relax (sedative). General instructions  Take over-the-counter and prescription medicines only as told by your health care provider.  Keep all follow-up visits as told by your health care provider. This is important. Contact a health care provider if you have:  A fever or chills.  You have redness, swelling, or pain around your insertion site. Get help right away if:  The catheter insertion area swells very fast.  You pass out.  You suddenly start to sweat or your skin gets clammy.  The catheter insertion area is bleeding, and the bleeding does not stop when you hold steady pressure on the area.  The area near or just beyond the catheter insertion site becomes pale, cool, tingly, or numb. These symptoms may represent a serious problem that is an emergency. Do not wait to see if the symptoms will go away. Get medical help right away. Call your local emergency services (911 in the [place] Do not drive yourself to the hospital. Summary  After the procedure, it is common to have bruising that usually fades within 1-2 weeks.  Check your femoral site every day for signs of infection.  Do not lift anything that is heavier than 10 lb (4.5 kg), or   the limit that you are told, until your health care provider says that it is safe. This information is not intended to replace advice given to you by your health care provider. Make sure you discuss any questions you have with your health care provider. Document Revised: 10/09/2019 Document Reviewed: 10/09/2019 Elsevier Patient Education  2021 [employer]  

## 2020-05-26 NOTE — H&P (Signed)
History and Physical Interval Note:  05/26/2020 1:51 PM  Rebekah Bridges  has presented today for surgery, with the diagnosis of pad.  The various methods of treatment have been discussed with the patient and family. After consideration of risks, benefits and other options for treatment, the patient has consented to  Procedure(s): ABDOMINAL AORTOGRAM W/LOWER EXTREMITY (N/A) as a surgical intervention.  The patient's history has been reviewed, patient examined, no change in status, stable for surgery.  I have reviewed the patient's chart and labs.  Questions were answered to the patient's satisfaction.    Aortogram, lower extremity arteriogram, right foot wound.  Rebekah Bridges  Patient name: Rebekah Bridges         MRN: [id number]        DOB: [birth year]          Sex: female  REASON FOR CONSULT: Right great toe wound  HPI: Rebekah Bridges is a 87 y.o. female, with no significant medical history that presents for evaluation of right great toe wound.  Patient states that this has been developing over the last several months.  She has an ulceration over her right big toe.  She said no previous lower extremity interventions.  Although she is 89 she is very independent and ambulatory and functional.  States aside from her toe has no other medical problems.       Past Medical History:  Diagnosis Date  . Osteoporosis    s/p vertebroplasty for fracutres x 3         Past Surgical History:  Procedure Laterality Date  . BLADDER AUGMENTATION  1964  . SPINE SURGERY  2010  . SPINE SURGERY  2011  . SPINE SURGERY  2012         Family History  Problem Relation Age of Onset  . Cancer Father   . Learning disabilities Paternal Grandmother     SOCIAL HISTORY: Social History        Socioeconomic History  . Marital status: Widowed    Spouse name: Not on file  . Number of children: Not on file  . Years of education: Not on file  . Highest education level: Not on file   Occupational History  . Not on file  Tobacco Use  . Smoking status: Never Smoker  . Smokeless tobacco: Never Used  Substance and Sexual Activity  . Alcohol use: Yes  . Drug use: No  . Sexual activity: Not on file  Other Topics Concern  . Not on file  Social History Narrative  . Not on file   Social Determinants of Health   Financial Resource Strain: Not on file  Food Insecurity: Not on file  Transportation Needs: Not on file  Physical Activity: Not on file  Stress: Not on file  Social Connections: Not on file  Intimate Partner Violence: Not on file         Allergies  Allergen Reactions  . Hydrocodone   . Meloxicam   . Other Other (See Comments)  . Penicillins Nausea Only    And a rash  . Tramadol Hcl Other (See Comments)    REACTION: nausea and sweating  . Tramadol Hcl Other (See Comments)    REACTION: nausea and sweating  . Sulfa Antibiotics Nausea Only, Hives and Rash    And rash          Current Outpatient Medications  Medication Sig Dispense Refill  . cadexomer iodine (IODOSORB) 0.9 % gel Apply 1 application topically  daily as needed for wound care. 40 g 0  . calcium carbonate (TUMS - DOSED IN MG ELEMENTAL CALCIUM) 500 MG chewable tablet Chew 1 tablet by mouth as needed for indigestion or heartburn.    . Cholecalciferol 25 MCG (1000 UT) capsule Take by mouth.    . doxycycline (VIBRA-TABS) 100 MG tablet Take 1 tablet (100 mg total) by mouth 2 (two) times daily. 20 tablet 0  . ibuprofen (ADVIL) 200 MG tablet Take by mouth every 8 (eight) hours as needed.     . Multiple Vitamin (MULTIVITAMIN WITH MINERALS) TABS tablet Take 1 tablet by mouth daily.    . Red Yeast Rice Extract 600 MG CAPS Take by mouth.    . Turmeric 500 MG CAPS Take by mouth.     No current facility-administered medications for this visit.    REVIEW OF SYSTEMS:  [X]  denotes positive finding, [ ]  denotes negative finding Cardiac  Comments:  Chest pain or  chest pressure:    Shortness of breath upon exertion:    Short of breath when lying flat:    Irregular heart rhythm:        Vascular    Pain in calf, thigh, or hip brought on by ambulation: x   Pain in feet at night that wakes you up from your sleep:  x   Blood clot in your veins:    Leg swelling:         Pulmonary    Oxygen at home:    Productive cough:     Wheezing:         Neurologic    Sudden weakness in arms or legs:     Sudden numbness in arms or legs:     Sudden onset of difficulty speaking or slurred speech:    Temporary loss of vision in one eye:     Problems with dizziness:         Gastrointestinal    Blood in stool:     Vomited blood:         Genitourinary    Burning when urinating:     Blood in urine:        Psychiatric    Major depression:         Hematologic    Bleeding problems:    Problems with blood clotting too easily:        Skin    Rashes or ulcers:        Constitutional    Fever or chills:      PHYSICAL EXAM:    Vitals:   05/24/20 1455  BP: (!) 180/103  Pulse: 74  Resp: 14  Temp: 97.8 F (36.6 C)  TempSrc: Temporal  SpO2: 97%  Weight: 122 lb (55.3 kg)  Height: 4\' 11"  (1.499 m)    GENERAL: The patient is a well-nourished female, in no acute distress. The vital signs are documented above. CARDIAC: There is a regular rate and rhythm.  VASCULAR:  Palpable femoral pulses bilaterally No palpable pedal pulses Ulceration on the right metatarsal head as pictured below with some rubor in the foot PULMONARY: No respiratory distress. ABDOMEN: Soft and non-tender.  MUSCULOSKELETAL: There are no major deformities or cyanosis. NEUROLOGIC: No focal weakness or paresthesias are detected. PSYCHIATRIC: The patient has a normal affect.      DATA:   ABIs today are 0.57 on the right monophasic and 0.94 on the left  monophasic  Assessment/Plan:  87 year old female that presents with critical limb ischemia of  the right lower extremity with tissue loss.  She has this wound as pictured above on her right metatarsal head with severely depressed ABIs 0.57 and abnormal monophasic waveforms at the ankle.  I have recommended aortogram with right lower extremity arteriogram and possible intervention.  She is a very independent and functional.  We discussed evaluating for endovascular options.  We discussed sometimes there are long segment occlusions that require open surgery but in discussion with her and her daughter we all agreed we would like to avoid open surgery if possible.  Risk benefits discussed   Rebekah Shelling, MD [facility] Office: [phone]

## 2020-05-26 NOTE — Op Note (Signed)
Patient name: Rebekah Bridges MRN: [id number] DOB: 1916-01-10 Sex: female  05/26/2020 Pre-operative Diagnosis: Right foot nonhealing wound with severe PAD Post-operative diagnosis:  Same Surgeon:  Cephus Shelling, MD Procedure Performed: 1.  Ultrasound guided access of left common femoral artery 2.  Aortogram including catheter selection of aorta 3.  Bilateral lower extremity arteriogram with runoff 4.  Unsuccessful attempt at antegrade recanalization of right SFA near flush occlusion 5.  Mynx closure of the left common femoral artery 6.  65 minutes of monitored moderate conscious sedation time  Indications: Patient is 87 year old female that is very independent that was seen in the office on Tuesday for evaluation of a right great toe wound.  Ultimately she had depressed ABI 0.57 on the right monophasic.  She presents today for right leg arteriogram and possible intervention after risk benefits discussed.  Findings:   Aortogram showed no flow-limiting stenosis in the aortoiliac segment.  Bilateral renal arteries appeared patent.  Right lower extremity arteriogram showed a patent common femoral and profunda.  She had a near flush occlusion of the right SFA with only a very small stump that was visualized on very oblique imaging.  Ultimately she reconstituted a mid to distal SFA that was very diseased with a patent above and below-knee popliteal artery.  Proximal AT is occluded but it does reconstitute with distal flow down the AT.  Dominant runoff appears to be through the peroneal. The posterior tibial artery is occluded.  Left lower extremity arteriogram showed a patent common femora,l profunda, SFA, popliteal and at least two-vessel runoff via the anterior tibial artery and peroneal and all diffusely diseased.   Procedure:  The patient was identified in the holding area and taken to room 8.  The patient was then placed supine on the table and prepped and draped in the usual sterile  fashion.  A time out was called.  Ultrasound was used to evaluate the left common femoral artery.  It was patent .  A digital ultrasound image was acquired.  A micropuncture needle was used to access the left common femoral artery under ultrasound guidance.  An 018 wire was advanced without resistance and a micropuncture sheath was placed.  The 018 wire was removed and a benson wire was placed.  The micropuncture sheath was exchanged for a 5 french sheath.  An omniflush catheter was advanced over the wire to the level of L-1.  An abdominal angiogram was obtained.  Next the catheter was pulled down and bilateral lower extremity runoff was obtained.  Pertinent findings noted above.  I did attempt to see if I could cannulate the right SFA stump that we visualized on oblique imaging.  Used Glidewire advantage and exchanged for a long 5 [place] Ansell sheath in the left groin over the aortic bifurcation and down the right external iliac artery.  Patient was given 100 units per kg heparin IV.  I then used a Glidewire advantage with a glide cath to cannulate the right SFA stump.  I could get into the SFA but this was very diseased and the wire would not track after multiple manipulations.  I did try a V 18 for smaller system unsuccessful as well.  Ultimately I was in a subintimal plane and could not reenter the true lumen.  That point time I elected to stop.  Wires and catheters were removed.  I put a short 5 French sheath in the left groin and a mynx closure device was deployed.  Taken to  holding in stable condition.   Plan: Unsuccessful attempt at antegrade recanalization of near flush right SFA occlusion.  Ultimately would need a right femoral-popliteal bypass from my standpoint.  Given her age of 87 family has elected wound care.  Will follow wound in office and follow-up in one month.  Cephus Shelling, MD [facility] [place] Office: [phone]

## 2020-05-26 NOTE — Progress Notes (Signed)
Dr Clark into see pt.

## 2020-05-27 ENCOUNTER — Encounter (HOSPITAL_COMMUNITY): Payer: Self-pay | Admitting: Vascular Surgery

## 2020-06-01 ENCOUNTER — Ambulatory Visit (INDEPENDENT_AMBULATORY_CARE_PROVIDER_SITE_OTHER): Payer: Medicare Other | Admitting: Podiatry

## 2020-06-01 ENCOUNTER — Encounter (HOSPITAL_COMMUNITY): Payer: Self-pay | Admitting: Vascular Surgery

## 2020-06-01 ENCOUNTER — Other Ambulatory Visit: Payer: Self-pay

## 2020-06-01 DIAGNOSIS — G629 Polyneuropathy, unspecified: Secondary | ICD-10-CM | POA: Diagnosis not present

## 2020-06-01 DIAGNOSIS — I739 Peripheral vascular disease, unspecified: Secondary | ICD-10-CM | POA: Diagnosis not present

## 2020-06-01 DIAGNOSIS — R0989 Other specified symptoms and signs involving the circulatory and respiratory systems: Secondary | ICD-10-CM | POA: Diagnosis not present

## 2020-06-01 DIAGNOSIS — L97511 Non-pressure chronic ulcer of other part of right foot limited to breakdown of skin: Secondary | ICD-10-CM

## 2020-06-01 MED ORDER — IODOSORB 0.9 % EX GEL: 1.0000 "application " | Freq: Every day | CUTANEOUS | 0 refills | Status: DC | PRN

## 2020-06-01 MED ORDER — DOXYCYCLINE HYCLATE 100 MG PO TABS: 100.0000 mg | ORAL_TABLET | Freq: Two times a day (BID) | ORAL | 0 refills | Status: DC

## 2020-06-07 NOTE — Progress Notes (Signed)
  Subjective:  Patient ID: Rebekah Bridges, female    DOB: 1915/11/29,  MRN: [id number]  Chief Complaint  Patient presents with  . Foot Ulcer    Follow up ulcer right   "It doesn't look good"    87 y.o. female presents with the above complaint. History confirmed with patient.  She is referred to me by Dr. Chestine Spore.  She has had this wound but is continuing to worsen.  She recently underwent angiography with him but revascularization was not possible as they were not able to cross the lesions.  Objective:  Physical Exam: Nonpalpable pulses, her skin is warm, there is an ulcer measuring 0.6 cm x 0.3 cm x 0.2 cm right medial first MTPJ, exposed medial capsule, no cellulitis or purulence or malodor she has hallux valgus deformity with prominent bone.    Assessment:   1. Ulcer of right foot, limited to breakdown of skin (HCC)   2. Decreased pedal pulses   3. Peripheral polyneuropathy   4. PAD (peripheral artery disease) (HCC)      Plan:  Patient was evaluated and treated and all questions answered.  Reviewed further treatment options with the patient and her daughter.  They state that Dr. Chestine Spore is willing to consider her for bypass surgery and that this is something the patient is interested in.  She understands that this could be a significant procedure that could carry a lot of risk including death.  She is not amenable to amputation at this point.  For now I recommend we continue local wound care with Iodosorb gel, continue doxycycline which I renewed for her.  I will coordinate with Dr. Chestine Spore to see if vascular bypass is going to be a good option for her.  If so then following such a procedure we could perform debridement with excision of wound and bone beneath this to facilitate closure.  Return in about 2 weeks (around 06/15/2020) for wound care.

## 2020-06-15 ENCOUNTER — Ambulatory Visit (INDEPENDENT_AMBULATORY_CARE_PROVIDER_SITE_OTHER): Payer: Medicare Other | Admitting: Podiatry

## 2020-06-15 ENCOUNTER — Other Ambulatory Visit: Payer: Self-pay

## 2020-06-15 ENCOUNTER — Encounter: Payer: Self-pay | Admitting: Podiatry

## 2020-06-15 DIAGNOSIS — L97516 Non-pressure chronic ulcer of other part of right foot with bone involvement without evidence of necrosis: Secondary | ICD-10-CM | POA: Diagnosis not present

## 2020-06-15 DIAGNOSIS — M86671 Other chronic osteomyelitis, right ankle and foot: Secondary | ICD-10-CM

## 2020-06-15 DIAGNOSIS — I739 Peripheral vascular disease, unspecified: Secondary | ICD-10-CM | POA: Diagnosis not present

## 2020-06-15 NOTE — Patient Instructions (Signed)
Take the antibiotics 1 hour after eating. Do not lie down or lie in a recliner for 1 hour after taking it.

## 2020-06-15 NOTE — Progress Notes (Signed)
  Subjective:  Patient ID: Rebekah Bridges, female    DOB: 1915-03-04,  MRN: [id number]  Chief Complaint  Patient presents with  . Wound Check    Daughter states "I think it looks awful and bigger"   patient says she has had n/v since starting the Doxycycline    87 y.o. female presents with the above complaint. History confirmed with patient.  She and her daughter thinks that they think it may be worse.  She has had nausea since she started taking the doxycycline.  Objective:  Physical Exam: Nonpalpable pulses, her skin is warm, there is an ulcer measuring 0.8 cm x 0.5 cm x 0.3 cm right medial first MTPJ, exposed medial capsule, today with cellulitis but no purulence or malodor    Assessment:   1. 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 PAD (peripheral artery disease) (HCC)   3. Other chronic osteomyelitis of right foot (HCC)      Plan:  Patient was evaluated and treated and all questions answered.  Ulcer right foot -Debridement as below. -Dressed with Iodosorb, DSD. -Continue local wound care at home -I discussed with her that given the importance of her to continue the antibiotics, doxycycline is likely the least to cause nausea and vomiting her other options would be clindamycin which I think would be much worse for her.  She said she will try to take it. -I have also discussed her case with Dr. Chestine Spore and thinks likely bypass will be the treatment plan.  They will see him on May 10 and plan for surgery.  I am planning to do this likely has a tendon procedure such as Dr. Doylene Canard continues twice. -With worsening wound I recommend we get an MRI to evaluate for underlying infection and to help plan our surgical treatment for this for the foot    Procedure: Excisional Debridement of Wound Rationale: Removal of non-viable soft tissue from the wound to promote healing.  Anesthesia: none Post-Debridement Wound Measurements: 0.8 cm x 0.5 cm  x 0.3 cm  Type of Debridement: Sharp Excisional Tissue Removed: Non-viable soft tissue Depth of Debridement: subcutaneous tissue. Technique: Sharp excisional debridement to bleeding, viable wound base.  Dressing: Dry, sterile, compression dressing. Disposition: Patient tolerated procedure well. Patient to return in 1 week for follow-up.  Return in about 1 week (around 06/22/2020).

## 2020-06-16 ENCOUNTER — Telehealth: Payer: Self-pay

## 2020-06-16 NOTE — Telephone Encounter (Signed)
Patient's daughter called. The wound on her right foot is worsening - painful to the bone.They would like her seen asap to be evaluated and to discuss bypass surgery. Moved up patient appt.

## 2020-06-21 ENCOUNTER — Other Ambulatory Visit: Payer: Self-pay

## 2020-06-21 ENCOUNTER — Ambulatory Visit (INDEPENDENT_AMBULATORY_CARE_PROVIDER_SITE_OTHER): Payer: Medicare Other | Admitting: Vascular Surgery

## 2020-06-21 ENCOUNTER — Other Ambulatory Visit (HOSPITAL_COMMUNITY): Payer: Self-pay | Admitting: Vascular Surgery

## 2020-06-21 ENCOUNTER — Encounter: Payer: Self-pay | Admitting: Vascular Surgery

## 2020-06-21 ENCOUNTER — Ambulatory Visit (HOSPITAL_COMMUNITY)
Admission: RE | Admit: 2020-06-21 | Discharge: 2020-06-21 | Disposition: A | Discharge status: Home / Self Care | Payer: Medicare Other | Source: Ambulatory Visit | Attending: Vascular Surgery | Admitting: Vascular Surgery

## 2020-06-21 ENCOUNTER — Telehealth: Payer: Self-pay

## 2020-06-21 VITALS — BP 199/92 | HR 51 | Temp 97.1°F | Resp 14 | Ht 59.0 in | Wt 118.0 lb

## 2020-06-21 DIAGNOSIS — I739 Peripheral vascular disease, unspecified: Secondary | ICD-10-CM

## 2020-06-21 NOTE — Progress Notes (Signed)
Patient name: Rebekah Bridges MRN: [id number] DOB: 1915-05-29 Sex: female  REASON FOR CONSULT: Discuss right lower extremity bypass for worsening foot wound   HPI: Rebekah Bridges is a 87 y.o. female, with no significant medical history that presents to discuss right lower extremity bypass for worsening foot wound.  She was initially seen for nonhealing right foot wound and underwent arteriogram on 05/26/2020.  We were unsuccessful in antegrade recanalization of her right SFA near flush occlusion.  She been seen by podiatry and has had worsening tissue loss in the foot.  Initially family wanted to pursue palliative wound care but they have changed their mind and she asked to move up their appointment with me today.  She denies any cardiac history.  She denies any active chest pain or shortness of breath.  She states she can walk fairly long distances without interruption  Past Medical History:  Diagnosis Date  . Osteoporosis    s/p vertebroplasty for fracutres x 3    Past Surgical History:  Procedure Laterality Date  . ABDOMINAL AORTOGRAM W/LOWER EXTREMITY N/A 05/26/2020   Procedure: ABDOMINAL AORTOGRAM W/LOWER EXTREMITY;  Surgeon: Cephus Shelling, MD;  Location: [facility];  Service: Cardiovascular;  Laterality: N/A;  . BLADDER AUGMENTATION  1964  . SPINE SURGERY  2010  . SPINE SURGERY  2011  . SPINE SURGERY  2012    Family History  Problem Relation Age of Onset  . Cancer Father   . Learning disabilities Paternal Grandmother     SOCIAL HISTORY: Social History   Socioeconomic History  . Marital status: Widowed    Spouse name: Not on file  . Number of children: Not on file  . Years of education: Not on file  . Highest education level: Not on file  Occupational History  . Not on file  Tobacco Use  . Smoking status: Never Smoker  . Smokeless tobacco: Never Used  Substance and Sexual Activity  . Alcohol use: Yes  . Drug use: No  . Sexual activity: Not on file   Other Topics Concern  . Not on file  Social History Narrative  . Not on file   Social Determinants of Health   Financial Resource Strain: Not on file  Food Insecurity: Not on file  Transportation Needs: Not on file  Physical Activity: Not on file  Stress: Not on file  Social Connections: Not on file  Intimate Partner Violence: Not on file    Allergies  Allergen Reactions  . Hydrocodone   . Meloxicam   . Other Other (See Comments)  . Penicillins Nausea Only    And a rash  . Tramadol Hcl Other (See Comments)    REACTION: nausea and sweating  . Tramadol Hcl Other (See Comments)    REACTION: nausea and sweating  . Sulfa Antibiotics Nausea Only, Hives and Rash    And rash    Current Outpatient Medications  Medication Sig Dispense Refill  . BETA CAROTENE PO Take 1 tablet by mouth daily.    . cadexomer iodine (IODOSORB) 0.9 % gel Apply 1 application topically daily as needed for wound care. 40 g 0  . calcium carbonate (TUMS - DOSED IN MG ELEMENTAL CALCIUM) 500 MG chewable tablet Chew 1 tablet by mouth daily as needed for indigestion or heartburn.    . Cholecalciferol 25 MCG (1000 UT) capsule Take 1,000 Units by mouth daily.    Marland Kitchen doxycycline (VIBRA-TABS) 100 MG tablet Take 1 tablet (100 mg total)  by mouth 2 (two) times daily. 28 tablet 0  . ibuprofen (ADVIL) 200 MG tablet Take 200 mg by mouth every 8 (eight) hours as needed for fever or headache.    . Multiple Vitamin (MULTIVITAMIN WITH MINERALS) TABS tablet Take 1 tablet by mouth daily.    . RED YEAST RICE EXTRACT PO Take 1,200 mg by mouth daily.     No current facility-administered medications for this visit.    REVIEW OF SYSTEMS:  [X]  denotes positive finding, [ ]  denotes negative finding Cardiac  Comments:  Chest pain or chest pressure:    Shortness of breath upon exertion:    Short of breath when lying flat:    Irregular heart rhythm:        Vascular    Pain in calf, thigh, or hip brought on by ambulation: x    Pain in feet at night that wakes you up from your sleep:  x   Blood clot in your veins:    Leg swelling:         Pulmonary    Oxygen at home:    Productive cough:     Wheezing:         Neurologic    Sudden weakness in arms or legs:     Sudden numbness in arms or legs:     Sudden onset of difficulty speaking or slurred speech:    Temporary loss of vision in one eye:     Problems with dizziness:         Gastrointestinal    Blood in stool:     Vomited blood:         Genitourinary    Burning when urinating:     Blood in urine:        Psychiatric    Major depression:         Hematologic    Bleeding problems:    Problems with blood clotting too easily:        Skin    Rashes or ulcers:        Constitutional    Fever or chills:      PHYSICAL EXAM: Vitals:   06/21/20 1030 06/21/20 1034  BP: (!) 193/94 (!) 199/92  Pulse: (!) 51 (!) 51  Resp: 14   Temp: (!) 97.1 F (36.2 C)   TempSrc: Temporal   SpO2: 96%   Weight: 118 lb (53.5 kg)   Height: 4\' 11"  (1.499 m)     GENERAL: The patient is a well-nourished female, in no acute distress. The vital signs are documented above. CARDIAC: There is a regular rate and rhythm.  VASCULAR:  Palpable femoral pulses bilaterally No palpable pedal pulses Ulceration on the right metatarsal head - wound larger PULMONARY: No respiratory distress. ABDOMEN: Soft and non-tender.  MUSCULOSKELETAL: There are no major deformities or cyanosis. NEUROLOGIC: No focal weakness or paresthesias are detected. PSYCHIATRIC: The patient has a normal affect.          DATA:   ABIs previously were 0.57 on the right monophasic and 0.94 on the left monophasic.  Right great toe pressure depressed at 40.  Assessment/Plan:  87 year old female presents with critical limb ischemia of the right lower extremity.  Previously performed right lower extremity arteriogram with attempted antegrade recanalization of near flush SFA occlusion on 05/26/2020  that was unsuccessful.  She was initially planning to pursue palliative wound care but has changed her mind after the wound has worsened.  Her toe pressure is 40 and  I doubt she can heal her wound without revascularization.  Plan right common femoral to likely above knee popliteal artery bypass.  Discussed likely increased risk given her age of 68 but she is very functional at baseline and has no significant past medical history.  She feels that she is willing to take this risk given if she ends up losing her foot that it will be her ultimate demise.  I will reach out to podiatry to let them know she is scheduled for Monday May 9 since she requested earliest date possible.   Cephus Shelling, MD [facility] [place] Office: [phone]

## 2020-06-21 NOTE — Telephone Encounter (Signed)
Opened in error

## 2020-06-22 ENCOUNTER — Ambulatory Visit (INDEPENDENT_AMBULATORY_CARE_PROVIDER_SITE_OTHER): Payer: Medicare Other | Admitting: Podiatry

## 2020-06-22 DIAGNOSIS — M86671 Other chronic osteomyelitis, right ankle and foot: Secondary | ICD-10-CM

## 2020-06-22 DIAGNOSIS — L97516 Non-pressure chronic ulcer of other part of right foot with bone involvement without evidence of necrosis: Secondary | ICD-10-CM | POA: Diagnosis not present

## 2020-06-22 DIAGNOSIS — I739 Peripheral vascular disease, unspecified: Secondary | ICD-10-CM

## 2020-06-24 ENCOUNTER — Other Ambulatory Visit
Admission: RE | Admit: 2020-06-24 | Discharge: 2020-06-24 | Disposition: A | Discharge status: Home / Self Care | Payer: Medicare Other | Source: Ambulatory Visit | Attending: Vascular Surgery | Admitting: Vascular Surgery

## 2020-06-24 ENCOUNTER — Encounter (HOSPITAL_COMMUNITY): Payer: Self-pay | Admitting: Vascular Surgery

## 2020-06-24 ENCOUNTER — Other Ambulatory Visit: Payer: Self-pay

## 2020-06-24 ENCOUNTER — Ambulatory Visit
Admission: RE | Admit: 2020-06-24 | Discharge: 2020-06-24 | Disposition: A | Discharge status: Home / Self Care | Payer: Medicare Other | Source: Ambulatory Visit | Attending: Podiatry | Admitting: Podiatry

## 2020-06-24 DIAGNOSIS — Z01812 Encounter for preprocedural laboratory examination: Secondary | ICD-10-CM | POA: Diagnosis present

## 2020-06-24 DIAGNOSIS — Z20822 Contact with and (suspected) exposure to covid-19: Secondary | ICD-10-CM | POA: Diagnosis not present

## 2020-06-24 DIAGNOSIS — M86671 Other chronic osteomyelitis, right ankle and foot: Secondary | ICD-10-CM

## 2020-06-24 LAB — SARS CORONAVIRUS 2 (TAT 6-24 HRS): SARS Coronavirus 2: NEGATIVE

## 2020-06-24 NOTE — Progress Notes (Signed)
PCP - [facility]  Chest x-ray - denies EKG - 07/17/19 Stress Test - denies ECHO - 07/17/19 Cardiac Cath - denies  COVID TEST- 5/6 pending   Anesthesia review:  Patients daughter is concerned about her blood pressure running high   Patient denies shortness of breath, fever, cough and chest pain at PAT appointment   All instructions explained to the patient, with a verbal understanding of the material. Patient agrees to go over the instructions while at home for a better understanding. Patient also instructed to self quarantine after being tested for COVID-19. The opportunity to ask questions was provided.

## 2020-06-26 NOTE — Anesthesia Preprocedure Evaluation (Addendum)
Anesthesia Evaluation  Patient identified by MRN, date of birth, ID band Patient awake    Reviewed: Allergy & Precautions, NPO status , Patient's Chart, lab work & pertinent test results  Airway Mallampati: II  TM Distance: >3 FB Neck ROM: Full    Dental  (+) Dental Advisory Given, Edentulous Upper, Poor Dentition   Pulmonary neg pulmonary ROS,    Pulmonary exam normal breath sounds clear to auscultation       Cardiovascular hypertension, + Peripheral Vascular Disease  Normal cardiovascular exam+ dysrhythmias Atrial Fibrillation  Rhythm:Regular Rate:Normal  Echo 07/17/19: 1. Left ventricular ejection fraction, by estimation, is 60 to 65%. The left ventricle has normal function. The left ventricle has no regional wall motion abnormalities. Left ventricular diastolic parameters are consistent with Grade I diastolic dysfunction (impaired relaxation).  2. Right ventricular systolic function is normal. The right ventricular size is normal. There is normal pulmonary artery systolic pressure.  3. Rhythm is normal sinus.    Neuro/Psych s/p vertebroplasty for fracutres x 3  Neuromuscular disease    GI/Hepatic Neg liver ROS, GERD  Medicated,  Endo/Other  negative endocrine ROS  Renal/GU negative Renal ROS     Musculoskeletal  (+) Arthritis ,   Abdominal   Peds  Hematology negative hematology ROS (+)   Anesthesia Other Findings   Reproductive/Obstetrics                            Anesthesia Physical Anesthesia Plan  ASA: IV  Anesthesia Plan: General   Post-op Pain Management:    Induction: Intravenous  PONV Risk Score and Plan: 3 and Dexamethasone, Ondansetron and Propofol infusion  Airway Management Planned: Oral ETT  Additional Equipment: Arterial line  Intra-op Plan:   Post-operative Plan: Extubation in OR  Informed Consent: I have reviewed the patients History and Physical, chart,  labs and discussed the procedure including the risks, benefits and alternatives for the proposed anesthesia with the patient or authorized representative who has indicated his/her understanding and acceptance.     Dental advisory given  Plan Discussed with: CRNA  Anesthesia Plan Comments:        Anesthesia Quick Evaluation

## 2020-06-27 ENCOUNTER — Encounter (HOSPITAL_COMMUNITY)
Admission: RE | Disposition: A | Discharge status: Home Health | Payer: Self-pay | Source: Home / Self Care | Attending: Vascular Surgery

## 2020-06-27 ENCOUNTER — Other Ambulatory Visit: Payer: Self-pay

## 2020-06-27 ENCOUNTER — Inpatient Hospital Stay (HOSPITAL_COMMUNITY): Payer: Medicare Other | Admitting: Anesthesiology

## 2020-06-27 ENCOUNTER — Encounter: Payer: Self-pay | Admitting: Podiatry

## 2020-06-27 ENCOUNTER — Inpatient Hospital Stay (HOSPITAL_COMMUNITY)
Admission: RE | Admit: 2020-06-27 | Discharge: 2020-06-30 | DRG: 240 | Disposition: A | Discharge status: Home Health | Payer: Medicare Other | Attending: Vascular Surgery | Admitting: Vascular Surgery

## 2020-06-27 ENCOUNTER — Inpatient Hospital Stay (HOSPITAL_COMMUNITY): Payer: Medicare Other

## 2020-06-27 ENCOUNTER — Encounter (HOSPITAL_COMMUNITY): Payer: Self-pay | Admitting: Vascular Surgery

## 2020-06-27 DIAGNOSIS — M1990 Unspecified osteoarthritis, unspecified site: Secondary | ICD-10-CM | POA: Diagnosis present

## 2020-06-27 DIAGNOSIS — G629 Polyneuropathy, unspecified: Secondary | ICD-10-CM

## 2020-06-27 DIAGNOSIS — I480 Paroxysmal atrial fibrillation: Secondary | ICD-10-CM | POA: Diagnosis present

## 2020-06-27 DIAGNOSIS — Z20822 Contact with and (suspected) exposure to covid-19: Secondary | ICD-10-CM | POA: Diagnosis present

## 2020-06-27 DIAGNOSIS — I493 Ventricular premature depolarization: Secondary | ICD-10-CM | POA: Diagnosis present

## 2020-06-27 DIAGNOSIS — I4891 Unspecified atrial fibrillation: Secondary | ICD-10-CM | POA: Diagnosis not present

## 2020-06-27 DIAGNOSIS — I70235 Atherosclerosis of native arteries of right leg with ulceration of other part of foot: Secondary | ICD-10-CM | POA: Diagnosis present

## 2020-06-27 DIAGNOSIS — I97191 Other postprocedural cardiac functional disturbances following other surgery: Secondary | ICD-10-CM | POA: Diagnosis not present

## 2020-06-27 DIAGNOSIS — Z885 Allergy status to narcotic agent status: Secondary | ICD-10-CM | POA: Diagnosis not present

## 2020-06-27 DIAGNOSIS — M86471 Chronic osteomyelitis with draining sinus, right ankle and foot: Secondary | ICD-10-CM

## 2020-06-27 DIAGNOSIS — H9190 Unspecified hearing loss, unspecified ear: Secondary | ICD-10-CM | POA: Diagnosis present

## 2020-06-27 DIAGNOSIS — Z882 Allergy status to sulfonamides status: Secondary | ICD-10-CM | POA: Diagnosis not present

## 2020-06-27 DIAGNOSIS — Y838 Other surgical procedures as the cause of abnormal reaction of the patient, or of later complication, without mention of misadventure at the time of the procedure: Secondary | ICD-10-CM | POA: Diagnosis not present

## 2020-06-27 DIAGNOSIS — I491 Atrial premature depolarization: Secondary | ICD-10-CM | POA: Diagnosis not present

## 2020-06-27 DIAGNOSIS — I10 Essential (primary) hypertension: Secondary | ICD-10-CM | POA: Diagnosis present

## 2020-06-27 DIAGNOSIS — M2011 Hallux valgus (acquired), right foot: Secondary | ICD-10-CM

## 2020-06-27 DIAGNOSIS — L97514 Non-pressure chronic ulcer of other part of right foot with necrosis of bone: Secondary | ICD-10-CM | POA: Diagnosis present

## 2020-06-27 DIAGNOSIS — M810 Age-related osteoporosis without current pathological fracture: Secondary | ICD-10-CM | POA: Diagnosis present

## 2020-06-27 DIAGNOSIS — M21611 Bunion of right foot: Secondary | ICD-10-CM | POA: Diagnosis present

## 2020-06-27 DIAGNOSIS — I739 Peripheral vascular disease, unspecified: Secondary | ICD-10-CM | POA: Diagnosis present

## 2020-06-27 DIAGNOSIS — Z888 Allergy status to other drugs, medicaments and biological substances status: Secondary | ICD-10-CM

## 2020-06-27 DIAGNOSIS — I998 Other disorder of circulatory system: Secondary | ICD-10-CM

## 2020-06-27 DIAGNOSIS — Z9889 Other specified postprocedural states: Principal | ICD-10-CM

## 2020-06-27 DIAGNOSIS — M86671 Other chronic osteomyelitis, right ankle and foot: Secondary | ICD-10-CM | POA: Diagnosis not present

## 2020-06-27 DIAGNOSIS — I70221 Atherosclerosis of native arteries of extremities with rest pain, right leg: Secondary | ICD-10-CM | POA: Diagnosis present

## 2020-06-27 DIAGNOSIS — D696 Thrombocytopenia, unspecified: Secondary | ICD-10-CM | POA: Diagnosis present

## 2020-06-27 DIAGNOSIS — Z880 Allergy status to penicillin: Secondary | ICD-10-CM | POA: Diagnosis not present

## 2020-06-27 HISTORY — PX: FEMORAL-POPLITEAL BYPASS GRAFT: SHX937

## 2020-06-27 HISTORY — PX: WOUND DEBRIDEMENT: SHX247

## 2020-06-27 LAB — URINALYSIS, ROUTINE W REFLEX MICROSCOPIC
Bacteria, UA: NONE SEEN
Bilirubin Urine: NEGATIVE
Glucose, UA: NEGATIVE mg/dL
Hgb urine dipstick: NEGATIVE
Ketones, ur: NEGATIVE mg/dL
Nitrite: NEGATIVE
Protein, ur: NEGATIVE mg/dL
Specific Gravity, Urine: 1.013 (ref 1.005–1.030)
pH: 7 (ref 5.0–8.0)

## 2020-06-27 LAB — COMPREHENSIVE METABOLIC PANEL
ALT: 9 U/L (ref 0–44)
AST: 23 U/L (ref 15–41)
Albumin: 3.4 g/dL — ABNORMAL LOW (ref 3.5–5.0)
Alkaline Phosphatase: 87 U/L (ref 38–126)
Anion gap: 6 (ref 5–15)
BUN: 16 mg/dL (ref 8–23)
CO2: 28 mmol/L (ref 22–32)
Calcium: 8.8 mg/dL — ABNORMAL LOW (ref 8.9–10.3)
Chloride: 100 mmol/L (ref 98–111)
Creatinine, Ser: 0.79 mg/dL (ref 0.44–1.00)
GFR, Estimated: >60 mL/min (ref >60)
Glucose, Bld: 111 mg/dL — ABNORMAL HIGH (ref 70–99)
Potassium: 3.7 mmol/L (ref 3.5–5.1)
Sodium: 134 mmol/L — ABNORMAL LOW (ref 135–145)
Total Bilirubin: 0.6 mg/dL (ref 0.3–1.2)
Total Protein: 7.1 g/dL (ref 6.5–8.1)

## 2020-06-27 LAB — CBC
HCT: 42.2 % (ref 36.0–46.0)
Hemoglobin: 13.1 g/dL (ref 12.0–15.0)
MCH: 30.8 pg (ref 26.0–34.0)
MCHC: 31 g/dL (ref 30.0–36.0)
MCV: 99.3 fL (ref 80.0–100.0)
Platelets: 129 10*3/uL — ABNORMAL LOW (ref 150–400)
RBC: 4.25 MIL/uL (ref 3.87–5.11)
RDW: 13.8 % (ref 11.5–15.5)
WBC: 2.3 10*3/uL — ABNORMAL LOW (ref 4.0–10.5)
nRBC: 0 % (ref 0.0–0.2)

## 2020-06-27 LAB — PROTIME-INR
INR: 1.1 (ref 0.8–1.2)
Prothrombin Time: 13.9 seconds (ref 11.4–15.2)

## 2020-06-27 LAB — POCT ACTIVATED CLOTTING TIME: Activated Clotting Time: 315 seconds

## 2020-06-27 LAB — SURGICAL PCR SCREEN
MRSA, PCR: NEGATIVE
Staphylococcus aureus: NEGATIVE

## 2020-06-27 LAB — TYPE AND SCREEN
ABO/RH(D): O POS
Antibody Screen: NEGATIVE

## 2020-06-27 LAB — ABO/RH: ABO/RH(D): O POS

## 2020-06-27 LAB — APTT: aPTT: 28 seconds (ref 24–36)

## 2020-06-27 SURGERY — BYPASS GRAFT FEMORAL-POPLITEAL ARTERY
Anesthesia: General | Site: Leg Upper | Laterality: Right

## 2020-06-27 MED ORDER — FENTANYL CITRATE (PF) 100 MCG/2ML IJ SOLN: 25.0000 ug | INTRAMUSCULAR | Status: DC | PRN

## 2020-06-27 MED ORDER — HEPARIN SODIUM (PORCINE) 5000 UNIT/ML IJ SOLN
5000.0000 [IU] | Freq: Three times a day (TID) | INTRAMUSCULAR | Status: DC
  Administered 2020-06-28 – 2020-06-30 (×7): 5000 [IU] via SUBCUTANEOUS
  Filled 2020-06-27 (×7): qty 1

## 2020-06-27 MED ORDER — CHLORHEXIDINE GLUCONATE CLOTH 2 % EX PADS
6.0000 | MEDICATED_PAD | Freq: Every day | CUTANEOUS | Status: DC
  Administered 2020-06-27: 6 via TOPICAL

## 2020-06-27 MED ORDER — POLYETHYLENE GLYCOL 3350 17 G PO PACK
17.0000 g | PACK | Freq: Every day | ORAL | Status: DC | PRN
  Administered 2020-06-29: 17 g via ORAL
  Filled 2020-06-27: qty 1

## 2020-06-27 MED ORDER — VANCOMYCIN HCL IN DEXTROSE 1-5 GM/200ML-% IV SOLN
1000.0000 mg | INTRAVENOUS | Status: AC
  Administered 2020-06-27: 1000 mg via INTRAVENOUS
  Filled 2020-06-27: qty 200

## 2020-06-27 MED ORDER — SODIUM CHLORIDE 0.9 % IV SOLN
INTRAVENOUS | Status: AC
  Filled 2020-06-27: qty 1.2

## 2020-06-27 MED ORDER — FENTANYL CITRATE (PF) 250 MCG/5ML IJ SOLN
INTRAMUSCULAR | Status: AC
  Filled 2020-06-27: qty 5

## 2020-06-27 MED ORDER — ONDANSETRON HCL 4 MG/2ML IJ SOLN
4.0000 mg | Freq: Four times a day (QID) | INTRAMUSCULAR | Status: DC | PRN
  Administered 2020-06-27 – 2020-06-28 (×2): 4 mg via INTRAVENOUS
  Filled 2020-06-27 (×2): qty 2

## 2020-06-27 MED ORDER — PHENYLEPHRINE 40 MCG/ML (10ML) SYRINGE FOR IV PUSH (FOR BLOOD PRESSURE SUPPORT)
PREFILLED_SYRINGE | INTRAVENOUS | Status: DC | PRN
  Administered 2020-06-27: 80 ug via INTRAVENOUS
  Administered 2020-06-27 (×2): 40 ug via INTRAVENOUS

## 2020-06-27 MED ORDER — ALUM & MAG HYDROXIDE-SIMETH 200-200-20 MG/5ML PO SUSP: 15.0000 mL | ORAL | Status: DC | PRN

## 2020-06-27 MED ORDER — POTASSIUM CHLORIDE CRYS ER 20 MEQ PO TBCR: 20.0000 meq | EXTENDED_RELEASE_TABLET | Freq: Every day | ORAL | Status: DC | PRN

## 2020-06-27 MED ORDER — FENTANYL CITRATE (PF) 250 MCG/5ML IJ SOLN
INTRAMUSCULAR | Status: DC | PRN
  Administered 2020-06-27 (×2): 50 ug via INTRAVENOUS
  Administered 2020-06-27: 25 ug via INTRAVENOUS
  Administered 2020-06-27: 50 ug via INTRAVENOUS

## 2020-06-27 MED ORDER — PAPAVERINE HCL 30 MG/ML IJ SOLN
INTRAMUSCULAR | Status: AC
  Filled 2020-06-27: qty 2

## 2020-06-27 MED ORDER — GUAIFENESIN-DM 100-10 MG/5ML PO SYRP: 15.0000 mL | ORAL_SOLUTION | ORAL | Status: DC | PRN

## 2020-06-27 MED ORDER — LABETALOL HCL 5 MG/ML IV SOLN
10.0000 mg | INTRAVENOUS | Status: DC | PRN
Start: 2020-06-27 — End: 2020-06-30

## 2020-06-27 MED ORDER — LACTATED RINGERS IV SOLN: INTRAVENOUS | Status: DC

## 2020-06-27 MED ORDER — VANCOMYCIN HCL 500 MG/100ML IV SOLN
500.0000 mg | INTRAVENOUS | Status: AC
  Administered 2020-06-28 – 2020-06-29 (×2): 500 mg via INTRAVENOUS
  Filled 2020-06-27 (×2): qty 100

## 2020-06-27 MED ORDER — BUPIVACAINE HCL (PF) 0.25 % IJ SOLN
INTRAMUSCULAR | Status: AC
  Filled 2020-06-27: qty 30

## 2020-06-27 MED ORDER — ACETAMINOPHEN 325 MG PO TABS
325.0000 mg | ORAL_TABLET | ORAL | Status: DC | PRN
  Administered 2020-06-29 (×2): 650 mg via ORAL
  Filled 2020-06-27 (×2): qty 2

## 2020-06-27 MED ORDER — ACETAMINOPHEN 500 MG PO TABS
1000.0000 mg | ORAL_TABLET | Freq: Once | ORAL | Status: AC
  Administered 2020-06-27: 1000 mg via ORAL
  Filled 2020-06-27: qty 2

## 2020-06-27 MED ORDER — PHENYLEPHRINE HCL-NACL 10-0.9 MG/250ML-% IV SOLN
INTRAVENOUS | Status: DC | PRN
  Administered 2020-06-27: 25 ug/min via INTRAVENOUS

## 2020-06-27 MED ORDER — PROPOFOL 10 MG/ML IV BOLUS
INTRAVENOUS | Status: AC
  Filled 2020-06-27: qty 40

## 2020-06-27 MED ORDER — ROCURONIUM BROMIDE 10 MG/ML (PF) SYRINGE
PREFILLED_SYRINGE | INTRAVENOUS | Status: DC | PRN
  Administered 2020-06-27: 10 mg via INTRAVENOUS
  Administered 2020-06-27: 20 mg via INTRAVENOUS
  Administered 2020-06-27: 70 mg via INTRAVENOUS

## 2020-06-27 MED ORDER — METOPROLOL TARTRATE 5 MG/5ML IV SOLN: 2.0000 mg | INTRAVENOUS | Status: DC | PRN

## 2020-06-27 MED ORDER — SODIUM CHLORIDE 0.9 % IV SOLN: INTRAVENOUS | Status: DC

## 2020-06-27 MED ORDER — PROPOFOL 10 MG/ML IV BOLUS
INTRAVENOUS | Status: DC | PRN
  Administered 2020-06-27: 90 mg via INTRAVENOUS

## 2020-06-27 MED ORDER — ONDANSETRON HCL 4 MG/2ML IJ SOLN: 4.0000 mg | Freq: Once | INTRAMUSCULAR | Status: DC | PRN

## 2020-06-27 MED ORDER — HEPARIN SODIUM (PORCINE) 1000 UNIT/ML IJ SOLN
INTRAMUSCULAR | Status: AC
  Filled 2020-06-27: qty 1

## 2020-06-27 MED ORDER — SUGAMMADEX SODIUM 200 MG/2ML IV SOLN
INTRAVENOUS | Status: DC | PRN
  Administered 2020-06-27 (×2): 100 mg via INTRAVENOUS

## 2020-06-27 MED ORDER — CHLORHEXIDINE GLUCONATE CLOTH 2 % EX PADS: 6.0000 | MEDICATED_PAD | Freq: Once | CUTANEOUS | Status: DC

## 2020-06-27 MED ORDER — OXYCODONE-ACETAMINOPHEN 5-325 MG PO TABS
1.0000 | ORAL_TABLET | Freq: Four times a day (QID) | ORAL | Status: DC | PRN
  Administered 2020-06-27 (×2): 1 via ORAL
  Administered 2020-06-28: 2 via ORAL
  Filled 2020-06-27: qty 2
  Filled 2020-06-27 (×2): qty 1

## 2020-06-27 MED ORDER — PROPOFOL 10 MG/ML IV BOLUS
INTRAVENOUS | Status: AC
  Filled 2020-06-27: qty 20

## 2020-06-27 MED ORDER — CALCIUM CARBONATE ANTACID 500 MG PO CHEW: 1.0000 | CHEWABLE_TABLET | Freq: Three times a day (TID) | ORAL | Status: DC | PRN

## 2020-06-27 MED ORDER — VITAMIN D 25 MCG (1000 UNIT) PO TABS
1000.0000 [IU] | ORAL_TABLET | Freq: Every day | ORAL | Status: DC
  Administered 2020-06-27 – 2020-06-29 (×3): 1000 [IU] via ORAL
  Filled 2020-06-27 (×4): qty 1

## 2020-06-27 MED ORDER — BISACODYL 10 MG RE SUPP: 10.0000 mg | Freq: Every day | RECTAL | Status: DC | PRN

## 2020-06-27 MED ORDER — LIDOCAINE 2% (20 MG/ML) 5 ML SYRINGE
INTRAMUSCULAR | Status: DC | PRN
  Administered 2020-06-27: 60 mg via INTRAVENOUS

## 2020-06-27 MED ORDER — PROPOFOL 500 MG/50ML IV EMUL
INTRAVENOUS | Status: DC | PRN
  Administered 2020-06-27: 50 ug/kg/min via INTRAVENOUS

## 2020-06-27 MED ORDER — PHENOL 1.4 % MT LIQD: 1.0000 | OROMUCOSAL | Status: DC | PRN

## 2020-06-27 MED ORDER — VANCOMYCIN HCL 1000 MG/200ML IV SOLN: 1000.0000 mg | Freq: Two times a day (BID) | INTRAVENOUS | Status: DC

## 2020-06-27 MED ORDER — ORAL CARE MOUTH RINSE: 15.0000 mL | Freq: Once | OROMUCOSAL | Status: AC

## 2020-06-27 MED ORDER — CHLORHEXIDINE GLUCONATE 0.12 % MT SOLN
15.0000 mL | Freq: Once | OROMUCOSAL | Status: AC
  Administered 2020-06-27: 15 mL via OROMUCOSAL
  Filled 2020-06-27: qty 15

## 2020-06-27 MED ORDER — SODIUM CHLORIDE 0.9 % IV SOLN: 500.0000 mL | Freq: Once | INTRAVENOUS | Status: DC | PRN

## 2020-06-27 MED ORDER — MORPHINE SULFATE (PF) 2 MG/ML IV SOLN
1.0000 mg | INTRAVENOUS | Status: DC | PRN
Start: 2020-06-27 — End: 2020-06-30

## 2020-06-27 MED ORDER — 0.9 % SODIUM CHLORIDE (POUR BTL) OPTIME
TOPICAL | Status: DC | PRN
  Administered 2020-06-27: 2000 mL

## 2020-06-27 MED ORDER — ONDANSETRON HCL 4 MG/2ML IJ SOLN
INTRAMUSCULAR | Status: DC | PRN
  Administered 2020-06-27: 4 mg via INTRAVENOUS

## 2020-06-27 MED ORDER — MAGNESIUM SULFATE 2 GM/50ML IV SOLN
2.0000 g | Freq: Every day | INTRAVENOUS | Status: DC | PRN
Start: 2020-06-27 — End: 2020-06-30

## 2020-06-27 MED ORDER — ALBUMIN HUMAN 5 % IV SOLN: INTRAVENOUS | Status: DC | PRN

## 2020-06-27 MED ORDER — ADULT MULTIVITAMIN W/MINERALS CH
1.0000 | ORAL_TABLET | Freq: Every day | ORAL | Status: DC
  Administered 2020-06-27 – 2020-06-29 (×3): 1 via ORAL
  Filled 2020-06-27 (×3): qty 1

## 2020-06-27 MED ORDER — PANTOPRAZOLE SODIUM 40 MG PO TBEC
40.0000 mg | DELAYED_RELEASE_TABLET | Freq: Every day | ORAL | Status: DC
  Administered 2020-06-27 – 2020-06-30 (×4): 40 mg via ORAL
  Filled 2020-06-27 (×4): qty 1

## 2020-06-27 MED ORDER — DEXAMETHASONE SODIUM PHOSPHATE 10 MG/ML IJ SOLN
INTRAMUSCULAR | Status: DC | PRN
  Administered 2020-06-27: 10 mg via INTRAVENOUS

## 2020-06-27 MED ORDER — SODIUM CHLORIDE 0.9 % IV SOLN
INTRAVENOUS | Status: DC | PRN
  Administered 2020-06-27: 500 mL

## 2020-06-27 MED ORDER — ACETAMINOPHEN 650 MG RE SUPP: 325.0000 mg | RECTAL | Status: DC | PRN

## 2020-06-27 MED ORDER — DOCUSATE SODIUM 100 MG PO CAPS
100.0000 mg | ORAL_CAPSULE | Freq: Every day | ORAL | Status: DC
  Administered 2020-06-28 – 2020-06-30 (×3): 100 mg via ORAL
  Filled 2020-06-27 (×3): qty 1

## 2020-06-27 MED ORDER — HYDRALAZINE HCL 20 MG/ML IJ SOLN: 5.0000 mg | INTRAMUSCULAR | Status: DC | PRN

## 2020-06-27 MED ORDER — PROTAMINE SULFATE 10 MG/ML IV SOLN
INTRAVENOUS | Status: DC | PRN
  Administered 2020-06-27: 30 mg via INTRAVENOUS
  Administered 2020-06-27: 10 mg via INTRAVENOUS

## 2020-06-27 MED ORDER — ASPIRIN EC 81 MG PO TBEC
81.0000 mg | DELAYED_RELEASE_TABLET | Freq: Every day | ORAL | Status: DC
Start: 2020-06-27 — End: 2020-06-30
  Administered 2020-06-27 – 2020-06-30 (×4): 81 mg via ORAL
  Filled 2020-06-27 (×4): qty 1

## 2020-06-27 MED ORDER — LACTATED RINGERS IV SOLN: INTRAVENOUS | Status: DC | PRN

## 2020-06-27 MED ORDER — BUPIVACAINE HCL 0.25 % IJ SOLN
INTRAMUSCULAR | Status: DC | PRN
  Administered 2020-06-27: 10 mL

## 2020-06-27 MED ORDER — HEPARIN SODIUM (PORCINE) 1000 UNIT/ML IJ SOLN
INTRAMUSCULAR | Status: DC | PRN
  Administered 2020-06-27: 6000 [IU] via INTRAVENOUS

## 2020-06-27 SURGICAL SUPPLY — 82 items
ADH SKN CLS APL DERMABOND .7 (GAUZE/BANDAGES/DRESSINGS) ×6
AGENT HMST SPONGE THK3/8 (HEMOSTASIS)
BANDAGE ESMARK 6X9 LF (GAUZE/BANDAGES/DRESSINGS) IMPLANT
BLADE CLIPPER SURG (BLADE) IMPLANT
BLADE LONG MED 31MMX9MM (MISCELLANEOUS) ×1
BLADE LONG MED 31X9 (MISCELLANEOUS) ×3 IMPLANT
BNDG CMPR 9X6 STRL LF SNTH (GAUZE/BANDAGES/DRESSINGS)
BNDG ESMARK 6X9 LF (GAUZE/BANDAGES/DRESSINGS)
BNDG GAUZE ELAST 4 BULKY (GAUZE/BANDAGES/DRESSINGS) ×4 IMPLANT
BONE CEMENT GENTAMICIN (Cement) ×4 IMPLANT
BOWL SMART MIX CTS (DISPOSABLE) ×4 IMPLANT
CANISTER SUCT 3000ML PPV (MISCELLANEOUS) ×4 IMPLANT
CANNULA VESSEL 3MM 2 BLNT TIP (CANNULA) ×4 IMPLANT
CEMENT BONE GENTAMICIN 40 (Cement) ×2 IMPLANT
CLIP FOGARTY SPRING 6M (CLIP) IMPLANT
CLIP VESOCCLUDE MED 24/CT (CLIP) ×4 IMPLANT
CLIP VESOCCLUDE SM WIDE 24/CT (CLIP) ×4 IMPLANT
CONT SPEC 4OZ CLIKSEAL STRL BL (MISCELLANEOUS) ×12 IMPLANT
COVER PROBE W GEL 5X96 (DRAPES) ×4 IMPLANT
COVER SURGICAL LIGHT HANDLE (MISCELLANEOUS) ×4 IMPLANT
COVER WAND RF STERILE (DRAPES) IMPLANT
CUFF TOURN SGL QUICK 24 (TOURNIQUET CUFF)
CUFF TOURN SGL QUICK 34 (TOURNIQUET CUFF)
CUFF TOURN SGL QUICK 42 (TOURNIQUET CUFF) IMPLANT
CUFF TRNQT CYL 24X4X16.5-23 (TOURNIQUET CUFF) IMPLANT
CUFF TRNQT CYL 34X4.125X (TOURNIQUET CUFF) IMPLANT
DERMABOND ADVANCED (GAUZE/BANDAGES/DRESSINGS) ×6
DERMABOND ADVANCED .7 DNX12 (GAUZE/BANDAGES/DRESSINGS) ×6 IMPLANT
DRAIN CHANNEL 15F RND FF W/TCR (WOUND CARE) IMPLANT
DRAPE C-ARM 42X72 X-RAY (DRAPES) IMPLANT
DRAPE HALF SHEET 40X57 (DRAPES) ×4 IMPLANT
DRSG XEROFORM 1X8 (GAUZE/BANDAGES/DRESSINGS) ×4 IMPLANT
ELECT REM PT RETURN 9FT ADLT (ELECTROSURGICAL) ×4
ELECTRODE REM PT RTRN 9FT ADLT (ELECTROSURGICAL) ×2 IMPLANT
EVACUATOR SILICONE 100CC (DRAIN) IMPLANT
GAUZE SPONGE 4X4 12PLY STRL LF (GAUZE/BANDAGES/DRESSINGS) ×4 IMPLANT
GAUZE XEROFORM 1X8 LF (GAUZE/BANDAGES/DRESSINGS) ×4 IMPLANT
GLOVE BIO SURGEON STRL SZ7.5 (GLOVE) ×12 IMPLANT
GLOVE SRG 8 PF TXTR STRL LF DI (GLOVE) ×2 IMPLANT
GLOVE SURG MICRO LTX SZ6.5 (GLOVE) ×16 IMPLANT
GLOVE SURG UNDER POLY LF SZ6.5 (GLOVE) ×16 IMPLANT
GLOVE SURG UNDER POLY LF SZ8 (GLOVE) ×4
GOWN STRL REUS W/ TWL LRG LVL3 (GOWN DISPOSABLE) ×4 IMPLANT
GOWN STRL REUS W/ TWL XL LVL3 (GOWN DISPOSABLE) ×6 IMPLANT
GOWN STRL REUS W/TWL LRG LVL3 (GOWN DISPOSABLE) ×8
GOWN STRL REUS W/TWL XL LVL3 (GOWN DISPOSABLE) ×12
HANDPIECE INTERPULSE COAX TIP (DISPOSABLE) ×4
HEMOSTAT SPONGE AVITENE ULTRA (HEMOSTASIS) IMPLANT
INSERT FOGARTY SM (MISCELLANEOUS) IMPLANT
KIT BASIN OR (CUSTOM PROCEDURE TRAY) ×4 IMPLANT
KIT TURNOVER KIT B (KITS) ×4 IMPLANT
MARKER PEN SURG W/LABELS BLK (STERILIZATION PRODUCTS) ×4 IMPLANT
NS IRRIG 1000ML POUR BTL (IV SOLUTION) ×8 IMPLANT
PACK PERIPHERAL VASCULAR (CUSTOM PROCEDURE TRAY) ×4 IMPLANT
PAD ARMBOARD 7.5X6 YLW CONV (MISCELLANEOUS) ×8 IMPLANT
SET HNDPC FAN SPRY TIP SCT (DISPOSABLE) ×2 IMPLANT
SET MICROPUNCTURE 5F STIFF (MISCELLANEOUS) IMPLANT
SLEEVE SURGEON STRL (DRAPES) ×4 IMPLANT
STOPCOCK 4 WAY LG BORE MALE ST (IV SETS) IMPLANT
SUT ETHILON 3 0 PS 1 (SUTURE) ×4 IMPLANT
SUT GORETEX 5 0 TT13 24 (SUTURE) IMPLANT
SUT GORETEX 6.0 TT13 (SUTURE) IMPLANT
SUT MNCRL AB 3-0 PS2 18 (SUTURE) ×4 IMPLANT
SUT MNCRL AB 4-0 PS2 18 (SUTURE) ×12 IMPLANT
SUT PROLENE 5 0 C 1 24 (SUTURE) ×12 IMPLANT
SUT PROLENE 6 0 BV (SUTURE) ×20 IMPLANT
SUT PROLENE 7 0 BV 1 (SUTURE) IMPLANT
SUT SILK 2 0 PERMA HAND 18 BK (SUTURE) IMPLANT
SUT SILK 2 0 SH (SUTURE) ×4 IMPLANT
SUT SILK 3 0 (SUTURE) ×4
SUT SILK 3-0 18XBRD TIE 12 (SUTURE) ×2 IMPLANT
SUT VIC AB 2-0 CT1 27 (SUTURE) ×8
SUT VIC AB 2-0 CT1 TAPERPNT 27 (SUTURE) ×4 IMPLANT
SUT VIC AB 3-0 SH 27 (SUTURE) ×12
SUT VIC AB 3-0 SH 27X BRD (SUTURE) ×6 IMPLANT
TAPE UMBILICAL COTTON 1/8X30 (MISCELLANEOUS) IMPLANT
TOWEL GREEN STERILE (TOWEL DISPOSABLE) ×4 IMPLANT
TRAY FOLEY MTR SLVR 14FR STAT (SET/KITS/TRAYS/PACK) ×4 IMPLANT
TRAY FOLEY MTR SLVR 16FR STAT (SET/KITS/TRAYS/PACK) IMPLANT
TUBING EXTENTION W/L.L. (IV SETS) IMPLANT
UNDERPAD 30X36 HEAVY ABSORB (UNDERPADS AND DIAPERS) ×4 IMPLANT
WATER STERILE IRR 1000ML POUR (IV SOLUTION) ×4 IMPLANT

## 2020-06-27 NOTE — H&P (Signed)
History and Physical Interval Note:  06/27/2020 7:35 AM  Rebekah Bridges  has presented today for surgery, with the diagnosis of PAD.  The various methods of treatment have been discussed with the patient and family. After consideration of risks, benefits and other options for treatment, the patient has consented to  Procedure(s): RIGHT FEMORAL TO POPLITEAL ARTERY BYPASS GRAFTING (Right) DEBRIDEMENT AND EXCISION OF WOUND AND BONE RIGHT FOOT (Right) as a surgical intervention.  The patient's history has been reviewed, patient examined, no change in status, stable for surgery.  I have reviewed the patient's chart and labs.  Questions were answered to the patient's satisfaction.    Right fem pop for CLI with tissue loss.  Cephus Shelling  Patient name: Rebekah Bridges         MRN: [id number]        DOB: Jun 29, 1915          Sex: female  REASON FOR CONSULT: Discuss right lower extremity bypass for worsening foot wound   HPI: Rebekah Bridges is a 87 y.o. female, with no significant medical history that presents to discuss right lower extremity bypass for worsening foot wound.  She was initially seen for nonhealing right foot wound and underwent arteriogram on 05/26/2020.  We were unsuccessful in antegrade recanalization of her right SFA near flush occlusion.  She been seen by podiatry and has had worsening tissue loss in the foot.  Initially family wanted to pursue palliative wound care but they have changed their mind and she asked to move up their appointment with me today.  She denies any cardiac history.  She denies any active chest pain or shortness of breath.  She states she can walk fairly long distances without interruption      Past Medical History:  Diagnosis Date  . Osteoporosis    s/p vertebroplasty for fracutres x 3         Past Surgical History:  Procedure Laterality Date  . ABDOMINAL AORTOGRAM W/LOWER EXTREMITY N/A 05/26/2020   Procedure: ABDOMINAL AORTOGRAM W/LOWER  EXTREMITY;  Surgeon: Cephus Shelling, MD;  Location: [facility];  Service: Cardiovascular;  Laterality: N/A;  . BLADDER AUGMENTATION  1964  . SPINE SURGERY  2010  . SPINE SURGERY  2011  . SPINE SURGERY  2012         Family History  Problem Relation Age of Onset  . Cancer Father   . Learning disabilities Paternal Grandmother     SOCIAL HISTORY: Social History        Socioeconomic History  . Marital status: Widowed    Spouse name: Not on file  . Number of children: Not on file  . Years of education: Not on file  . Highest education level: Not on file  Occupational History  . Not on file  Tobacco Use  . Smoking status: Never Smoker  . Smokeless tobacco: Never Used  Substance and Sexual Activity  . Alcohol use: Yes  . Drug use: No  . Sexual activity: Not on file  Other Topics Concern  . Not on file  Social History Narrative  . Not on file   Social Determinants of Health   Financial Resource Strain: Not on file  Food Insecurity: Not on file  Transportation Needs: Not on file  Physical Activity: Not on file  Stress: Not on file  Social Connections: Not on file  Intimate Partner Violence: Not on file         Allergies  Allergen Reactions  . Hydrocodone   . Meloxicam   . Other Other (See Comments)  . Penicillins Nausea Only    And a rash  . Tramadol Hcl Other (See Comments)    REACTION: nausea and sweating  . Tramadol Hcl Other (See Comments)    REACTION: nausea and sweating  . Sulfa Antibiotics Nausea Only, Hives and Rash    And rash          Current Outpatient Medications  Medication Sig Dispense Refill  . BETA CAROTENE PO Take 1 tablet by mouth daily.    . cadexomer iodine (IODOSORB) 0.9 % gel Apply 1 application topically daily as needed for wound care. 40 g 0  . calcium carbonate (TUMS - DOSED IN MG ELEMENTAL CALCIUM) 500 MG chewable tablet Chew 1 tablet by mouth daily as needed for indigestion or  heartburn.    . Cholecalciferol 25 MCG (1000 UT) capsule Take 1,000 Units by mouth daily.    Marland Kitchen doxycycline (VIBRA-TABS) 100 MG tablet Take 1 tablet (100 mg total) by mouth 2 (two) times daily. 28 tablet 0  . ibuprofen (ADVIL) 200 MG tablet Take 200 mg by mouth every 8 (eight) hours as needed for fever or headache.    . Multiple Vitamin (MULTIVITAMIN WITH MINERALS) TABS tablet Take 1 tablet by mouth daily.    . RED YEAST RICE EXTRACT PO Take 1,200 mg by mouth daily.     No current facility-administered medications for this visit.    REVIEW OF SYSTEMS:  [X]  denotes positive finding, [ ]  denotes negative finding Cardiac  Comments:  Chest pain or chest pressure:    Shortness of breath upon exertion:    Short of breath when lying flat:    Irregular heart rhythm:        Vascular    Pain in calf, thigh, or hip brought on by ambulation: x   Pain in feet at night that wakes you up from your sleep:  x   Blood clot in your veins:    Leg swelling:         Pulmonary    Oxygen at home:    Productive cough:     Wheezing:         Neurologic    Sudden weakness in arms or legs:     Sudden numbness in arms or legs:     Sudden onset of difficulty speaking or slurred speech:    Temporary loss of vision in one eye:     Problems with dizziness:         Gastrointestinal    Blood in stool:     Vomited blood:         Genitourinary    Burning when urinating:     Blood in urine:        Psychiatric    Major depression:         Hematologic    Bleeding problems:    Problems with blood clotting too easily:        Skin    Rashes or ulcers:        Constitutional    Fever or chills:      PHYSICAL EXAM:     Vitals:   06/21/20 1030 06/21/20 1034  BP: (!) 193/94 (!) 199/92  Pulse: (!) 51 (!) 51  Resp: 14   Temp: (!) 97.1 F (36.2 C)   TempSrc: Temporal   SpO2: 96%   Weight:  118 lb (53.5 kg)   Height:  4\' 11"  (1.499 m)     GENERAL: The patient is a well-nourished female, in no acute distress. The vital signs are documented above. CARDIAC: There is a regular rate and rhythm.  VASCULAR:  Palpable femoral pulses bilaterally No palpable pedal pulses Ulceration on the right metatarsal head - wound larger PULMONARY: No respiratory distress. ABDOMEN: Soft and non-tender.  MUSCULOSKELETAL: There are no major deformities or cyanosis. NEUROLOGIC: No focal weakness or paresthesias are detected. PSYCHIATRIC: The patient has a normal affect.          DATA:   ABIs previously were 0.57 on the right monophasic and 0.94 on the left monophasic.  Right great toe pressure depressed at 40.  Assessment/Plan:  87 year old female presents with critical limb ischemia of the right lower extremity.  Previously performed right lower extremity arteriogram with attempted antegrade recanalization of near flush SFA occlusion on 05/26/2020 that was unsuccessful.  She was initially planning to pursue palliative wound care but has changed her mind after the wound has worsened.  Her toe pressure is 40 and I doubt she can heal her wound without revascularization.  Plan right common femoral to likely above knee popliteal artery bypass.  Discussed likely increased risk given her age of 65 but she is very functional at baseline and has no significant past medical history.  She feels that she is willing to take this risk given if she ends up losing her foot that it will be her ultimate demise.  I will reach out to podiatry to let them know she is scheduled for Monday May 9 since she requested earliest date possible.   May 11, MD [facility] Office: [phone]

## 2020-06-27 NOTE — Brief Op Note (Addendum)
06/27/2020  12:12 PM  PATIENT:  Rebekah Bridges  87 y.o. female  PRE-OPERATIVE DIAGNOSIS: Ulcer of right foot and osteomyelitis  POST-OPERATIVE DIAGNOSIS:  Ulcer of right foot and osteomyelitis  PROCEDURE:  Procedure(s):  RIGHT FOOT PARTIAL RESECTION FIRST METATARSAL/ PHALANGEAL JOINT, IMPLANT NON-BIOLOGICAL DRUG IMPLANT (Right)  SURGEON:  Surgeon(s) and Role: Panel 1:    Cephus Shelling, MD - Primary    * Leonie Douglas, MD - Assisting Panel 2:    * Raylinn Kosar, Rachelle Hora, DPM - Primary  ASSISTANTS: none   ANESTHESIA:   general  EBL:  100 mL   BLOOD ADMINISTERED:none  DRAINS: none   LOCAL MEDICATIONS USED:  BUPIVICAINE 10 cc  SPECIMEN: First metatarsal head and base of proximal phalanx with tibial sesamoid  DISPOSITION OF SPECIMEN:  Pathology taken in separate specimens tissue; culture of first metatarsal head taken for microbiology  COUNTS:  YES  TOURNIQUET:  * Missing tourniquet times found for documented tourniquets in log: [id number] *  DICTATION: .Note written in EPIC  PLAN OF CARE: Admit to inpatient   PATIENT DISPOSITION:  PACU - hemodynamically stable.   Delay start of Pharmacological VTE agent (>24hrs) due to surgical blood loss or risk of bleeding: no

## 2020-06-27 NOTE — Progress Notes (Addendum)
I spoke with the patient's daughter and discussed the results of the MRI and the surgical plan for today.  Plan for resection of infected first metatarsal head, tibial sesamoid and base of proximal phalanx, possible interposition of antibiotic impregnated cement spacer permanently.  We will plan to close wound and excise this as well.  All questions were addressed.  Informed consent has previously been signed and reviewed.  Discussed with Dr Chestine Spore intra op, bypass with native vein successful with good signal to foot, will proceed as planned with resection, plan to send specimens and cultures for antibiotic guidance.  Sharl Ma, DPM 06/27/2020

## 2020-06-27 NOTE — Discharge Instructions (Signed)
 [facility]  Discharge instructions  Lower Extremity Bypass Surgery  Please refer to the following instruction for your post-procedure care. Your surgeon or physician assistant will discuss any changes with you.  Activity  You are encouraged to walk as much as you can. You can slowly return to normal activities during the month after your surgery. Avoid strenuous activity and heavy lifting until your doctor tells you it's OK. Avoid activities such as vacuuming or swinging a golf club. Do not drive until your doctor give the OK and you are no longer taking prescription pain medications. It is also normal to have difficulty with sleep habits, eating and bowel movement after surgery. These will go away with time.  Bathing/Showering  Shower daily after you go home. Do not soak in a bathtub, hot tub, or swim until the incision heals completely.  Incision Care  Clean your incision with mild soap and water. Shower every day. Pat the area dry with a clean towel. You do not need a bandage unless otherwise instructed. Do not apply any ointments or creams to your incision. If you have open wounds you will be instructed how to care for them or a visiting nurse may be arranged for you. If you have staples or sutures along your incision they will be removed at your post-op appointment. You may have skin glue on your incision. Do not peel it off. It will come off on its own in about one week.  Wash the groin wound with soap and water daily and pat dry. (No tub bath-only shower)  Then put a dry gauze or washcloth in the groin to keep this area dry to help prevent wound infection.  Do this daily and as needed.  Do not use Vaseline or neosporin on your incisions.  Only use soap and water on your incisions and then protect and keep dry.  Diet  Resume your normal diet. There are no special food restrictions following this procedure. A low fat/ low cholesterol diet is  recommended for all patients with vascular disease. In order to heal from your surgery, it is CRITICAL to get adequate nutrition. Your body requires vitamins, minerals, and protein. Vegetables are the best source of vitamins and minerals. Vegetables also provide the perfect balance of protein. Processed food has little nutritional value, so try to avoid this.  Medications  Resume taking all your medications unless your doctor or physician assistant tells you not to. If your incision is causing pain, you may take over-the-counter pain relievers such as acetaminophen (Tylenol). If you were prescribed a stronger pain medication, please aware these medication can cause nausea and constipation. Prevent nausea by taking the medication with a snack or meal. Avoid constipation by drinking plenty of fluids and eating foods with high amount of fiber, such as fruits, vegetables, and grains. Take Colace 100 mg (an over-the-counter stool softener) twice a day as needed for constipation.  Do not take Tylenol if you are taking prescription pain medications.  Follow Up  Our office will schedule a follow up appointment 2-3 weeks following discharge.  Please call us immediately for any of the following conditions  Severe or worsening pain in your legs or feet while at rest or while walking Increase pain, redness, warmth, or drainage (pus) from your incision site(s) Fever of 101 degree or higher The swelling in your leg with the bypass suddenly worsens and becomes more painful than when you were in the hospital If you have   been instructed to feel your graft pulse then you should do so every day. If you can no longer feel this pulse, call the office immediately. Not all patients are given this instruction.  Leg swelling is common after leg bypass surgery.  The swelling should improve over a few months following surgery. To improve the swelling, you may elevate your legs above the level of your heart while you are  sitting or resting. Your surgeon or physician assistant may ask you to apply an ACE wrap or wear compression (TED) stockings to help to reduce swelling.  Reduce your risk of vascular disease  Stop smoking. If you would like help call [facility] at 1-800-QUIT-NOW (1-800-784-8669) or  at 336-586-4000.  Manage your cholesterol Maintain a desired weight Control your diabetes weight Control your diabetes Keep your blood pressure down  If you have any questions, please call the office at [phone]  

## 2020-06-27 NOTE — Evaluation (Signed)
Physical Therapy Evaluation Patient Details Name: Rebekah Bridges MRN: [id number] DOB: [birth year] Today's Date: 06/27/2020   History of Present Illness  87 yo female s/p R fempop artery bypass graft and resection of 1st met head with tibial sesamoid, partial resection of proximal phalanx, and drug implant 5/9. PMH includes osteoporosis.  Clinical Impression   Pt presents with generalized weakness, mild R post-op pain, impaired standing balance, impaired gait, and decreased activity tolerance vs baseline. Pt to benefit from acute PT to address deficits. Pt ambulated very short room distance with use of RW and min assist to steady/manage lines, PT terminated further activity secondary to irregular tachycardia with highest read 137 bpm (averaging 90s-120 bpm). RN notified of this. PT expects pt to progress well, recommending family support at d/c for pt safety and to decrease fall risk post-operatively. PT to progress mobility as tolerated, and will continue to follow acutely.       Follow Up Recommendations Home health PT;Supervision/Assistance - 24 hour    Equipment Recommendations  None recommended by PT    Recommendations for Other Services       Precautions / Restrictions Precautions Precautions: Fall Precaution Comments: a-line RUE, post-op shoe Restrictions Weight Bearing Restrictions: No RLE Weight Bearing: Weight bearing as tolerated Other Position/Activity Restrictions: PT encouraged heel WB to offweight forefoot, as tolerated      Mobility  Bed Mobility Overal bed mobility: Needs Assistance Bed Mobility: Supine to Sit;Sit to Supine     Supine to sit: Supervision Sit to supine: Min assist   General bed mobility comments: supervision for supine>sit for safety, light assist for return to supine for LE lifting into bed and lines/leads.    Transfers Overall transfer level: Needs assistance Equipment used: Rolling walker (2 wheeled) Transfers: Sit to/from Stand Sit  to Stand: Min assist         General transfer comment: min assist for steadying upon standing, increased time to rise. Pt using LLE to stand and shifted weight to RLE once standing.  Ambulation/Gait Ambulation/Gait assistance: Min assist Gait Distance (Feet): 8 Feet Assistive device: Rolling walker (2 wheeled) Gait Pattern/deviations: Step-through pattern;Decreased stride length;Trunk flexed;Drifts right/left Gait velocity: decr   General Gait Details: min assist to steady, guide pt trajectory. Pt with HR reading in afib rhythm with HR 83-120 bpm, x1 brief period to 137 but immediately dropped to 120s. RN notified, gait terminated at this point.  Stairs            Wheelchair Mobility    Modified Rankin (Stroke Patients Only)       Balance Overall balance assessment: Needs assistance;History of Falls Sitting-balance support: No upper extremity supported;Feet supported Sitting balance-Leahy Scale: Good     Standing balance support: Bilateral upper extremity supported;During functional activity Standing balance-Leahy Scale: Poor Standing balance comment: reliant on external assist                             Pertinent Vitals/Pain Pain Assessment: Faces Faces Pain Scale: Hurts a little bit Pain Location: R foot Pain Descriptors / Indicators: Discomfort;Sore Pain Intervention(s): Limited activity within patient's tolerance;Monitored during session;Repositioned    Home Living Family/patient expects to be discharged to:: Private residence Living Arrangements: Alone Available Help at Discharge: Family;Available PRN/intermittently;Friend(s) Type of Home: Independent living facility Home Access: Level entry     Home Layout: One level Home Equipment: Walker - 4 wheels;Cane - single point;Grab bars - tub/shower  Prior Function Level of Independence: Needs assistance   Gait / Transfers Assistance Needed: pt using rollator and cane in home, states she  had one fall in the past 6 months while outside requiring stitches to scalp  ADL's / Homemaking Assistance Needed: Pt reports needing assist with bathing, daughter-in-law comes 1x/week to assist with this. Pt has a housekeeper for cleaning        Hand Dominance   Dominant Hand: Right    Extremity/Trunk Assessment   Upper Extremity Assessment Upper Extremity Assessment: Defer to OT evaluation    Lower Extremity Assessment Lower Extremity Assessment: Generalized weakness;RLE deficits/detail RLE Deficits / Details: s/p met head resection; able to perform toe flex/ext, LAQ, SLR    Cervical / Trunk Assessment Cervical / Trunk Assessment: Kyphotic  Communication   Communication: HOH  Cognition Arousal/Alertness: Awake/alert Behavior During Therapy: WFL for tasks assessed/performed Overall Cognitive Status: Within Functional Limits for tasks assessed                                        General Comments General comments (skin integrity, edema, etc.): Bleeding noted on pt's gauze dressing RLE, per daughter-in-law this is how it has been since surgery, RN notified to check. Pt's daughter in law stating pt's MD said pt would get up tomorrow, order reflects start today and RN in agreement PT eval appropriate POD0.     Exercises     Assessment/Plan    PT Assessment Patient needs continued PT services  PT Problem List Decreased strength;Decreased mobility;Decreased activity tolerance;Decreased balance;Decreased knowledge of use of DME;Pain;Decreased knowledge of precautions       PT Treatment Interventions DME instruction;Therapeutic activities;Gait training;Therapeutic exercise;Patient/family education;Functional mobility training;Balance training;Neuromuscular re-education    PT Goals (Current goals can be found in the Care Plan section)  Acute Rehab PT Goals Patient Stated Goal: go home, get back to independence PT Goal Formulation: With patient Time For  Goal Achievement: 07/11/20 Potential to Achieve Goals: Good    Frequency Min 4X/week   Barriers to discharge        Co-evaluation               AM-PAC PT "6 Clicks" Mobility  Outcome Measure Help needed turning from your back to your side while in a flat bed without using bedrails?: A Little Help needed moving from lying on your back to sitting on the side of a flat bed without using bedrails?: A Little Help needed moving to and from a bed to a chair (including a wheelchair)?: A Little Help needed standing up from a chair using your arms (e.g., wheelchair or bedside chair)?: A Little Help needed to walk in hospital room?: A Little Help needed climbing 3-5 steps with a railing? : A Little 6 Click Score: 18    End of Session Equipment Utilized During Treatment: Other (comment) (R post-op shoe) Activity Tolerance: Patient tolerated treatment well;Other (comment) (stopped session due to tachycardia) Patient left: in bed;with call bell/phone within reach;with bed alarm set;with family/visitor present Nurse Communication: Mobility status;Other (comment) (HR response to activity, possible need for dressing change) PT Visit Diagnosis: Difficulty in walking, not elsewhere classified (R26.2);Other abnormalities of gait and mobility (R26.89)    Time: 1620-1650 PT Time Calculation (min) (ACUTE ONLY): 30 min   Charges:   PT Evaluation $PT Eval Low Complexity: 1 Low PT Treatments $Therapeutic Activity: 8-22 mins  Stacie Glaze, PT DPT Acute Rehabilitation Services Pager [phone]  Office [phone]   Rebekah Bridges 06/27/2020, 5:33 PM

## 2020-06-27 NOTE — Transfer of Care (Signed)
Immediate Anesthesia Transfer of Care Note  Patient: Rebekah Bridges  Procedure(s) Performed: RIGHT COOMMON FEMORAL TO ABOVE KNEE POPLITEAL ARTERY BYPASS GRAFT USING RIGHT GREATER SAPHENOUS VEIN (Right Leg Upper) RIGHT FOOT PARTIAL RESECTION FIRST METATARSAL/ PHALANGEAL JOINT, IMPLANT NON-BIOLOGICAL DRUG IMPLANT (Right Foot)  Patient Location: PACU  Anesthesia Type:General  Level of Consciousness: drowsy and responds to stimulation  Airway & Oxygen Therapy: Patient Spontanous Breathing and Patient connected to face mask  Post-op Assessment: Report given to RN and Post -op Vital signs reviewed and stable  Post vital signs: Reviewed and stable  Last Vitals:  Vitals Value Taken Time  BP    Temp    Pulse    Resp 26 06/27/20 1225  SpO2    Vitals shown include unvalidated device data.  Last Pain:  Vitals:   06/27/20 0644  TempSrc: Oral  PainSc:          Complications: No complications documented.

## 2020-06-27 NOTE — Anesthesia Procedure Notes (Signed)
Procedure Name: Intubation Date/Time: 06/27/2020 7:55 AM Performed by: Trinna Post., CRNA Pre-anesthesia Checklist: Patient identified, Emergency Drugs available, Suction available and Patient being monitored Patient Re-evaluated:Patient Re-evaluated prior to induction Oxygen Delivery Method: Circle system utilized Preoxygenation: Pre-oxygenation with 100% oxygen Induction Type: IV induction Ventilation: Mask ventilation without difficulty and Oral airway inserted - appropriate to patient size Laryngoscope Size: Mac and 3 Grade View: Grade I Tube type: Oral Tube size: 7.0 mm Number of attempts: 1 Airway Equipment and Method: Stylet and Oral airway Placement Confirmation: ETT inserted through vocal cords under direct vision,  positive ETCO2 and breath sounds checked- equal and bilateral Secured at: 21 cm Tube secured with: Tape Dental Injury: Teeth and Oropharynx as per pre-operative assessment

## 2020-06-27 NOTE — Progress Notes (Signed)
  Subjective:  Patient ID: Rebekah Bridges, female    DOB: 1915-04-26,  MRN: [id number]  Chief Complaint  Patient presents with  . Foot Ulcer    Right foot, 1 week follow up    87 y.o. female returns with the above complaint. History confirmed with patient.  Saw Dr. Chestine Spore and they are scheduled for Jul 04, 2022 for bypass and tandem debridement.  MRI scheduled for 3 PM Friday.  Thinks is improved some  Objective:  Physical Exam: Nonpalpable pulses, her skin is warm, there is an ulcer measuring 0.8 cm x 0.5 cm x 0.3 cm right medial first MTPJ, exposed medial capsule, today with cellulitis but no purulence or malodor    Assessment:   No diagnosis found.   Plan:  Patient was evaluated and treated and all questions answered.  Ulcer right foot -No debridement today -Continue doxycycline -Continue Iodosorb local wound care until surgery -Discussed surgical intervention concomitant with her bypass.  Discussed the risk, benefits and potential complications including risk of further infection, amputation and death.  Surgery scheduled for Jul 04, 2022 morning.  Informed consent was signed and reviewed  No follow-ups on file.

## 2020-06-27 NOTE — Op Note (Signed)
Patient Name: Rebekah Bridges DOB: [birth year]  MRN: [id number]   Date of Service: 06/27/2020  Surgeon: Dr. 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 #2 osteomyelitis first metatarsal #3 hallux valgus deformity with bunion Procedures:  1) resection of first metatarsal head with tibial sesamoid  2) partial resection of proximal phalanx   3) insertion of nonbiodegradable drug delivery device Pathology/Specimens: ID Type Source Tests Collected by Time Destination  1 :    right first metatarsal            -evaluation resection margin Tissue PATH Bone resection SURGICAL PATHOLOGY Cephus Shelling, MD 06/27/2020 1127   2 :      right proximal phalanx         - evaluation resection of margin Tissue PATH Bone resection SURGICAL PATHOLOGY Cephus Shelling, MD 06/27/2020 1132   A : right foot Tissue Soft Tissue, Other AEROBIC/ANAEROBIC CULTURE W GRAM STAIN (SURGICAL/DEEP WOUND) Cephus Shelling, MD 06/27/2020 1124    Anesthesia: General Hemostasis: No tourniquet was used Estimated Blood Loss: 100 mL Materials:  Implant Name Type Inv. Item Serial No. Manufacturer Lot No. LRB No. Used Action  CEMENT BONE GENTAMICIN - [id number] Cement CEMENT BONE GENTAMICIN  [employer] [id number] Right 1 Implanted   Medications: 10 cc 0.25 bupivacaine plain Complications: None  Indications for Procedure:  This is a 87 y.o. female with a history of peripheral arterial disease and a nonhealing chronic ulcer of the right foot over a large bunion deformity with hallux valgus.  Preoperative MRI revealed osteomyelitis.  I recommended surgical resection following bypass by Dr. Chestine Spore from vascular surgery.   Procedure in Detail: The patient and the operative extremity were identified  previously by Dr. Chestine Spore in the operating room.  Informed consent for my portion of procedure have been signed and reviewed by the patient and her family.  I joined the procedure intraoperatively following his bypass which was successful.  A timeout for safety confirming the laterality procedure and site of surgery was undertaken prior to intervention.  10 cc of 0.25% bupivacaine plain was then injected in a Mayo block around the first ray.  Attention was then directed to the right medial first metatarsal where the chronic open ulceration with exposed capsule and bone was identified and excised in a longitudinal semielliptical manner.  This excision was done full-thickness down to the level of bone.  The underlying metatarsal head was quite soft and dilated.  I carried dissection proximally until 2 cm of the distal metatarsal was exposed and the soft tissues were elevated.  I resected the first metatarsal head as well as the tibial sesamoid in order to straighten the toe and remove the large bunion and osteomyelitic bone.  This was sent as a single specimen for pathology and a small piece of bone was taken as a tissue culture from this.  The base of the proximal phalanx was then exposed with sharp and blunt dissection and resected as well and sent as a separate specimen.  I then irrigated the wound thoroughly with 1 L sterile saline with pulse irrigation.  Polymethylmethacrylate bone cement spacer with impregnated gentamicin was then created to take position of the resected joint.  I placed this into the wound bed once hemostasis was achieved.  layered closure was then performed using 3-0  Monocryl which I also used to imbricate the flexor brevis tendon and muscle, subcutaneous tissue and then skin was closed with 3-0 nylon.  She tolerated procedure well had excellent perfusion throughout the procedure.  The foot was then dressed with Xeroform, 4 x 4's, Kerlix and tape. Patient tolerated the procedure well.    Disposition: Following a period of post-operative monitoring, patient will be transferred to the floor under Dr. Chestine Spore service.

## 2020-06-27 NOTE — Anesthesia Postprocedure Evaluation (Signed)
Anesthesia Post Note  Patient: Rebekah Bridges  Procedure(s) Performed: RIGHT COOMMON FEMORAL TO ABOVE KNEE POPLITEAL ARTERY BYPASS GRAFT USING RIGHT GREATER SAPHENOUS VEIN (Right Leg Upper) RIGHT FOOT PARTIAL RESECTION FIRST METATARSAL/ PHALANGEAL JOINT, IMPLANT NON-BIOLOGICAL DRUG IMPLANT (Right Foot)     Patient location during evaluation: PACU Anesthesia Type: General Level of consciousness: awake and alert, oriented and awake Pain management: pain level controlled Vital Signs Assessment: post-procedure vital signs reviewed and stable Respiratory status: spontaneous breathing, nonlabored ventilation, respiratory function stable and patient connected to nasal cannula oxygen Cardiovascular status: blood pressure returned to baseline and stable Postop Assessment: no apparent nausea or vomiting Anesthetic complications: no   No complications documented.  Last Vitals:  Vitals:   06/27/20 1405 06/27/20 1500  BP: (!) 189/91 (!) 169/93  Pulse: 66 86  Resp: 15 (!) 23  Temp: 37 C   SpO2: 97% 97%    Last Pain:  Vitals:   06/27/20 1557  TempSrc:   PainSc: Asleep                 Cecile Hearing

## 2020-06-27 NOTE — Op Note (Signed)
Date: Jun 27, 2020  Preoperative diagnosis: Critical limb ischemia of the right lower extremity with tissue loss  Postoperative diagnosis: Same  Procedure: 1.  Harvest of right lower extremity great saphenous vein 2.  Right common femoral to above-knee popliteal artery bypass with ipsilateral non-reversed great saphenous vein  Surgeon: Dr. Cephus Shelling, MD  Assistant: Dr. Elna Breslow, MD and Doreatha Massed, PA  Indications: Patient is 87 year old female who was seen with tissue loss in the right lower extremity.  She underwent angiogram after referral from podiatry and she had a near flush occlusion of her right SFA.  We attempted endovascular intervention that was unsuccessful.  Patient and family initially elected palliative wound care but she has had progressive worsening of the wound and after further discussion with podiatry she has elected to undergo bypass.  Sge presents after risk benefits discussed.  An assistant was needed for exposure and to expedite the case.  Findings: Right great saphenous vein was harvested from the saphenofemoral junction down to just above the knee and was of moderate caliber.  This was initially sewn to the common femoral artery end to side in non-reversed fashion and all the valves were lysed with good pulsatile flow distally.  I then tunneled vein graft subfascial subsartorial to the above-knee popliteal artery where end-to-side anastomosis was performed.  She had triphasic signals at the ankle in the peroneal artery at completion.  Anesthesia: General  EBL: Less than 75 mL  Details: Patient was taken to the operating room after informed consent was obtained.  She was placed on the operative table in the supine position and general endotracheal anesthesia was induced.  Subsequently used ultrasound and we marked saphenous vein in the right leg from the saphenofemoral junction to just above the knee where it appeared to be usable caliber.  That point in  time the right groin and right leg were then prepped and draped in standard sterile fashion.  We did perform preop timeout to identify patient, procedure and site.  She did get antibiotics.  Initially made a horizontal groin incision above her right inguinal crease.  Dissected down with Bovie cautery open the subcutaneous tissue and then the femoral sheath was opened longitudinally.  Dissected out the common femoral as well as SFA profunda and the distal external iliac artery with Bovie cautery and this was all controlled with Vesseloops.  The anterior wall the artery appeared soft for sewing an anastomosis.  That point time I then made a skip incision on the right medial thigh where the vein was marked while my partner Dr. Lenell Antu made a longitudinal incision just above the knee medially over the above-knee popliteal artery.  I dissected out the saphenous vein through the skip incision with Bovie cautery and all side branches were ligated between 3-0 silk ties and divided and I dissected this all the way up to her groin incision where we had exposed the common femoral and we dissected out the saphenofemoral junction and again all branches were controlled with 3-0 silk ties and divided.  My partner Dr. Lenell Antu expose the above-knee popliteal artery using an exposure just anterior to the sartorius where the muscle was reflected posteriorly and ultimately the above-knee popliteal artery was dissected out and controlled with Vesseloops.  Ultimately I used his above-knee popliteal artery exposure to dissect out the remaining great saphenous vein that we needed for enough length until we had what I thought was a long enough segment of vein.  This was transected over  right angle clamp just above the knee and this was ligated with a 2-0 silk.  I passed the vein through all the skin tunnels and then the saphenofemoral junction was oversewn with a 5-0 Prolene and vein graft passed off the field.  This was reversed and dilated  after a vessel cannula was placed.  Several branches had to be repaired with 6-0 Prolene's.  Ultimately the vein appeared to dilate to a usable diameter.  I then used a tunneler from the above-knee popliteal exposure up to the groin in a subfascial subsartorial plane.  Patient was given 100 units/kg heparin IV.  ACT was checked to confirm it was greater than 250.  I then used a Henley clamp on the distal external iliac while vessel loops on the SFA profunda and the common femoral was opened with 11 blade scalpel extended with Potts scissors.  Spatulated the vein and this was sewn in non-reversed fashion to the right common femoral artery with 5-0 Prolene parachute technique.  Once we came off clamps we did not have any pulsatile flow distally given the valves had to be lysed and I used a valvulotome and all the valves were lysed in the vein graft.  We had a good pulse in the distal bypass and pulsatile flow through it distally to the foot.  I put two medium clips distally and the vein was marked for orientation.  We then sewed this to our tunneler and it was tunneled subfascial subsartorial to the above-knee popliteal artery.  We were very careful to maintain orientation to ensure it was not twisted.  I then used baby profundus on the above-knee popliteal artery given we did not have good control with Vesseloops.  I then opened the above-knee popliteal artery with 11 blade extended with Potts scissors and end to side anastomosis was sewn with 6-0 Prolene in parachute technique.  We did de-air everything prior to completion.  We had good flow in the above-knee popliteal artery distally that augmented when the bypass was open.  With excellent signals in the foot.  Protamine was given for reversal.  All the incisions were closed with 2-0 Vicryl, 3-0 Vicryl 4-0 Monocryl, Dermabond.  She remained stable.  Case was turned over to podiatry.  Complication: None  Condition: Stable  Cephus Shelling, MD [facility] Office: [phone]   Cephus Shelling

## 2020-06-27 NOTE — Anesthesia Procedure Notes (Addendum)
Arterial Line Insertion Start/End5/10/2020 8:16 AM Performed by: Cecile Hearing, MD, anesthesiologist  Patient location: OR. Preanesthetic checklist: patient identified, IV checked, site marked, risks and benefits discussed, surgical consent, monitors and equipment checked, pre-op evaluation, timeout performed and anesthesia consent Patient sedated Right, radial was placed Catheter size: 20 G Hand hygiene performed  and maximum sterile barriers used   Attempts: 2 Procedure performed without using ultrasound guided technique. Following insertion, dressing applied and Biopatch. Post procedure assessment: normal  Post procedure complications: local hematoma (Left side). Patient tolerated the procedure well with no immediate complications. Additional procedure comments: Attempt x1 by SRNA on left, unsuccessful. Attempt x1 by CRNA on right, unsuccessful.  Attempt x2 by MDA on right, success.Marland Kitchen

## 2020-06-27 NOTE — Progress Notes (Signed)
Orthopedic Tech Progress Note Patient Details:  Iridian Reader 09/22/1915 [id number]  Ortho Devices Type of Ortho Device: Postop shoe/boot Ortho Device/Splint Location: RLE Ortho Device/Splint Interventions: Ordered,Application   Post Interventions Patient Tolerated: Well Instructions Provided: Care of device   Donald Pore 06/27/2020, 1:23 PM

## 2020-06-28 ENCOUNTER — Encounter (HOSPITAL_COMMUNITY): Payer: Self-pay | Admitting: Vascular Surgery

## 2020-06-28 ENCOUNTER — Other Ambulatory Visit (HOSPITAL_COMMUNITY): Payer: Medicare Other

## 2020-06-28 ENCOUNTER — Ambulatory Visit: Payer: Medicare Other | Admitting: Vascular Surgery

## 2020-06-28 ENCOUNTER — Encounter (HOSPITAL_COMMUNITY): Payer: Medicare Other

## 2020-06-28 DIAGNOSIS — I739 Peripheral vascular disease, unspecified: Secondary | ICD-10-CM

## 2020-06-28 DIAGNOSIS — I491 Atrial premature depolarization: Secondary | ICD-10-CM

## 2020-06-28 DIAGNOSIS — I480 Paroxysmal atrial fibrillation: Secondary | ICD-10-CM

## 2020-06-28 LAB — CBC
HCT: 28.9 % — ABNORMAL LOW (ref 36.0–46.0)
Hemoglobin: 9.3 g/dL — ABNORMAL LOW (ref 12.0–15.0)
MCH: 31.6 pg (ref 26.0–34.0)
MCHC: 32.2 g/dL (ref 30.0–36.0)
MCV: 98.3 fL (ref 80.0–100.0)
Platelets: 93 10*3/uL — ABNORMAL LOW (ref 150–400)
RBC: 2.94 MIL/uL — ABNORMAL LOW (ref 3.87–5.11)
RDW: 13.6 % (ref 11.5–15.5)
WBC: 6.2 10*3/uL (ref 4.0–10.5)
nRBC: 0 % (ref 0.0–0.2)

## 2020-06-28 LAB — LIPID PANEL
Cholesterol: 121 mg/dL (ref 0–200)
HDL: 44 mg/dL (ref >40)
LDL Cholesterol: 64 mg/dL (ref 0–99)
Total CHOL/HDL Ratio: 2.8 RATIO
Triglycerides: 63 mg/dL (ref <150)
VLDL: 13 mg/dL (ref 0–40)

## 2020-06-28 LAB — BASIC METABOLIC PANEL
Anion gap: 7 (ref 5–15)
BUN: 18 mg/dL (ref 8–23)
CO2: 24 mmol/L (ref 22–32)
Calcium: 8.1 mg/dL — ABNORMAL LOW (ref 8.9–10.3)
Chloride: 103 mmol/L (ref 98–111)
Creatinine, Ser: 0.8 mg/dL (ref 0.44–1.00)
GFR, Estimated: >60 mL/min (ref >60)
Glucose, Bld: 160 mg/dL — ABNORMAL HIGH (ref 70–99)
Potassium: 3.9 mmol/L (ref 3.5–5.1)
Sodium: 134 mmol/L — ABNORMAL LOW (ref 135–145)

## 2020-06-28 MED ORDER — ROSUVASTATIN CALCIUM 5 MG PO TABS
5.0000 mg | ORAL_TABLET | Freq: Every day | ORAL | Status: DC
  Administered 2020-06-28: 5 mg via ORAL
  Filled 2020-06-28 (×2): qty 1

## 2020-06-28 MED ORDER — METOPROLOL TARTRATE 25 MG PO TABS
25.0000 mg | ORAL_TABLET | Freq: Two times a day (BID) | ORAL | Status: DC
  Administered 2020-06-28 – 2020-06-30 (×5): 25 mg via ORAL
  Filled 2020-06-28 (×5): qty 1

## 2020-06-28 NOTE — Progress Notes (Signed)
Physical Therapy Treatment Patient Details Name: Rebekah Bridges MRN: [id number] DOB: 04/09/1915 Today's Date: 06/28/2020    History of Present Illness 87 yo female s/p R fempop artery bypass graft and resection of 1st met head with tibial sesamoid, partial resection of proximal phalanx, and drug implant 5/9. PMH includes osteoporosis (s/p vertebroplasty for fxs) and Parox. Afib (06/2019).    PT Comments    Pt received in chair, family present and with good participation and tolerance for transfer and household distance gait training. Pt with poor carryover of instruction for safety with transfers and step sequencing/RW management, she will need reinforcement for this and for activity pacing due to elevated HR. Of note, pt family reports after today, she will not have 24/7 supervision available until this Friday, case manager and RN notified, she will not be safe to discharge home until this is in place due to her high fall risk. Pt continues to benefit from PT services to progress toward functional mobility goals. Continue to recommend discharge home with HHPT once 24/7 supervision/assist available.   Follow Up Recommendations  Home health PT;Supervision/Assistance - 24 hour;Supervision for mobility/OOB (her sister arrives in town on Fri 5/13 and will provide 24/7. No Supervision at home until then.)     Equipment Recommendations  None recommended by PT    Recommendations for Other Services       Precautions / Restrictions Precautions Precautions: Fall Precaution Comments: post op shoe Restrictions Weight Bearing Restrictions: Yes RLE Weight Bearing: Weight bearing as tolerated Other Position/Activity Restrictions: encouraged heel WB    Mobility  Bed Mobility    General bed mobility comments: OOB in recliner upon entry    Transfers Overall transfer level: Needs assistance Equipment used: Rolling walker (2 wheeled) Transfers: Sit to/from Stand Sit to Stand: Min assist          General transfer comment: for safety and balance, increased time to rise  Ambulation/Gait Ambulation/Gait assistance: Min assist Gait Distance (Feet): 40 Feet Assistive device: Rolling walker (2 wheeled) Gait Pattern/deviations: Step-through pattern;Decreased stride length;Trunk flexed;Drifts right/left (forward head/downward gaze) Gait velocity: decr Gait velocity interpretation: <1.8 ft/sec, indicate of risk for recurrent falls General Gait Details: min assist to steady, guide pt trajectory. Pt with tele HR reading "irregular" rhythm toward end of mobility RN notified, gait HR max 110 bpm. Pt with difficulty sequencing step-to gait pattern and needed frequent standing breaks and instruction on technique. Family present and encouraging.   Stairs             Wheelchair Mobility    Modified Rankin (Stroke Patients Only)       Balance Overall balance assessment: Needs assistance;History of Falls Sitting-balance support: No upper extremity supported;Feet supported Sitting balance-Leahy Scale: Good     Standing balance support: Bilateral upper extremity supported;During functional activity Standing balance-Leahy Scale: Poor Standing balance comment: reliant on external and BUE assist                            Cognition Arousal/Alertness: Awake/alert Behavior During Therapy: WFL for tasks assessed/performed Overall Cognitive Status: Within Functional Limits for tasks assessed                                 General Comments: some decreased STM, but anticipate baseline; pt with limited carryover of step sequencing and safety instruction for hand placement, RN/family notified to reinforce with her.  pt tried to order lunch again after placing dinner order and her family had to remind her she had already placed her lunch order.      Exercises      General Comments General comments (skin integrity, edema, etc.): HR max 110 bpm, pt reporting  "significant" fatigue at end of session, no new drainage noted at dressing site; HR 89 bpm resting and SpO2 92-95% on RA.      Pertinent Vitals/Pain Pain Assessment: Faces Faces Pain Scale: Hurts a little bit Pain Location: R foot Pain Descriptors / Indicators: Discomfort;Sore ("intermittent" per pt) Pain Intervention(s): Monitored during session;Repositioned    Home Living Family/patient expects to be discharged to:: Private residence Living Arrangements: Alone Available Help at Discharge: Family;Available PRN/intermittently;Friend(s) Type of Home: Independent living facility Home Access: Level entry   Home Layout: One level Home Equipment: Walker - 4 wheels;Cane - single point;Grab bars - tub/shower;Shower seat      Prior Function Level of Independence: Needs assistance  Gait / Transfers Assistance Needed: pt using rollator and cane in home, states she had one fall in the past 6 months while outside requiring stitches to scalp ADL's / Homemaking Assistance Needed: Pt reports needing assist with bathing, daughter-in-law comes 1x/week to assist with this. Pt has a housekeeper for cleaning     PT Goals (current goals can now be found in the care plan section) Acute Rehab PT Goals Patient Stated Goal: go home, get back to independence PT Goal Formulation: With patient Time For Goal Achievement: 07/11/20 Potential to Achieve Goals: Good Progress towards PT goals: Progressing toward goals    Frequency    Min 4X/week      PT Plan Current plan remains appropriate       AM-PAC PT "6 Clicks" Mobility   Outcome Measure  Help needed turning from your back to your side while in a flat bed without using bedrails?: A Little Help needed moving from lying on your back to sitting on the side of a flat bed without using bedrails?: A Little Help needed moving to and from a bed to a chair (including a wheelchair)?: A Little Help needed standing up from a chair using your arms (e.g.,  wheelchair or bedside chair)?: A Little Help needed to walk in hospital room?: A Little Help needed climbing 3-5 steps with a railing? : A Little 6 Click Score: 18    End of Session Equipment Utilized During Treatment: Gait belt Activity Tolerance: Patient tolerated treatment well Patient left: in chair;with call bell/phone within reach;with family/visitor present (RN aware no chair alarm on, family present in room) Nurse Communication: Mobility status;Other (comment) (HR max 110 bpm) PT Visit Diagnosis: Difficulty in walking, not elsewhere classified (R26.2);Other abnormalities of gait and mobility (R26.89)     Time: 1250-1310 PT Time Calculation (min) (ACUTE ONLY): 20 min  Charges:  $Gait Training: 8-22 mins                     Stephaun Million P., PTA Acute Rehabilitation Services Pager: [phone] Office: [place] 06/28/2020, 1:30 PM

## 2020-06-28 NOTE — Progress Notes (Signed)
Mobility Specialist - Progress Note   06/28/20 1653  Mobility  Activity Ambulated in hall  Level of Assistance Contact guard assist, steadying assist  Assistive Device Front wheel walker  Distance Ambulated (ft) 60 ft  Mobility Ambulated with assistance in hallway  Mobility Response Tolerated fair  Mobility performed by Mobility specialist  $Mobility charge 1 Mobility   Pt's HR ranged from 89 to 120 during ambulation. Distance limited by fatigue. Pt to recliner after walk w/ family in room. She c/o nausea once sitting down. Family expressed understanding to call staff when she'd like to get in bed, or to notify staff she is in the chair if they leave beforehand. RN notified of nausea and that she is in the chair.   Mamie Levers Mobility Specialist Mobility Specialist Phone: [phone]

## 2020-06-28 NOTE — TOC Initial Note (Signed)
Transition of Care (TOC) - Initial/Assessment Note  Donn Pierini RN, BSN Transitions of Care Unit 4E- RN Case Manager See Treatment Team for direct phone #    Patient Details  Name: Rebekah Bridges MRN: [id number] Date of Birth: May 08, 1915  Transition of Care [facility]) CM/SW Contact:    Darrold Span, RN Phone Number: 06/28/2020, 2:11 PM  Clinical Narrative:                 Noted order placed for HHPT, (OT also recommending HHOT- will need to add HHOT to [facility] orders).  CM in to speak with pt as well as son and his friend at this bedside. Per conversation son lives out of town and will be return home tomorrow afternoon, sister will be coming into town on Friday to stay with pt initially.  Discussed with pt HH recommendations- pt is agreeable to [facility] services- List provided for [facility] choice- Per CMS guidelines from medicare.gov website with star ratings (copy placed in shadow chart), pt and family to review and contact this Clinical research associate tomorrow with choice selection (contact info provided).   Per pt she has needed DME for home. Family to provide transportation home.     Expected Discharge Plan: Home w Home Health Services Barriers to Discharge: Continued Medical Work up   Patient Goals and CMS Choice Patient states their goals for this hospitalization and ongoing recovery are:: return home CMS Medicare.gov Compare Post Acute Care list provided to:: Patient Choice offered to / list presented to : Patient,Adult Children  Expected Discharge Plan and Services Expected Discharge Plan: Home w Home Health Services   Discharge Planning Services: CM Consult Post Acute Care Choice: Home Health Living arrangements for the past 2 months: Single Family Home                 DME Arranged: N/A DME Agency: NA       HH Arranged: PT,OT          Prior Living Arrangements/Services Living arrangements for the past 2 months: Single Family Home Lives with:: Self Patient language and need for  interpreter reviewed:: Yes Do you feel safe going back to the place where you live?: Yes      Need for Family Participation in Patient Care: Yes (Comment) Care giver support system in place?: Yes (comment) Current home services: DME Criminal Activity/Legal Involvement Pertinent to Current Situation/Hospitalization: No - Comment as needed  Activities of Daily Living   ADL Screening (condition at time of admission) Patient's cognitive ability adequate to safely complete daily activities?: Yes Is the patient deaf or have difficulty hearing?: Yes Does the patient have difficulty seeing, even when wearing glasses/contacts?: No Patient able to express need for assistance with ADLs?: Yes Does the patient have difficulty dressing or bathing?: Yes Independently performs ADLs?: No Communication: Needs assistance Is this a change from baseline?: Pre-admission baseline Dressing (OT): Needs assistance Is this a change from baseline?: Pre-admission baseline Grooming: Needs assistance Is this a change from baseline?: Pre-admission baseline Feeding: Independent Bathing: Needs assistance Is this a change from baseline?: Pre-admission baseline Toileting: Needs assistance Is this a change from baseline?: Pre-admission baseline In/Out Bed: Independent Walks in Home: Independent  Permission Sought/Granted Permission sought to share information with : Facility Industrial/product designer granted to share information with : Yes, Verbal Permission Granted     Permission granted to share info w AGENCY: HH        Emotional Assessment Appearance:: Appears older than stated age Attitude/Demeanor/Rapport:  Engaged Affect (typically observed): Appropriate Orientation: : Oriented to Self,Oriented to Place,Oriented to  Time,Oriented to Situation Alcohol / Substance Use: Not Applicable Psych Involvement: No (comment)  Admission diagnosis:  PAD (peripheral artery disease) (HCC) [I73.9] Patient  Active Problem List   Diagnosis Date Noted  . 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 Hallux valgus with bunions of right foot   . Acute pancreatitis 07/17/2019  . Unspecified atrial fibrillation (HCC) 07/17/2019  . Peripheral polyneuropathy 01/23/2019  . Bilateral sacroiliitis (HCC) 11/05/2018  . Body mass index (BMI) 26.0-26.9, adult 11/05/2018  . Elevated blood-pressure reading, without diagnosis of hypertension 11/05/2018  . Chronic low back pain without sciatica 01/07/2015  . Degeneration of lumbar intervertebral disc 03/16/2014  . Spinal stenosis of lumbar region 03/03/2014  . Essential hypertension 02/06/2014  . Back pain 01/30/2014  . Neuropathy 01/30/2014  . PAD (peripheral artery disease) (HCC) 08/28/2012  . History of headache 08/28/2012  . HYPERLIPIDEMIA 01/24/2010  . OSTEOARTHRITIS 01/24/2010  . BACK PAIN 01/24/2010  . OSTEOPOROSIS 01/24/2010   PCP:  Dorothey Baseman, MD Pharmacy:   [facility], [address] - [place], [place] - [address] [place] [address] Phone: [phone] Fax: [phone]  [facility] - [place], [place] - [address] [place] [address] Phone: [phone] Fax: [phone]  [id number] - [place], [place] - [address] AT [facility] [address] [facility] [address] [place] [address] Phone: [phone] Fax: [phone]     Social Determinants of Health (SDOH) Interventions    Readmission Risk Interventions No flowsheet data found.

## 2020-06-28 NOTE — Progress Notes (Signed)
PHARMACIST LIPID MONITORING   Rebekah Bridges is a 87 y.o. female admitted on 06/27/2020 with history of PAD. S/p right common femoral to above-knee popliteal artery bypass with ipsilateral non-reversedgreat saphenous vein on 06/27/20.   Pharmacy has been consulted to optimize lipid-lowering therapy with the indication of secondary prevention for clinical ASCVD.  Recent Labs:  Lipid Panel (last 6 months):   Lab Results  Component Value Date   CHOL 121 06/28/2020   TRIG 63 06/28/2020   HDL 44 06/28/2020   CHOLHDL 2.8 06/28/2020   VLDL 13 06/28/2020   LDLCALC 64 06/28/2020    Hepatic function panel (last 6 months):   Lab Results  Component Value Date   AST 23 06/27/2020   ALT 9 06/27/2020   ALKPHOS 87 06/27/2020   BILITOT 0.6 06/27/2020    SCr (since admission):   Serum creatinine: 0.8 mg/dL 41/93/79 0240 Estimated creatinine clearance: 25.8 mL/min  Current therapy and lipid therapy tolerance Current lipid-lowering therapy: none Previous lipid-lowering therapies (if applicable): none Documented or reported allergies or intolerances to lipid-lowering therapies (if applicable): none  Assessment:    87 y.o female, was not taking statin therapy prior to admission. I have discussed assessment and therapy plan with Graceann Congress, PA for [facility].  LDL 64 , not on statin.  Goal LDL <55 for patient with PAD, not on statin prior to admission. Patient may benefit from adding moderate intensity statin, Crestor 5-10 mg daily for prevention of stroke or heart attack.  Patient is agreeable to start Crestor 5mg  daily.  Instructed patient to report any muscle pain, muscle cramps, muscle weakness to her MD/provider.   Plan:   1.Statin intensity (high intensity recommended for all patients regardless of the LDL):   Begin Crestor (rosuvastatin) 5 mg daily with supper.   Add moderate intensity statin as discussed with , PA with [facility]   3.Refer to lipid clinic:   No  4.Follow-up  with:  Primary care provider - Graceann Congress, MD  5.Follow-up labs after discharge:  Changes in lipid therapy were made. Check a lipid panel in 8-12 weeks then annually.       10-12, RPh Clinical Pharmacist  [phone] Please check AMION for all [facility] Pharmacy phone numbers After 10:00 PM, call Main Pharmacy [phone]  06/28/2020, 10:08 AM

## 2020-06-28 NOTE — Progress Notes (Incomplete)
{  Select Note:[id number]} 

## 2020-06-28 NOTE — Progress Notes (Addendum)
Progress Note    06/28/2020 7:35 AM 1 Day Post-Op  Subjective:  No complaints. Minimal soreness in right foot   Vitals:   06/28/20 0312 06/28/20 0731  BP: (!) 117/49 131/80  Pulse: 68 75  Resp: 20 19  Temp: 98 F (36.7 C) 98.1 F (36.7 C)  SpO2: 91% 96%   Physical Exam: Cardiac: irregularly irregular Lungs: non labored Incisions:  Right thigh incisions are clean, dry and intact. Some ecchymosis present Extremities: well perfused and warm. Right AT/ PT doppler signals. Right foot dressed. Some serosanguinous drainage through dressings Abdomen:  Flat soft, non tender Neurologic: alert and oriented  CBC    Component Value Date/Time   WBC 6.2 06/28/2020 0500   RBC 2.94 (L) 06/28/2020 0500   HGB 9.3 (L) 06/28/2020 0500   HCT 28.9 (L) 06/28/2020 0500   PLT 93 (L) 06/28/2020 0500   MCV 98.3 06/28/2020 0500   MCH 31.6 06/28/2020 0500   MCHC 32.2 06/28/2020 0500   RDW 13.6 06/28/2020 0500   LYMPHSABS 0.3 (L) 07/17/2019 0732   MONOABS 0.3 07/17/2019 0732   EOSABS 0.0 07/17/2019 0732   BASOSABS 0.0 07/17/2019 0732    BMET    Component Value Date/Time   NA 134 (L) 06/28/2020 0500   K 3.9 06/28/2020 0500   CL 103 06/28/2020 0500   CO2 24 06/28/2020 0500   GLUCOSE 160 (H) 06/28/2020 0500   BUN 18 06/28/2020 0500   BUN 26 (H) 10/27/2012 0910   CREATININE 0.80 06/28/2020 0500   CREATININE 0.68 10/27/2012 0910   CALCIUM 8.1 (L) 06/28/2020 0500   GFRNONAA >60 06/28/2020 0500   GFRNONAA >60 10/27/2012 0910   GFRAA >60 07/19/2019 0443   GFRAA >60 10/27/2012 0910    INR    Component Value Date/Time   INR 1.1 06/27/2020 0543     Intake/Output Summary (Last 24 hours) at 06/28/2020 0735 Last data filed at 06/28/2020 8416 Gross per 24 hour  Intake 2050 ml  Output 2400 ml  Net -350 ml     Assessment/Plan:  87 y.o. female is s/p 1.  Harvest of right lower extremity great saphenous vein 2.  Right common femoral to above-knee popliteal artery bypass with  ipsilateral non-reversed great saphenous vein 1 Day Post-Op. She additionally had 1) resection of first metatarsal head with tibial sesamoid 2) partial resection of proximal phalanx, 3) insertion of nonbiodegradable drug delivery device by podiatry. She is doing well post operatively. Right lower extremity incisions are clean, dry and intact. RLE is well perfused with doppler AT and PT signals. Pain well controlled. She became tachycardic with PT but they are recommending HH PT. Will consult TOC to assist with this.  Wound Care to right foot per podiatry. She is in atrial fibrillation with RVR. It appears this may be PAF as she was in afib on pre op eval but no history of atrial fibrillation. Will consult cardiology for their recommendations. She is asymptomatic. Mobilize as tolerated  DVT prophylaxis:  Sq Heparin   Dory Horn [facility] [phone] 06/28/2020 7:35 AM   I have seen and evaluated the patient. I agree with the PA note as documented above.  Postop day 1 status post right common femoral to above-knee popliteal artery bypass.  Incisions look good this morning.  She has very brisk peroneal and anterior tibial signal in the foot which is wrapped given podiatry also performed resection of the first metatarsal head for CLI with tissue loss.  She does  have A. fib with RVR this morning.  Looking at her last EKG looks like sinus rhythm.  She denies any known history of A. fib.  Will engage cardiology just to assist with recommendations.  I doubt they would recommend anticoagulation given she is 89 but will defer to them.  Foley out.  A-line out.  Out of bed mobilize PT OT.  Overall looks good.  Cephus Shelling, MD [facility] [place] Office: [phone]

## 2020-06-28 NOTE — Evaluation (Signed)
Occupational Therapy Evaluation Patient Details Name: Rebekah Bridges MRN: [id number] DOB: 06/27/1915 Today's Date: 06/28/2020    History of Present Illness 87 yo female s/p R fempop artery bypass graft and resection of 1st met head with tibial sesamoid, partial resection of proximal phalanx, and drug implant 5/9. PMH includes osteoporosis.   Clinical Impression   PTA patient independent with ADLs, IADLs and mobility using rollator. Reports having supervision for showers 1x/week, has assist cleaning but otherwise independent- loves cooking and baking.  Patient admitted for above and presenting with problem list below, including impaired balance, decreased activity tolerance, R LE pain and afib.  Patient HR ranged from mid 90s up to 145 with LB ADls/ mobility in room.  She requires up to min assist for transfers and mobility with RW, and LB ADLs.  Cognitively appears WFL, with some anticipated baseline short term memory loss--pt does not recall having PT yesterday.  She will benefit from continued OT services while admitted and after dc at HHOT level to optimize independence and return to PLOF.      Follow Up Recommendations  Home health OT;Supervision/Assistance - 24 hour    Equipment Recommendations  None recommended by OT    Recommendations for Other Services       Precautions / Restrictions Precautions Precautions: Fall Precaution Comments: post op shoe Restrictions Weight Bearing Restrictions: Yes RLE Weight Bearing: Weight bearing as tolerated Other Position/Activity Restrictions: encouraged heel WB      Mobility Bed Mobility               General bed mobility comments: OOB in recliner upon entry    Transfers Overall transfer level: Needs assistance Equipment used: Rolling walker (2 wheeled) Transfers: Sit to/from Stand Sit to Stand: Min assist         General transfer comment: for asfety and balance, increased time to rise    Balance Overall balance  assessment: Needs assistance;History of Falls Sitting-balance support: No upper extremity supported;Feet supported Sitting balance-Leahy Scale: Good     Standing balance support: Bilateral upper extremity supported;During functional activity Standing balance-Leahy Scale: Poor Standing balance comment: reliant on externaland BUE  assist                           ADL either performed or assessed with clinical judgement   ADL Overall ADL's : Needs assistance/impaired     Grooming: Sitting;Supervision/safety   Upper Body Bathing: Set up;Sitting   Lower Body Bathing: Minimal assistance;Sit to/from stand   Upper Body Dressing : Set up;Sitting   Lower Body Dressing: Minimal assistance;Sit to/from stand Lower Body Dressing Details (indicate cue type and reason): able to don/doff L sock, min assist f or R shoe; min assist in standing Toilet Transfer: Minimal assistance;Ambulation;RW Toilet Transfer Details (indicate cue type and reason): simulated in room         Functional mobility during ADLs: Minimal assistance;Rolling walker;Cueing for safety General ADL Comments: pt limited by impaired balance, decreased activity tolerance and afib     Vision   Additional Comments: further assessment, poor walker mgmt and tends to run walker into objects in room     Perception     Praxis      Pertinent Vitals/Pain Pain Assessment: Faces Faces Pain Scale: Hurts a little bit Pain Location: R foot Pain Descriptors / Indicators: Discomfort;Sore Pain Intervention(s): Limited activity within patient's tolerance;Monitored during session;Repositioned     Hand Dominance Right   Extremity/Trunk Assessment Upper   Extremity Assessment Upper Extremity Assessment: Generalized weakness   Lower Extremity Assessment Lower Extremity Assessment: Defer to PT evaluation   Cervical / Trunk Assessment Cervical / Trunk Assessment: Kyphotic   Communication Communication Communication:  HOH   Cognition Arousal/Alertness: Awake/alert Behavior During Therapy: WFL for tasks assessed/performed Overall Cognitive Status: Within Functional Limits for tasks assessed                                 General Comments: some decreased STM, but anticipate baseline   General Comments  HR up to 145 with LB dressing tasks seated, up to 136 with limited ambulation in room; RN aware    Exercises     Shoulder Instructions      Home Living Family/patient expects to be discharged to:: Private residence Living Arrangements: Alone Available Help at Discharge: Family;Available PRN/intermittently;Friend(s) Type of Home: Independent living facility Home Access: Level entry     Home Layout: One level     Bathroom Shower/Tub: Walk-in shower   Bathroom Toilet: Standard     Home Equipment: Walker - 4 wheels;Cane - single point;Grab bars - tub/shower;Shower seat          Prior Functioning/Environment Level of Independence: Needs assistance  Gait / Transfers Assistance Needed: pt using rollator and cane in home, states she had one fall in the past 6 months while outside requiring stitches to scalp ADL's / Homemaking Assistance Needed: Pt reports needing assist with bathing, daughter-in-law comes 1x/week to assist with this. Pt has a housekeeper for cleaning            OT Problem List: Decreased strength;Decreased activity tolerance;Impaired balance (sitting and/or standing);Decreased cognition;Decreased safety awareness;Decreased knowledge of use of DME or AE;Decreased knowledge of precautions;Cardiopulmonary status limiting activity;Pain      OT Treatment/Interventions: Self-care/ADL training;DME and/or AE instruction;Therapeutic activities;Balance training;Patient/family education;Cognitive remediation/compensation    OT Goals(Current goals can be found in the care plan section) Acute Rehab OT Goals Patient Stated Goal: go home, get back to independence OT Goal  Formulation: With patient Time For Goal Achievement: 07/12/20 Potential to Achieve Goals: Good  OT Frequency: Min 2X/week   Barriers to D/C:            Co-evaluation              AM-PAC OT "6 Clicks" Daily Activity     Outcome Measure Help from another person eating meals?: None Help from another person taking care of personal grooming?: A Little Help from another person toileting, which includes using toliet, bedpan, or urinal?: A Little Help from another person bathing (including washing, rinsing, drying)?: A Little Help from another person to put on and taking off regular upper body clothing?: A Little Help from another person to put on and taking off regular lower body clothing?: A Little 6 Click Score: 19   End of Session Equipment Utilized During Treatment: Rolling walker (post op shoe) Nurse Communication: Mobility status  Activity Tolerance: Patient tolerated treatment well Patient left: in chair;with call bell/phone within reach  OT Visit Diagnosis: Other abnormalities of gait and mobility (R26.89);Muscle weakness (generalized) (M62.81);Pain Pain - Right/Left: Right Pain - part of body: Ankle and joints of foot                Time: 0925-0943 OT Time Calculation (min): 18 min Charges:  OT General Charges $OT Visit: 1 Visit OT Evaluation $OT Eval Moderate Complexity: 1 Mod   B,   OT Acute Rehabilitation Services Pager [phone] Office [place] 06/28/2020, 10:34 AM

## 2020-06-28 NOTE — Consult Note (Addendum)
Cardiology Consultation:   Bridges ID: Rebekah GarrisonRuth Pogats Bridges MRN: [id number]; DOB: 03-28-1915  Admit date: 06/27/2020 Date of Consult: 06/28/2020  PCP:  Dorothey BasemanBronstein, David, Bridges   [facility] Providers Cardiologist:  Rebekah Guadalajarahomas Christie Viscomi, Bridges        [name] is a 87 y.o. female with a hx of PAD and no other significant hx except for PAF in 2021 and on pre-op eval., was admitted 06/27/20 for non healing foot wound and had arteriogram 05/26/20 who is being seen 06/28/2020 for Rebekah evaluation of atrial fib with RVR at Rebekah request of Dr. Chestine Sporelark.  History of Present Illness:   Rebekah Bridges with no reported cardiac hx but on review of EKGs did have PAF in 06/2019 with acute pancreatitis and had converted spontaneously in ER and Echo at that time with normal EF and G1DD. She has no memory of being told this.  On pre-op anesthesia eval noted to be in a fib.    Review of labs with thrombocytopenia last normal platelets 7.15.15 at 199, in 06/2019 117.    Recent non healing rt foot and artiogram with Rt SFA stenosis with unsuccessful.cannulation.  She has worsening tissue loss in Rt foot.  Family was going to pursue palliative wound care but they decided to proceed with intervention.    Dr. Chestine Sporelark saw Rebekah and did not feel Rebekah wound would heal without better blood flow.   Pt is functional and plan for Rt common femoral to likely above Rebekah knee popliteal artery bypass.   She presented yesterday for Right common femoral to above-knee popliteal artery bypass with ipsilateral non-reversed great saphenous vein and RIGHT FOOT PARTIAL RESECTION FIRST METATARSAL/ PHALANGEAL JOINT, IMPLANT NON-BIOLOGICAL DRUG IMPLANT (Right) due to osteomyelitis.  She did well post op.    With PT Rebekah HR went up 130s, still with some SR with freq PACs though at times runs of probable PAF.  Pt has no awareness of rapid heart beats.  She has no chest pian or SOB now or prior to admit.  She is active cooks freq and gives to neighbors.   Does not vacuum due to back pain.  No hx of bleeding.  No syncope.    EKG:  Rebekah EKG was personally reviewed and demonstrates:  SR with freq PACs was read as a fib.  No acute ST changes Telemetry:  Telemetry was personally reviewed and demonstrates:  SR with freq PACs.  At times runs of what appears to be PAF.  Na 134, K+ 3.9 Cr 0.80 Hgb 9.3 down from 13.1 WBC 6.2 plts 93   Currently feeling well without chest pain, sitting up in chair.  Feels great.  BP 131/80 P 78-95  Afebrile   Past Medical History:  Diagnosis Date  . Osteoporosis    s/p vertebroplasty for fracutres x 3  . PAF (paroxysmal atrial fibrillation) (HCC) 07/17/2019    Past Surgical History:  Procedure Laterality Date  . ABDOMINAL AORTOGRAM W/LOWER EXTREMITY N/A 05/26/2020   Procedure: ABDOMINAL AORTOGRAM W/LOWER EXTREMITY;  Surgeon: Rebekah Bridges, Rebekah J, Bridges;  Location: [facility];  Service: Cardiovascular;  Laterality: N/A;  . BLADDER AUGMENTATION  1964  . FEMORAL-POPLITEAL BYPASS GRAFT Right 06/27/2020   Procedure: RIGHT COOMMON FEMORAL TO ABOVE KNEE POPLITEAL ARTERY BYPASS GRAFT USING RIGHT GREATER SAPHENOUS VEIN;  Surgeon: Rebekah Bridges, Rebekah J, Bridges;  Location: [facility] OR;  Service: Vascular;  Laterality: Right;  . SPINE SURGERY  2010  . SPINE SURGERY  2011  .  SPINE SURGERY  2012  . WOUND DEBRIDEMENT Right 06/27/2020   Procedure: RIGHT FOOT PARTIAL RESECTION FIRST METATARSAL/ PHALANGEAL JOINT, IMPLANT NON-BIOLOGICAL DRUG IMPLANT;  Surgeon: Rebekah Bridges, DPM;  Location: [facility] OR;  Service: Podiatry;  Laterality: Right;     Home Medications:  Prior to Admission medications   Medication Sig Start Date End Date Taking? Authorizing Provider  beta carotene 85631 UNIT capsule Take 25,000 Units by mouth at bedtime.   Yes Provider, Historical, Bridges  cadexomer iodine (IODOSORB) 0.9 % gel Apply 1 application topically daily as needed for wound care. Bridges taking differently: Apply 1 application topically in Rebekah morning. With wound  dressing 06/01/20  Yes McDonald, Rachelle Hora, DPM  cholecalciferol (VITAMIN D) 25 MCG (1000 UNIT) tablet Take 1,000 Units by mouth at bedtime.   Yes Provider, Historical, Bridges  doxycycline (VIBRA-TABS) 100 MG tablet Take 1 tablet (100 mg total) by mouth 2 (two) times daily. 06/01/20  Yes McDonald, Rachelle Hora, DPM  ibuprofen (ADVIL) 200 MG tablet Take 200 mg by mouth every 8 (eight) hours as needed (back/knee pain).   Yes Provider, Historical, Bridges  Multiple Vitamin (MULTIVITAMIN WITH MINERALS) TABS tablet Take 1 tablet by mouth at bedtime. One-A-Day Multivitamin   Yes Provider, Historical, Bridges  RED YEAST RICE EXTRACT PO Take 1,200 mg by mouth at bedtime.   Yes Provider, Historical, Bridges  calcium carbonate (TUMS - DOSED IN MG ELEMENTAL CALCIUM) 500 MG chewable tablet Chew 1 tablet by mouth 3 (three) times daily as needed for indigestion or heartburn.    Provider, Historical, Bridges    Inpatient Medications: Scheduled Meds: . aspirin EC  81 mg Oral Daily  . Chlorhexidine Gluconate Cloth  6 each Topical Daily  . cholecalciferol  1,000 Units Oral QHS  . docusate sodium  100 mg Oral Daily  . heparin  5,000 Units Subcutaneous Q8H  . metoprolol tartrate  25 mg Oral BID  . multivitamin with minerals  1 tablet Oral QHS  . pantoprazole  40 mg Oral Daily  . rosuvastatin  5 mg Oral Q supper   Continuous Infusions: . sodium chloride    . sodium chloride 75 mL/hr at 06/27/20 1515  . magnesium sulfate bolus IVPB    . vancomycin 500 mg (06/28/20 0510)   PRN Meds: sodium chloride, acetaminophen **OR** acetaminophen, alum & mag hydroxide-simeth, bisacodyl, calcium carbonate, guaiFENesin-dextromethorphan, hydrALAZINE, labetalol, magnesium sulfate bolus IVPB, metoprolol tartrate, morphine injection, ondansetron, oxyCODONE-acetaminophen, phenol, polyethylene glycol, potassium chloride  Allergies:    Allergies  Allergen Reactions  . Hydrocodone Nausea Only  . Meloxicam Nausea Only    Upset stomach  . Other Other (See  Comments)  . Tramadol Hcl Nausea Only and Other (See Comments)    sweating  . Penicillins Nausea Only and Rash  . Sulfa Antibiotics Nausea Only, Hives and Rash    And rash    Social History:   Social History   Socioeconomic History  . Marital status: Widowed    Spouse name: Not on file  . Number of children: Not on file  . Years of education: Not on file  . Highest education level: Not on file  Occupational History  . Not on file  Tobacco Use  . Smoking status: Never Smoker  . Smokeless tobacco: Never Used  Vaping Use  . Vaping Use: Never used  Substance and Sexual Activity  . Alcohol use: Not Currently  . Drug use: No  . Sexual activity: Not on file  Other Topics Concern  . Not  on file  Social History Narrative  . Not on file   Social Determinants of Health   Financial Resource Strain: Not on file  Food Insecurity: Not on file  Transportation Needs: Not on file  Physical Activity: Not on file  Stress: Not on file  Social Connections: Not on file  Intimate Rebekah Bridges Violence: Not on file    Family History:    Family History  Problem Relation Age of Onset  . Cancer Father   . Learning disabilities Paternal Grandmother   . Cancer Sister   . Cancer Brother      ROS:  Please see Rebekah history of present illness.  General:no colds or fevers, no weight changes Skin:no rashes or ulcers HEENT:no blurred vision, no congestion CV:see HPI PUL:see HPI GI:no diarrhea constipation or melena, no indigestion GU:no hematuria, no dysuria Rebekah:no joint pain, critical limb ischemia and osteomyelitis Neuro:no syncope, no lightheadedness Endo:no diabetes, no thyroid disease  All other ROS reviewed and negative.     Physical Exam/Data:   Vitals:   06/27/20 2303 06/28/20 0312 06/28/20 0731 06/28/20 1138  BP: (!) 114/57 (!) 117/49 131/80 121/61  Pulse: 60 68 75 76  Resp: Temp: 98.6 F (37 C) 98 F (36.7 C) 98.1 F (36.7 C) 99.5 F (37.5 C)  TempSrc: Oral  Oral Oral Oral  SpO2: 94% 91% 96% 95%  Weight:      Height:        Intake/Output Summary (Last 24 hours) at 06/28/2020 1226 Last data filed at 06/28/2020 0981 Gross per 24 hour  Intake 300 ml  Output 2000 ml  Net -1700 ml   Last 3 Weights 06/27/2020 06/21/2020 05/26/2020  Weight (lbs) 117 lb 15.1 oz 118 lb 123 lb  Weight (kg) 53.5 kg 53.524 kg 55.792 kg     Body mass index is 23.82 kg/m.  General:  Well nourished, well developed, in no acute distress HEENT: normal Lymph: no adenopathy Neck: no JVD Endocrine:  No thryomegaly Vascular: No carotid bruits; pedal pulses ? palpitation bilaterally   Cardiac:  normal S1, S2; RRR; no murmur gallup rub or click Lungs:  clear to auscultation bilaterally, no wheezing, rhonchi or rales  Abd: soft, nontender, no hepatomegaly  Ext: no edema, Rt leg incision stable no erythema no drainage. , rt foot wrapped Musculoskeletal:  No deformities, BUE and BLE strength normal and equal Skin: warm and dry  Neuro:  Alert and oriented X 3 MAE follows commands , no focal abnormalities noted Psych:  Normal affect    Relevant CV Studies: Echo 07/17/19 IMPRESSIONS    1. Left ventricular ejection fraction, by estimation, is 60 to 65%. Rebekah  left ventricle has normal function. Rebekah left ventricle has no regional  wall motion abnormalities. Left ventricular diastolic parameters are  consistent with Grade I diastolic  dysfunction (impaired relaxation).  2. Right ventricular systolic function is normal. Rebekah right ventricular  size is normal. There is normal pulmonary artery systolic pressure.  3. Rhythm is normal sinus.   FINDINGS  Left Ventricle: Left ventricular ejection fraction, by estimation, is 60  to 65%. Rebekah left ventricle has normal function. Rebekah left ventricle has no  regional wall motion abnormalities. Rebekah left ventricular internal cavity  size was normal in size. There is  no left ventricular hypertrophy. Left ventricular diastolic parameters   are consistent with Grade I diastolic dysfunction (impaired relaxation).   Right Ventricle: Rebekah right ventricular size is normal. No increase in  right ventricular  wall thickness. Right ventricular systolic function is  normal. There is normal pulmonary artery systolic pressure. Rebekah tricuspid  regurgitant velocity is 2.19 m/s, and  with an assumed right atrial pressure of 10 mmHg, Rebekah estimated right  ventricular systolic pressure is 29.2 mmHg.   Left Atrium: Left atrial size was normal in size.   Right Atrium: Right atrial size was normal in size.   Pericardium: There is no evidence of pericardial effusion.   Mitral Valve: Rebekah mitral valve is normal in structure. Normal mobility of  Rebekah mitral valve leaflets. Mild mitral valve regurgitation. No evidence of  mitral valve stenosis.   Tricuspid Valve: Rebekah tricuspid valve is normal in structure. Tricuspid  valve regurgitation is mild . No evidence of tricuspid stenosis.   Aortic Valve: Rebekah aortic valve is normal in structure. Aortic valve  regurgitation is mild. Aortic regurgitation PHT measures 539 msec. No  aortic stenosis is present. Aortic valve mean gradient measures 4.0 mmHg.  Aortic valve peak gradient measures 8.4  mmHg. Aortic valve area, by VTI measures 2.91 cm.   Pulmonic Valve: Rebekah pulmonic valve was normal in structure. Pulmonic valve  regurgitation is not visualized. No evidence of pulmonic stenosis.   Aorta: Rebekah aortic root is normal in size and structure.   Venous: Rebekah inferior vena cava is normal in size with greater than 50%  respiratory variability, suggesting right atrial pressure of 3 mmHg.   IAS/Shunts: No atrial level shunt detected by color flow Doppler.   Laboratory Data:  High Sensitivity Troponin:  No results for input(s): TROPONINIHS in Rebekah last 720 hours.   Chemistry Recent Labs  Lab 06/27/20 0543 06/28/20 0500  NA 134* 134*  K 3.7 3.9  CL 100 103  CO2 28 24  GLUCOSE 111* 160*  BUN 16  18  CREATININE 0.79 0.80  CALCIUM 8.8* 8.1*  GFRNONAA >60 >60  ANIONGAP 6 7    Recent Labs  Lab 06/27/20 0543  PROT 7.1  ALBUMIN 3.4*  AST 23  ALT 9  ALKPHOS 87  BILITOT 0.6   Hematology Recent Labs  Lab 06/27/20 0543 06/28/20 0500  WBC 2.3* 6.2  RBC 4.25 2.94*  HGB 13.1 9.3*  HCT 42.2 28.9*  MCV 99.3 98.3  MCH 30.8 31.6  MCHC 31.0 32.2  RDW 13.8 13.6  PLT 129* 93*   BNPNo results for input(s): BNP, PROBNP in Rebekah last 168 hours.  DDimer No results for input(s): DDIMER in Rebekah last 168 hours.   Radiology/Studies:  MR FOOT RIGHT WO CONTRAST  Result Date: 06/26/2020 CLINICAL DATA:  Open wound right great toe status post recent debridement. Question osteomyelitis. EXAM: MRI OF Rebekah RIGHT FOREFOOT WITHOUT CONTRAST TECHNIQUE: Multiplanar, multisequence MR imaging of Rebekah right forefoot was performed. No intravenous contrast was administered. COMPARISON:  None. FINDINGS: Bones/Joint/Cartilage There is apparent soft tissue ulceration medial to Rebekah 1st metatarsophalangeal joint. There is underlying marrow T2 hyperintensity throughout Rebekah head of Rebekah 1st metatarsal as well as Rebekah tibial sesamoid and medial base of Rebekah 1st proximal phalanx. In addition, there is abnormal T1 marrow signal medially in Rebekah head of Rebekah 1st metatarsal with probable cortical destruction, highly suspicious for osteomyelitis. Minimal subchondral cyst formation in Rebekah head of Rebekah 2nd metatarsal. Rebekah additional metatarsals and digits appear normal. Rebekah alignment is normal at Rebekah Lisfranc joint. No significant joint effusions. Ligaments Rebekah Lisfranc ligament is intact. There is a suspected defect in Rebekah medial collateral ligament of Rebekah 1st metatarsophalangeal joint along its plantar metatarsal attachment,  best seen on coronal image 15/6. Muscles and Tendons Rebekah forefoot tendons are intact without tenosynovitis. Rebekah plantar slip of Rebekah peroneus longus tendon is noted to be thickened without abnormal signal. Mild  nonspecific T2 hyperintensity within Rebekah forefoot musculature. No focal fluid collection. Soft tissues As above, apparent soft tissue ulceration medial to Rebekah 1st metatarsophalangeal joint with diffuse surrounding soft tissue edema. No focal fluid collection, foreign body or soft tissue emphysema identified. This soft tissue defect is in very close proximity to Rebekah signal abnormalities in Rebekah 1st metatarsal head described above, making osteomyelitis highly likely. IMPRESSION: 1. Soft tissue ulceration medial to Rebekah 1st metatarsophalangeal joint with surrounding inflammatory changes. No focal fluid collection identified. 2. T2 and T1 marrow signal abnormalities within Rebekah 1st metatarsal head highly suspicious for osteomyelitis. In addition, there are less specific T2 marrow changes within Rebekah radial base of Rebekah 1st proximal phalanx and within Rebekah tibial sesamoid of Rebekah 1st metatarsal. 3. Possible partial tear of Rebekah medial collateral ligament of Rebekah 1st MTP joint along its plantar metatarsal attachment. Electronically Signed   By: Carey Bullocks M.D.   On: 06/26/2020 09:45   DG Foot 2 Views Right  Result Date: 06/27/2020 CLINICAL DATA:  RIGHT foot surgery EXAM: RIGHT FOOT - 2 VIEW COMPARISON:  MR RIGHT foot 06/24/2020 FINDINGS: Osseous demineralization. Interval resection of Rebekah base of Rebekah proximal phalanx of Rebekah great toe as well as Rebekah mid to distal first metatarsal. Methylmethacrylate present at Rebekah distal first metatarsal bed. Soft tissue swelling at medial foot with overlying dressing artifacts. Additional cement attenuation is seen at Rebekah midfoot. No acute fracture, dislocation, or bone destruction. Minimal calcaneal spurring. IMPRESSION: Postoperative changes as above. Electronically Signed   By: Ulyses Southward M.D.   On: 06/27/2020 14:53     Assessment and Plan:   1. PAF, post op and hx of PAF with acute pancreatitis 1 year ago.  Check echo, last year was stable. In addition while I do not see EKG it  is noted she had atrial fib on exam per anesthesia.  Add BB to prevent further PAF. CHA2DS2VASc of 4.  POD #1, will discuss with Dr. Tresa Endo.  Will check with vascular concerning Heparin. Pt did have fall with Rebekah walker, walking outside. Also plts 93  2. POD 1 Rt common femoral to above knee popliteal artery bypass with ipsilateral non-reversedgreat saphenous vein and resection of 1st metatarsal head with tibial sesamoid, partial resection of proximal 3. PVD planning on adding statin, pt has refused in Rebekah past.  LDL 64 HDL 44 4. Thrombocytopenia  Noted in labs  Today 93K   Risk Assessment/Risk Scores:          CHA2DS2-VASc Score = 4  This indicates a 4.8% annual risk of stroke. Rebekah Bridges's score is based upon: CHF History: No HTN History: No Diabetes History: No Stroke History: No Vascular Disease History: Yes Age Score: 2 Gender Score: 1         For questions or updates, please contact [facility] Please consult [email or web address] for contact info under    Signed, Rebekah Guadalajara, Bridges  06/28/2020 12:26 PM    Bridges seen and examined. Agree with assessment and plan. Rebekah Bridges is a very pleasant young appearing 87 year old female who was admitted with  a nonhealing foot wound secondary to osteomyelitis involving Rebekah right foot.  She underwent right foot partial resection of Rebekah first metatarsal/phalangeal joint and above-knee popliteal artery bypass with ipsilateral nonreverse great saphenous  vein.  Rebekah Bridges denies any significant cardiac history; however, in May 2021 she was noted to be in atrial fibrillation on ECG during an episode of acute pancreatitis and converted spontaneously in Rebekah emergency room.  An echo Doppler study showed normal EF with grade 1 diastolic dysfunction.  Yesterday, Rebekah Bridges had episodes of atrial fibrillation as well as sinus episodes with frequent PACs.  Rebekah heart rate had transiently increased to a maximum of 130.  Presently, telemetry reveals sinus  rhythm with frequent bursts of PACs.  Chest pain or shortness of breath.  On physical exam blood pressure is stable at 130/80.  Pulse is now in Rebekah 70s.  HEENT is grossly normal.  There is no JVD.  Rebekah lungs are clear.  Rhythm is predominantly regular with frequent bursts of PACs.  There is a 1/6 systolic murmur.  Abdomen is nontender.  Right foot is bandaged in a brace.  Right lower extremity is warm to touch there is no edema in Rebekah left lower extremity.  Neurologic awake she was nonfocal.  I had a long discussion with Rebekah Bridges (who is hard of hearing), Rebekah Bridges, as well as Rebekah Bridges.  Presently, I have recommended initiation of beta-blocker therapy and initially will start metoprolol 25 mg twice a day.  She is on Lovenox for DVT prophylaxis.  Platelet count is 93.  Since she is in sinus rhythm with frequent PACs rather than A. fib presently, with Rebekah low platelet count I would avoid systemic anticoagulation.  With Rebekah age of 75, she is not a candidate for long-term oral anticoagulation and would benefit from aspirin.  Hopefully beta-blocker therapy will control Rebekah rate and prevent recurrent atrial fibrillation episodes.  We will follow.   Lennette Bihari, Bridges, [facility] 06/28/2020 12:26 PM

## 2020-06-29 ENCOUNTER — Inpatient Hospital Stay (HOSPITAL_COMMUNITY): Payer: Medicare Other

## 2020-06-29 DIAGNOSIS — I4891 Unspecified atrial fibrillation: Secondary | ICD-10-CM | POA: Diagnosis not present

## 2020-06-29 LAB — ECHOCARDIOGRAM LIMITED
AV Mean grad: 5 mmHg
AV Peak grad: 10.8 mmHg
Ao pk vel: 1.64 m/s
Area-P 1/2: 3.27 cm2
Calc EF: 66.1 %
Height: 59 in
MV M vel: 4.87 m/s
MV Peak grad: 94.9 mmHg
P 1/2 time: 487 msec
Radius: 0.3 cm
Single Plane A2C EF: 59.6 %
Single Plane A4C EF: 72 %
Weight: 1887.14 oz

## 2020-06-29 LAB — SURGICAL PATHOLOGY

## 2020-06-29 NOTE — Progress Notes (Signed)
Spoke with daughter Roselyn Bering this am, she called to provide choice for [facility] services. Per [name] she has selected [facility] as first choice and [facility] as backup should [facility] be unable to accept referral. TOC will reach out to [facility] regarding referral for HHPT/OT.  Per conversation with [name] she has made arrangements to have someone stay with her spouse so she can come stay with patient should pt be medically ready to discharge before Friday. She just needs to know if patient has been cleared for discharge so she can let the folks know what her plans/needs are.  [name] also request that any new medications be filled here by the [facility] pharmacy so that she has them in hand when she leaves the hospital- please send any new scripts to [facility].   TOC will reach back out to [facility] once transition plan is known.

## 2020-06-29 NOTE — Progress Notes (Addendum)
Progress Note  Patient Name: Rebekah Bridges Date of Encounter: 06/29/2020  [facility] Cardiologist: Nicki Guadalajara, MD   Subjective   Feeling well this morning. No complaints.   Inpatient Medications    Scheduled Meds: . aspirin EC  81 mg Oral Daily  . Chlorhexidine Gluconate Cloth  6 each Topical Daily  . cholecalciferol  1,000 Units Oral QHS  . docusate sodium  100 mg Oral Daily  . heparin  5,000 Units Subcutaneous Q8H  . metoprolol tartrate  25 mg Oral BID  . multivitamin with minerals  1 tablet Oral QHS  . pantoprazole  40 mg Oral Daily  . rosuvastatin  5 mg Oral Q supper   Continuous Infusions: . sodium chloride    . sodium chloride 75 mL/hr at 06/27/20 1515  . magnesium sulfate bolus IVPB     PRN Meds: sodium chloride, acetaminophen **OR** acetaminophen, alum & mag hydroxide-simeth, bisacodyl, calcium carbonate, guaiFENesin-dextromethorphan, hydrALAZINE, labetalol, magnesium sulfate bolus IVPB, metoprolol tartrate, morphine injection, ondansetron, oxyCODONE-acetaminophen, phenol, polyethylene glycol, potassium chloride   Vital Signs    Vitals:   06/28/20 2323 06/29/20 0339 06/29/20 0535 06/29/20 0726  BP: 123/69 107/72 (!) 132/96 137/71  Pulse: 63 73  77  Resp: 16 13 15 17   Temp: 97.7 F (36.5 C) 97.8 F (36.6 C) 98 F (36.7 C) 98.6 F (37 C)  TempSrc: Oral Oral Oral Oral  SpO2: 96% 96% 96% 96%  Weight:      Height:       No intake or output data in the 24 hours ending 06/29/20 0926 Last 3 Weights 06/27/2020 06/21/2020 05/26/2020  Weight (lbs) 117 lb 15.1 oz 118 lb 123 lb  Weight (kg) 53.5 kg 53.524 kg 55.792 kg      Telemetry    SR with PACs, possible short bursts of PAF? Rates in the 70-80s mostly - Personally Reviewed  ECG    No new tracings.   Physical Exam  Very pleasant older female GEN: No acute distress.   Neck: No JVD Cardiac: RRR, + soft systolic murmur, no rubs, or gallops.  Respiratory: Clear to auscultation bilaterally. GI: Soft,  nontender, non-distended  MS: No edema; No deformity. Neuro:  Nonfocal  Psych: Normal affect   Labs    High Sensitivity Troponin:  No results for input(s): TROPONINIHS in the last 720 hours.    Chemistry Recent Labs  Lab 06/27/20 0543 06/28/20 0500  NA 134* 134*  K 3.7 3.9  CL 100 103  CO2 28 24  GLUCOSE 111* 160*  BUN 16 18  CREATININE 0.79 0.80  CALCIUM 8.8* 8.1*  PROT 7.1  --   ALBUMIN 3.4*  --   AST 23  --   ALT 9  --   ALKPHOS 87  --   BILITOT 0.6  --   GFRNONAA >60 >60  ANIONGAP 6 7     Hematology Recent Labs  Lab 06/27/20 0543 06/28/20 0500  WBC 2.3* 6.2  RBC 4.25 2.94*  HGB 13.1 9.3*  HCT 42.2 28.9*  MCV 99.3 98.3  MCH 30.8 31.6  MCHC 31.0 32.2  RDW 13.8 13.6  PLT 129* 93*    BNPNo results for input(s): BNP, PROBNP in the last 168 hours.   DDimer No results for input(s): DDIMER in the last 168 hours.   Radiology    DG Foot 2 Views Right  Result Date: 06/27/2020 CLINICAL DATA:  RIGHT foot surgery EXAM: RIGHT FOOT - 2 VIEW COMPARISON:  MR RIGHT foot 06/24/2020  FINDINGS: Osseous demineralization. Interval resection of the base of the proximal phalanx of the great toe as well as the mid to distal first metatarsal. Methylmethacrylate present at the distal first metatarsal bed. Soft tissue swelling at medial foot with overlying dressing artifacts. Additional cement attenuation is seen at the midfoot. No acute fracture, dislocation, or bone destruction. Minimal calcaneal spurring. IMPRESSION: Postoperative changes as above. Electronically Signed   By: Ulyses Southward M.D.   On: 06/27/2020 14:53    Cardiac Studies   N/a   Patient Profile     87 y.o. female with a hx of PAD and no other significant hx except for PAF in 2021 and on pre-op eval., was admitted 06/27/20 for non healing foot wound and had arteriogram 05/26/20 who is being seen 06/28/2020 for the evaluation of atrial fib with RVR at the request of Dr. Chestine Spore.  Assessment & Plan    1. Paroxsymal  Afib: developed post op and also had an episode with acute pancreatitis 1 year ago.  Echo last year was stable.  -- Not a candidate for [facility] given advanced age. Low dose BB was added yesterday with stable rates. Has remained in SR with PACs on telemetry  2. Rt common femoral to above knee popliteal artery bypass with ipsilateral non-reversedgreat saphenous vein and resection of 1st metatarsal head with tibial sesamoid, partial resection of proximal: POD 2, management per VVS  3. PVD:  Has refused statin in the past.  LDL 64 HDL 44  4. Thrombocytopenia:  Platelets 93 on labs yesterday   For questions or updates, please contact [facility] Please consult [email or web address] for contact info under    Signed, Laverda Page, NP  06/29/2020, 9:26 AM      Patient seen and examined. Agree with assessment and plan. Telemetry reveals NSR at 65, no recurrent AF; PAC no longer present. Tolerating low dose metoprolol.  Mild oozing at inner thigh staple site. No chest pain.  Will check ECG in am.   Lennette Bihari, MD, [facility] 06/29/2020 11:51 AM

## 2020-06-29 NOTE — TOC Progression Note (Signed)
Transition of Care (TOC) - Progression Note  Donn Pierini RN, BSN Transitions of Care Unit 4E- RN Case Manager See Treatment Team for direct phone #    Patient Details  Name: Rebekah Bridges MRN: [id number] Date of Birth: 1915-05-06  Transition of Care [facility]) CM/SW Contact  Zenda Alpers Lenn Sink, RN Phone Number: 06/29/2020, 5:40 PM  Clinical Narrative:    Call made to [facility] as per daughter request as first HH choice- [facility] has accepted referral for HHPT/OT.  Spoke again with daughter this afternoon to inform her that [facility] had been set up for [facility] needs, she again confirmed that she can arrange to be with pt tomorrow if pt is deemed medically stable for discharge. This writer will touch base with daughter in the am and let her know if pt is ready for d/c on 5/12.     Expected Discharge Plan: Home w Home Health Services Barriers to Discharge: Continued Medical Work up  Expected Discharge Plan and Services Expected Discharge Plan: Home w Home Health Services   Discharge Planning Services: CM Consult Post Acute Care Choice: Home Health Living arrangements for the past 2 months: Single Family Home                 DME Arranged: N/A DME Agency: NA       HH Arranged: PT,OT HH Agency: [facility] Date HH Agency Contacted: 06/29/20 Time HH Agency Contacted: 1130 Representative spoke with at [facility] Agency: Pearson Grippe   Social Determinants of Health (SDOH) Interventions    Readmission Risk Interventions No flowsheet data found.

## 2020-06-29 NOTE — Progress Notes (Signed)
*  PRELIMINARY RESULTS* Echocardiogram 2D Echocardiogram has been performed.  Neomia Dear RDCS 06/29/2020, 2:22 PM

## 2020-06-29 NOTE — Progress Notes (Addendum)
Progress Note    06/29/2020 8:23 AM 2 Days Post-Op  Subjective:  No complaints. Sitting up on side of bed eating breakfast. Minimal pain in right foot   Vitals:   06/29/20 0535 06/29/20 0726  BP: (!) 132/96 137/71  Pulse:  77  Resp: 15 17  Temp: 98 F (36.7 C) 98.6 F (37 C)  SpO2: 96% 96%   Physical Exam: Cardiac: regular Lungs:  Non labored Incisions:  Proximal right thigh incision with mild oozing.Dry gauze applied. Otherwise incisions intact. Some ecchymosis present Extremities:  Well perfused and warm with doppler At/ Peroneal signals right foot Abdomen:  Soft, non tender Neurologic: alert and oriented  CBC    Component Value Date/Time   WBC 6.2 06/28/2020 0500   RBC 2.94 (L) 06/28/2020 0500   HGB 9.3 (L) 06/28/2020 0500   HCT 28.9 (L) 06/28/2020 0500   PLT 93 (L) 06/28/2020 0500   MCV 98.3 06/28/2020 0500   MCH 31.6 06/28/2020 0500   MCHC 32.2 06/28/2020 0500   RDW 13.6 06/28/2020 0500   LYMPHSABS 0.3 (L) 07/17/2019 0732   MONOABS 0.3 07/17/2019 0732   EOSABS 0.0 07/17/2019 0732   BASOSABS 0.0 07/17/2019 0732    BMET    Component Value Date/Time   NA 134 (L) 06/28/2020 0500   K 3.9 06/28/2020 0500   CL 103 06/28/2020 0500   CO2 24 06/28/2020 0500   GLUCOSE 160 (H) 06/28/2020 0500   BUN 18 06/28/2020 0500   BUN 26 (H) 10/27/2012 0910   CREATININE 0.80 06/28/2020 0500   CREATININE 0.68 10/27/2012 0910   CALCIUM 8.1 (L) 06/28/2020 0500   GFRNONAA >60 06/28/2020 0500   GFRNONAA >60 10/27/2012 0910   GFRAA >60 07/19/2019 0443   GFRAA >60 10/27/2012 0910    INR    Component Value Date/Time   INR 1.1 06/27/2020 0543    No intake or output data in the 24 hours ending 06/29/20 0823   Assessment/Plan:  87 y.o. female is s/p  1. Harvest of right lower extremity great saphenous vein 2. Right common femoral to above-knee popliteal artery bypass with ipsilateral non-reversedgreat saphenous vein 1 Day Post-Op. She additionally had 1)  resection of first metatarsal head with tibial sesamoid 2) partial resection of proximal phalanx, 3) insertion of nonbiodegradable drug delivery device by podiatry  2 Days Post-Op  Doing well post op. Her incisions look good. Mild oozing from proximal thigh incision.Brisk pero and AT doppler signals. Minimal pain. Wound recommendations to right foot per podiatry. Some PAF and NSR with PVCs. Appreciate cardiology assistance. They have initiated Metoprolol 25 mg BID. No indication for anticoagulation. OOB/ Mobilize. HHPT/ OT orders placed. She lives alone and will not have anyone at home to assist her until Friday 5/13. Her daughter is coming to stay with her. Likely will not be able to discharge until then   DVT prophylaxis:  lovenox   Graceann Congress, PA-C [facility] [phone] 06/29/2020 8:23 AM   I have seen and evaluated the patient. I agree with the PA note as documented above.  Postop day 2 status post right common femoral to above-knee popliteal bypass with vein.  She has very brisk peroneal and AT signal in the right foot at the ankle.  Podiatry changed her dressing today.  Continue to mobilize with therapy.  Appreciate cardiology input for afib, much better rate control with metoprolol.  Potentially discharge this Friday when she has a daughter available to assist with her care.  Overall she looks very good.  Cephus Shelling, MD [facility] [place] Office: [phone]

## 2020-06-29 NOTE — Progress Notes (Signed)
Mobility Specialist - Progress Note   06/29/20 1509  Mobility  Activity Ambulated in hall  Level of Assistance Standby assist, set-up cues, supervision of patient - no hands on  Assistive Device Front wheel walker  Distance Ambulated (ft) 60 ft  Mobility Ambulated with assistance in hallway  Mobility Response Tolerated well  Mobility performed by Mobility specialist  $Mobility charge 1 Mobility   Pre-mobility: 62 HR During mobility: 72 HR Post-mobility: 69 HR  Pt asx throughout ambulation. HR remained in low 70s w/ mobility. Pt back in bed after walk, family in room.   Mamie Levers Mobility Specialist Mobility Specialist Phone: [phone]

## 2020-06-29 NOTE — Progress Notes (Signed)
   PODIATRY PROGRESS NOTE  NAME Rebekah Bridges MRN [id number] DOB Jul 05, 1915 DOA 06/27/2020   S/p first MTPJ arthroplasty with spacer RT. DOS: 06/27/2020 Patient doing well.  Resting comfortably this morning.  The dressings have been clean dry and intact since surgery.  Past Medical History:  Diagnosis Date  . Osteoporosis    s/p vertebroplasty for fracutres x 3  . PAF (paroxysmal atrial fibrillation) (HCC) 07/17/2019    CBC Latest Ref Rng & Units 06/28/2020 06/27/2020 05/26/2020  WBC 4.0 - 10.5 K/uL 6.2 2.3(L) -  Hemoglobin 12.0 - 15.0 g/dL 2.7(O) 53.6 64.4  Hematocrit 36.0 - 46.0 % 28.9(L) 42.2 38.0  Platelets 150 - 400 K/uL 93(L) 129(L) -    BMP Latest Ref Rng & Units 06/28/2020 06/27/2020 05/26/2020  Glucose 70 - 99 mg/dL 034(V) 425(Z) 90  BUN 8 - 23 mg/dL 18 16 56(L)  Creatinine 0.44 - 1.00 mg/dL 8.75 6.43 3.29  Sodium 135 - 145 mmol/L 134(L) 134(L) 138  Potassium 3.5 - 5.1 mmol/L 3.9 3.7 4.3  Chloride 98 - 111 mmol/L 103 100 100  CO2 22 - 32 mmol/L 24 28 -  Calcium 8.9 - 10.3 mg/dL 8.1(L) 8.8(L) -        Physical Exam: Neurovascular status intact.  There is some mild maceration to the incision site however the skin incision is well coapted with sutures intact.  No erythema.  No clinical evidence of infection.  Minimal edema noted    ASSESSMENT/PLAN OF CARE S/p first MTPJ arthroplasty with spacer RT -Dressings changed today.  Clean dry and intact until follow-up in the office -From a podiatry standpoint patient is okay to be discharged.  Podiatry to sign off -Weightbearing as tolerated in the postsurgical shoe - Follow-up in office within 1 week post discharge  Felecia Shelling, DPM [facility]  Dr. Felecia Shelling, DPM    [address] [place], [place] [address]                Office [phone]  Fax [phone]

## 2020-06-29 NOTE — Progress Notes (Signed)
Physical Therapy Treatment Patient Details Name: Rebekah Bridges MRN: [id number] DOB: Aug 06, 1915 Today's Date: 06/29/2020    History of Present Illness 87 yo female s/p R fempop artery bypass graft and resection of 1st met head with tibial sesamoid, partial resection of proximal phalanx, and drug implant 5/9. PMH includes osteoporosis (s/p vertebroplasty for fxs) and Parox. Afib (06/2019).    PT Comments    Pt received in chair, c/o constipation (RN notified) but agreeable to therapy session with instruction of importance of mobility to ensure regular bowel function and strengthening. Pt also given instruction on seated activities/exercises which may encourage intestinal motility including lateral leans in chair and seated trunk "circles" sitting in chair with focus on breathing and core activation. Pt performed short household distance gait task and c/o nausea with increased distance. Pt continues to benefit from PT services to progress toward functional mobility goals. Continue to recommend HHPT once level of supervision available and pt medically cleared.   Follow Up Recommendations  Home health PT;Supervision/Assistance - 24 hour;Supervision for mobility/OOB (her sister arrives in town on Fri 5/13 and will provide 24/7. No Supervision at home until then.)     Equipment Recommendations  None recommended by PT    Recommendations for Other Services       Precautions / Restrictions Precautions Precautions: Fall Precaution Comments: post op shoe Restrictions Weight Bearing Restrictions: Yes RLE Weight Bearing: Weight bearing as tolerated Other Position/Activity Restrictions: encouraged heel WB    Mobility  Bed Mobility               General bed mobility comments: OOB in recliner upon entry    Transfers Overall transfer level: Needs assistance Equipment used: Rolling walker (2 wheeled) Transfers: Sit to/from Stand Sit to Stand: Min assist         General transfer  comment: for safety and balance, increased time to rise; from recliner and chair without arms, needs increased assist from armless chair and assist to stabilize RW  Ambulation/Gait Ambulation/Gait assistance: Min assist Gait Distance (Feet): 70 Feet Assistive device: Rolling walker (2 wheeled) Gait Pattern/deviations: Step-through pattern;Decreased stride length;Trunk flexed;Drifts right/left (forward head/downward gaze) Gait velocity: decr Gait velocity interpretation: <1.8 ft/sec, indicate of risk for recurrent falls General Gait Details: min assist to steady, guide pt trajectory. Pt with tele HR WNL 60-70's bpm with exertion. Pt with difficulty sequencing smaller steps but reports R foot feels less painful this afternoon. poor management of RW and tends to run into objects in tight spaces in room. Family present and encouraging.   Stairs             Wheelchair Mobility    Modified Rankin (Stroke Patients Only)       Balance Overall balance assessment: Needs assistance;History of Falls Sitting-balance support: No upper extremity supported;Feet supported Sitting balance-Leahy Scale: Good     Standing balance support: Bilateral upper extremity supported;During functional activity Standing balance-Leahy Scale: Poor Standing balance comment: reliant on external and BUE assist                            Cognition Arousal/Alertness: Awake/alert Behavior During Therapy: WFL for tasks assessed/performed Overall Cognitive Status: Within Functional Limits for tasks assessed                                 General Comments: some decreased STM, but anticipate baseline; pt with  limited carryover of safety instruction for transfers, family notified to reinforce with her. pt requesting stool softener but forgot RN had already given her one this morning      Exercises      General Comments General comments (skin integrity, edema, etc.): BP 115/61 (78)  post-exertion, pt reports feeling "unwell" but not dizzy just some nausea; HR 61-73 bpm with exertion      Pertinent Vitals/Pain Pain Assessment: Faces Faces Pain Scale: Hurts a little bit Pain Location: R foot Pain Descriptors / Indicators: Discomfort ("intermittent" per pt) Pain Intervention(s): Monitored during session;Limited activity within patient's tolerance;Repositioned           PT Goals (current goals can now be found in the care plan section) Acute Rehab PT Goals Patient Stated Goal: go home, get back to independence PT Goal Formulation: With patient Time For Goal Achievement: 07/11/20 Potential to Achieve Goals: Good Progress towards PT goals: Progressing toward goals    Frequency    Min 4X/week      PT Plan Current plan remains appropriate       AM-PAC PT "6 Clicks" Mobility   Outcome Measure  Help needed turning from your back to your side while in a flat bed without using bedrails?: A Little Help needed moving from lying on your back to sitting on the side of a flat bed without using bedrails?: A Little Help needed moving to and from a bed to a chair (including a wheelchair)?: A Little Help needed standing up from a chair using your arms (e.g., wheelchair or bedside chair)?: A Little Help needed to walk in hospital room?: A Little Help needed climbing 3-5 steps with a railing? : A Little 6 Click Score: 18    End of Session Equipment Utilized During Treatment: Gait belt Activity Tolerance: Patient tolerated treatment well;Other (comment) (nausea and c/o constipation) Patient left: in chair;with call bell/phone within reach;with family/visitor present;Other (comment) (pt sitting up on cushion on top of BSC at sink, waiting for NT to give her a bath, NT notified per her request) Nurse Communication: Mobility status;Other (comment) (nausea/constipation) PT Visit Diagnosis: Difficulty in walking, not elsewhere classified (R26.2);Other abnormalities of gait  and mobility (R26.89)     Time: 4492-0100 PT Time Calculation (min) (ACUTE ONLY): 18 min  Charges:  $Gait Training: 8-22 mins                     Shed Nixon P., PTA Acute Rehabilitation Services Pager: [phone] Office: [place] 06/29/2020, 12:24 PM

## 2020-06-30 ENCOUNTER — Other Ambulatory Visit (HOSPITAL_COMMUNITY): Payer: Self-pay

## 2020-06-30 MED ORDER — METOPROLOL TARTRATE 25 MG PO TABS
25.0000 mg | ORAL_TABLET | Freq: Two times a day (BID) | ORAL | 2 refills | Status: DC
  Filled 2020-06-30: qty 60, 30d supply, fill #0

## 2020-06-30 MED ORDER — ASPIRIN 81 MG PO TBEC
81.0000 mg | DELAYED_RELEASE_TABLET | Freq: Every day | ORAL | 11 refills | Status: AC
  Filled 2020-06-30: qty 30, 30d supply, fill #0

## 2020-06-30 MED ORDER — OXYCODONE-ACETAMINOPHEN 5-325 MG PO TABS
1.0000 | ORAL_TABLET | ORAL | 0 refills | Status: DC | PRN
  Filled 2020-06-30: qty 8, 2d supply, fill #0

## 2020-06-30 NOTE — Progress Notes (Signed)
Physical Therapy Treatment Patient Details Name: Rebekah Bridges MRN: [id number] DOB: 20-Sep-1915 Today's Date: 06/30/2020    History of Present Illness 87 yo female s/p R fempop artery bypass graft and resection of 1st met head with tibial sesamoid, partial resection of proximal phalanx, and drug implant 5/9. PMH includes osteoporosis (s/p vertebroplasty for fxs) and Parox. Afib (06/2019).    PT Comments    Pt received in chair, agreeable to therapy session and good tolerance for gait progression with RW and transfer/exercise training. Pt needs safety cues for hand placement but improved activity tolerance this date, fatigues quickly but not limited by pain. VSS on RA and HR WNL. Pt continues to benefit from PT services to progress toward functional mobility goals. Anticipate pt safe to DC home with HHPT once level of supervision available and pt medically cleared.  Follow Up Recommendations  Home health PT;Supervision/Assistance - 24 hour;Supervision for mobility/OOB     Equipment Recommendations  None recommended by PT    Recommendations for Other Services       Precautions / Restrictions Precautions Precautions: Fall Precaution Comments: post op shoe Restrictions Weight Bearing Restrictions: Yes (Simultaneous filing. User may not have seen previous data.) RLE Weight Bearing: Weight bearing as tolerated (Simultaneous filing. User may not have seen previous data.) Other Position/Activity Restrictions: encouraged heel WB    Mobility  Bed Mobility Overal bed mobility: Needs Assistance Bed Mobility: Supine to Sit     Supine to sit: Supervision     General bed mobility comments: OOB in recliner upon entry (Simultaneous filing. User may not have seen previous data.)    Transfers Overall transfer level: Needs assistance (Simultaneous filing. User may not have seen previous data.) Equipment used: Rolling walker (2 wheeled) (Simultaneous filing. User may not have seen previous  data.) Transfers: Sit to/from Stand (Simultaneous filing. User may not have seen previous data.) Sit to Stand: Supervision;Min guard (Simultaneous filing. User may not have seen previous data.)         General transfer comment: cues for hand placement; x4 total reps from recliner with Supervision and x1 from toilet, min guard to sit to low toilet surface (Simultaneous filing. User may not have seen previous data.)  Ambulation/Gait Ambulation/Gait assistance: Supervision Gait Distance (Feet): 80 Feet (33f, 243fx2 with seated break between each) Assistive device: Rolling walker (2 wheeled) Gait Pattern/deviations: Step-through pattern;Decreased stride length;Trunk flexed;Drifts right/left Gait velocity: decr Gait velocity interpretation: <1.31 ft/sec, indicative of household ambulator General Gait Details: Pt with tele HR WNL 60-85 bpm with exertion. Pt with difficulty using BUE to offload RLE but reports R foot/leg feels very little pain.   Stairs             Wheelchair Mobility    Modified Rankin (Stroke Patients Only)       Balance Overall balance assessment: Needs assistance;History of Falls (Simultaneous filing. User may not have seen previous data.) Sitting-balance support: No upper extremity supported;Feet supported (Simultaneous filing. User may not have seen previous data.) Sitting balance-Leahy Scale: Good (Simultaneous filing. User may not have seen previous data.)     Standing balance support: Bilateral upper extremity supported;During functional activity (Simultaneous filing. User may not have seen previous data.) Standing balance-Leahy Scale: Fair (Simultaneous filing. User may not have seen previous data.) Standing balance comment: able to stand at sink to wash hands no LOB (Simultaneous filing. User may not have seen previous data.)  Cognition Arousal/Alertness: Awake/alert Behavior During Therapy: WFL for tasks  assessed/performed Overall Cognitive Status: Within Functional Limits for tasks assessed                                 General Comments: continues to demonstrate some decreased STM requires cueing for hand placement for transfers, with fair carryover initally but with delayed recall poor carryover; per OT she uses rollator so encouraged mobility tech to reinforce safety with rollator after PT session      Exercises General Exercises - Lower Extremity Ankle Circles/Pumps: AROM;Both;15 reps;Supine Gluteal Sets: AROM;Both;5 reps;Seated Long Arc Quad: AROM;Both;10 reps;Seated Hip ABduction/ADduction: AROM;Both;10 reps;Supine Hip Flexion/Marching: AROM;Both;5 reps;Seated    General Comments General comments (skin integrity, edema, etc.): VSS on RA, minimal drainage from thigh incisions visible on chair pad (Simultaneous filing. User may not have seen previous data.)      Pertinent Vitals/Pain Pain Assessment: 0-10 Pain Score: 1  Pain Location: RLE Pain Descriptors / Indicators: Discomfort Pain Intervention(s): Monitored during session;Repositioned ("it's very mild")    Home Living                      Prior Function            PT Goals (current goals can now be found in the care plan section) Acute Rehab PT Goals Patient Stated Goal: go home, get back to independence (Simultaneous filing. User may not have seen previous data.) PT Goal Formulation: With patient Time For Goal Achievement: 07/11/20 Potential to Achieve Goals: Good Progress towards PT goals: Progressing toward goals    Frequency    Min 4X/week      PT Plan Current plan remains appropriate    Co-evaluation              AM-PAC PT "6 Clicks" Mobility   Outcome Measure  Help needed turning from your back to your side while in a flat bed without using bedrails?: A Little Help needed moving from lying on your back to sitting on the side of a flat bed without using bedrails?: A  Little Help needed moving to and from a bed to a chair (including a wheelchair)?: A Little Help needed standing up from a chair using your arms (e.g., wheelchair or bedside chair)?: A Little Help needed to walk in hospital room?: A Little Help needed climbing 3-5 steps with a railing? : A Little 6 Click Score: 18    End of Session Equipment Utilized During Treatment: Gait belt Activity Tolerance: Patient tolerated treatment well Patient left: in chair;with call bell/phone within reach Nurse Communication: Mobility status PT Visit Diagnosis: Difficulty in walking, not elsewhere classified (R26.2);Other abnormalities of gait and mobility (R26.89)     Time: 5573-2202 PT Time Calculation (min) (ACUTE ONLY): 23 min  Charges:  $Gait Training: 8-22 mins $Therapeutic Exercise: 8-22 mins                     Elka Satterfield P., PTA Acute Rehabilitation Services Pager: [phone] Office: [place] 06/30/2020, 10:27 AM

## 2020-06-30 NOTE — Progress Notes (Signed)
    Review of telemetry, remains in SR. No plans for anticoagulation given advanced age. She is hopeful for discharge today.   Will arrange for outpatient follow up in the office in a couple of weeks.   Signed, Laverda Page, NP-C 06/30/2020, 10:17 AM

## 2020-06-30 NOTE — Discharge Summary (Signed)
Discharge Summary     Rebekah GarrisonRuth Pogats Raney Feb 17, 1916 [age] y.o. female  [id number]  Admission Date: 06/27/2020  Discharge Date: 06/30/2020 Physician: Cephus Shellinglark, Christopher J, MD  Admission Diagnosis: PAD (peripheral artery disease) (HCC) [I73.9]  HPI:   This is a [age] y.o. female critical limb ischemia of the right lower extremity with tissue loss, osteo of 1st metatarsal and chronic right foot ulcer.  Hospital Course:  The patient was admitted to the hospital and taken to the operating room on 06/27/2020 and underwent:right common femoral to above-knee popliteal artery bypass with ipsilateral non-reversedgreat saphenous vein. She tolerated the procedure well.   Podiatry was consulted the day of admission for chronic right foot ulcer with osteomyelitis and she underwent RIGHT FOOT PARTIAL RESECTION FIRST METATARSAL/ PHALANGEAL JOINT, IMPLANT NON-BIOLOGICAL DRUG IMPLANT (Right) at the conclusion of her vascular procedure  Findings:  Right great saphenous vein was harvested from the saphenofemoral junction down to just above the knee and was of moderate caliber.  This was initially sewn to the common femoral artery end to side in non-reversed fashion and all the valves were lysed with good pulsatile flow distally.  I then tunneled vein graft subfascial subsartorial to the above-knee popliteal artery where end-to-side anastomosis was performed.  She had triphasic signals at the ankle in the peroneal artery at completion.  The pt tolerated the procedure well and was transported to the PACU in excellent condition.   By POD 1, her VVS and she was afebrile. Her heart rhythm was consistent with atrial fibrillation with RVR. Cardiology was consulted. Telemetry with some PAF and PVCs. She has no chest pain or SOB, but was tachycardic when walking with PT. Beta blocker therapy intimated and no indication for anticoagulation. 2D echo completed. She had brisk AT and peroneal artery Doppler signals.  On POD 2,  she remained stable and her pain was controlled. Her VS remained stable.   On POD 3, her right foot surgical dressing was dry and intact. Podiatry finalized recommendations.  The remainder of the hospital course consisted of increasing mobilization and increasing intake of solids without difficulty.  Physical therapy recommended home health PT.   CBC    Component Value Date/Time   WBC 6.2 06/28/2020 0500   RBC 2.94 (L) 06/28/2020 0500   HGB 9.3 (L) 06/28/2020 0500   HCT 28.9 (L) 06/28/2020 0500   PLT 93 (L) 06/28/2020 0500   MCV 98.3 06/28/2020 0500   MCH 31.6 06/28/2020 0500   MCHC 32.2 06/28/2020 0500   RDW 13.6 06/28/2020 0500   LYMPHSABS 0.3 (L) 07/17/2019 0732   MONOABS 0.3 07/17/2019 0732   EOSABS 0.0 07/17/2019 0732   BASOSABS 0.0 07/17/2019 0732    BMET    Component Value Date/Time   NA 134 (L) 06/28/2020 0500   K 3.9 06/28/2020 0500   CL 103 06/28/2020 0500   CO2 24 06/28/2020 0500   GLUCOSE 160 (H) 06/28/2020 0500   BUN 18 06/28/2020 0500   BUN 26 (H) 10/27/2012 0910   CREATININE 0.80 06/28/2020 0500   CREATININE 0.68 10/27/2012 0910   CALCIUM 8.1 (L) 06/28/2020 0500   GFRNONAA >60 06/28/2020 0500   GFRNONAA >60 10/27/2012 0910   GFRAA >60 07/19/2019 0443   GFRAA >60 10/27/2012 0910     Discharge Instructions    Discharge patient   Complete by: As directed    Discharge disposition: 06-Home-Health Care Svc   Discharge patient date: 06/29/2020   Face-to-face encounter (required for Medicare/Medicaid patients)   Complete by:  As directed    I Milinda Antis certify that this patient is under my care and that I, or a nurse practitioner or physician's assistant working with me, had a face-to-face encounter that meets the physician face-to-face encounter requirements with this patient on 06/30/2020. The encounter with the patient was in whole, or in part for the following medical condition(s) which is the primary reason for home health care (List medical  condition): PAD, osteomyelitis   The encounter with the patient was in whole, or in part, for the following medical condition, which is the primary reason for home health care: PAD   I certify that, based on my findings, the following services are medically necessary home health services: Physical therapy   Reason for Medically Necessary Home Health Services: Therapy- Investment banker, operational, Patent examiner   My clinical findings support the need for the above services: Pain interferes with ambulation/mobility   Further, I certify that my clinical findings support that this patient is homebound due to: Pain interferes with ambulation/mobility   Home Health   Complete by: As directed    To provide the following care/treatments: PT      Discharge Diagnosis:  PAD (peripheral artery disease) (HCC) [I73.9]  Secondary Diagnosis: Patient Active Problem List   Diagnosis Date Noted  . 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 Hallux valgus with bunions of right foot   . Acute pancreatitis 07/17/2019  . Unspecified atrial fibrillation (HCC) 07/17/2019  . Peripheral polyneuropathy 01/23/2019  . Bilateral sacroiliitis (HCC) 11/05/2018  . Body mass index (BMI) 26.0-26.9, adult 11/05/2018  . Elevated blood-pressure reading, without diagnosis of hypertension 11/05/2018  . Chronic low back pain without sciatica 01/07/2015  . Degeneration of lumbar intervertebral disc 03/16/2014  . Spinal stenosis of lumbar region 03/03/2014  . Essential hypertension 02/06/2014  . Back pain 01/30/2014  . Neuropathy 01/30/2014  . PAD (peripheral artery disease) (HCC) 08/28/2012  . History of headache 08/28/2012  . HYPERLIPIDEMIA 01/24/2010  . OSTEOARTHRITIS 01/24/2010  . BACK PAIN 01/24/2010  . OSTEOPOROSIS 01/24/2010   Past Medical History:  Diagnosis Date  . Osteoporosis    s/p vertebroplasty for fracutres x 3  . PAF (paroxysmal atrial fibrillation) (HCC) 07/17/2019      Allergies as of 06/30/2020      Reactions   Hydrocodone Nausea Only   Meloxicam Nausea Only   Upset stomach   Other Other (See Comments)   Tramadol Hcl Nausea Only, Other (See Comments)   sweating   Penicillins Nausea Only, Rash   Sulfa Antibiotics Nausea Only, Hives, Rash   And rash      Medication List    STOP taking these medications   doxycycline 100 MG tablet Commonly known as: VIBRA-TABS     TAKE these medications   aspirin 81 MG EC tablet Take 1 tablet (81 mg total) by mouth daily. Swallow whole.   beta carotene 93570 UNIT capsule Take 25,000 Units by mouth at bedtime.   calcium carbonate 500 MG chewable tablet Commonly known as: TUMS - dosed in mg elemental calcium Chew 1 tablet by mouth 3 (three) times daily as needed for indigestion or heartburn.   cholecalciferol 25 MCG (1000 UNIT) tablet Commonly known as: VITAMIN D Take 1,000 Units by mouth at bedtime.   ibuprofen 200 MG tablet Commonly known as: ADVIL Take 200 mg by mouth every 8 (eight) hours as needed (back/knee pain).   Iodosorb 0.9 % gel Generic drug: cadexomer iodine  Apply 1 application topically daily as needed for wound care. What changed:   when to take this  additional instructions   metoprolol tartrate 25 MG tablet Commonly known as: LOPRESSOR Take 1 tablet (25 mg total) by mouth 2 (two) times daily.   multivitamin with minerals Tabs tablet Take 1 tablet by mouth at bedtime. One-A-Day Multivitamin   oxyCODONE-acetaminophen 5-325 MG tablet Commonly known as: Percocet Take 1 tablet by mouth every 4 (four) hours as needed for severe pain.   RED YEAST RICE EXTRACT PO Take 1,200 mg by mouth at bedtime.       Discharge Instructions: [facility] Discharge instructions Lower Extremity Bypass Surgery  Please refer to the following instruction for your post-procedure care. Your surgeon or physician assistant will discuss any changes with  you.  Activity  You are encouraged to walk as much as you can. You can slowly return to normal activities during the month after your surgery. Avoid strenuous activity and heavy lifting until your doctor tells you it's OK. Avoid activities such as vacuuming or swinging a golf club. Do not drive until your doctor give the OK and you are no longer taking prescription pain medications. It is also normal to have difficulty with sleep habits, eating and bowel movement after surgery. These will go away with time.  Bathing/Showering  You may shower after you go home. Do not soak in a bathtub, hot tub, or swim until the incision heals completely.  Incision Care  Clean your incision with mild soap and water. Shower every day. Pat the area dry with a clean towel. You do not need a bandage unless otherwise instructed. Do not apply any ointments or creams to your incision. If you have open wounds you will be instructed how to care for them or a visiting nurse may be arranged for you. If you have staples or sutures along your incision they will be removed at your post-op appointment. You may have skin glue on your incision. Do not peel it off. It will come off on its own in about one week.  Wash the groin wound with soap and water daily and pat dry. (No tub bath-only shower)  Then put a dry gauze or washcloth in the groin to keep this area dry to help prevent wound infection.  Do this daily and as needed.  Do not use Vaseline or neosporin on your incisions.  Only use soap and water on your incisions and then protect and keep dry.  Diet  Resume your normal diet. There are no special food restrictions following this procedure. A low fat/ low cholesterol diet is recommended for all patients with vascular disease. In order to heal from your surgery, it is CRITICAL to get adequate nutrition. Your body requires vitamins, minerals, and protein. Vegetables are the best source of vitamins and minerals. Vegetables also  provide the perfect balance of protein. Processed food has little nutritional value, so try to avoid this.  Medications  Resume taking all your medications unless your doctor or Physician Assistant tells you not to. If your incision is causing pain, you may take over-the-counter pain relievers such as acetaminophen (Tylenol). If you were prescribed a stronger pain medication, please aware these medication can cause nausea and constipation. Prevent nausea by taking the medication with a snack or meal. Avoid constipation by drinking plenty of fluids and eating foods with high amount of fiber, such as fruits, vegetables, and grains. Take Colace 100 mg (an over-the-counter  stool softener) twice a day as needed for constipation.  Do not take Tylenol if you are taking prescription pain medications.  Follow Up  Our office will schedule a follow up appointment 2-3 weeks following discharge.  Please call us immediately for any of the following conditions  .Severe or worsening pain in your legs or feet while at rest or while walking .Increase pain, redness, warmth, or drainage (pus) from your incision site(s) . Fever of 101 degree or higher . The swelling in your leg with the bypass suddenly worsens and becomes more painful than when you were in the hospital . If you have been instructed to feel your graft pulse then you should do so every day. If you can no longer feel this pulse, call the office immediately. Not all patients are given this instruction. .  Leg swelling is common after leg bypass surgery.  The swelling should improve over a few months following surgery. To improve the swelling, you may elevate your legs above the level of your heart while you are sitting or resting. Your surgeon or physician assistant may ask you to apply an ACE wrap or wear compression (TED) stockings to help to reduce swelling.  Reduce your risk of vascular disease  Stop smoking. If you would like help call  [facility] at 1-800-QUIT-NOW (3188309677) or [place] at (641)678-0586.  . Manage your cholesterol . Maintain a desired weight . Control your diabetes weight . Control your diabetes . Keep your blood pressure down .  If you have any questions, please call the office at [phone]   Prescriptions given: 1.  Roxicet #15 No Refill 2.  Metoprolol 25mg   Disposition: home with home health  Patient's condition: is Excellent  Follow up: 1. Dr. in 2 weeks   Chestine Spore, PA-C [facility] [phone] 06/30/2020  10:10 AM  - For VQI Registry use ---   Post-op:  Wound infection: No  Graft infection: No  Transfusion: No    If yes, 0 units given New Arrhythmia: Yes Ipsilateral amputation: No, [ ]  Minor, [ ]  BKA, [ ]  AKA Discharge patency: [x ] Primary, [ ]  Primary assisted, [ ]  Secondary, [ ]  Occluded Patency judged by: [x ] Dopper only, [ ]  Palpable graft pulse, []  Palpable distal pulse, [ ]  ABI inc. > 0.15, [ ]  Duplex Discharge ABI: R , L  D/C Ambulatory Status: Ambulatory with Assistance  Complications: MI: No, [ ]  Troponin only, [ ]  EKG or Clinical  CHF: No Resp failure:No, [ ]  Pneumonia, [ ]  Ventilator Chg in renal function: No, [ ]  Inc. Cr > 0.5, [ ]  Temp. Dialysis,  [ ]  Permanent dialysis Stroke: No, [ ]  Minor, [ ]  Major Return to OR: No  Reason for return to OR: [ ]  Bleeding, [ ]  Infection, [ ]  Thrombosis, [ ]  Revision  Discharge medications: Statin use:  no ASA use:  yes Plavix use:  no Beta blocker use: yes CCB use:  No ACEI use:   no ARB use:  no Coumadin use: no

## 2020-06-30 NOTE — Progress Notes (Addendum)
VASCULAR SURGERY ASSESSMENT & PLAN:   3 Days Post-Op PAD: s/p right common femoral to above-knee popliteal artery bypass with ipsilateral non-reversedgreat saphenous vein secondary to critical limb ischemia of the right lower extremity with tissue loss. RLE well perfused. VSS. Afebrile. Acute blood loss anemia without symptoms.  Osteo of 1st metatarsal and chronic right foot ulcer: s/p first MTPJ arthroplasty with spacer RT by podiatry 06/27/2020. Wieght bear with post-op shoe (is in place)  Disposition: home with HHPT. Refill metoprolol. Follow-up with Dr. Chestine Spore in 2-3 weeks.  SUBJECTIVE:   She has no complaints this morning.  Tolerating her diet.  Physical therapy has recommended home health/PT. Podiatry has signed off.   PHYSICAL EXAM:   Vitals:   06/29/20 1625 06/29/20 1936 06/29/20 2255 06/30/20 0449  BP: (!) 156/68 (!) 116/92 (!) 124/92 (!) 150/110  Pulse: 70 65 69 61  Resp: 18 20 16 15   Temp: 98 F (36.7 C) 98 F (36.7 C) 97.9 F (36.6 C) 98.2 F (36.8 C)  TempSrc: Oral Oral Oral Oral  SpO2: 98% 97% 93% 98%  Weight:      Height:       General appearance: Awake, alert in no apparent distress Cardiac: Heart rate and rhythm are regular Respirations: Nonlabored Incisions: right groin, thigh and lower leg incisions are all well approximated without bleeding or hematoma Extremities: Right foot surgical dressing is dry and intact. She has brisk anterior tibial and peroneal artery Doppler signals.  LABS:   Lab Results  Component Value Date   WBC 6.2 06/28/2020   HGB 9.3 (L) 06/28/2020   HCT 28.9 (L) 06/28/2020   MCV 98.3 06/28/2020   PLT 93 (L) 06/28/2020   Lab Results  Component Value Date   CREATININE 0.80 06/28/2020   Lab Results  Component Value Date   INR 1.1 06/27/2020   CBG (last 3)  No results for input(s): GLUCAP in the last 72 hours.  PROBLEM LIST:    Active Problems:   PAD (peripheral artery disease) (HCC)   Chronic osteomyelitis of right  foot with draining sinus (HCC)   Hallux valgus with bunions of right foot   CURRENT MEDS:   . aspirin EC  81 mg Oral Daily  . Chlorhexidine Gluconate Cloth  6 each Topical Daily  . cholecalciferol  1,000 Units Oral QHS  . docusate sodium  100 mg Oral Daily  . heparin  5,000 Units Subcutaneous Q8H  . metoprolol tartrate  25 mg Oral BID  . multivitamin with minerals  1 tablet Oral QHS  . pantoprazole  40 mg Oral Daily  . rosuvastatin  5 mg Oral Q supper    08/27/2020 Office: [phone] 06/30/2020    I have seen and evaluated the patient. I agree with the PA note as documented above.  Postop day 3 status post right common femoral to above-knee popliteal bypass with vein for critical limb ischemia with tissue loss.  Continues to have triphasic peroneal and anterior tibial signals at the right ankle.  All of her incisions look good.  Podiatry is satisfied with her amputation site and recommended weightbearing as tolerated with the shoe and follow-up in one week with podiatry.  Plan discharge today.  We will arrange follow-up in 2 to 3 weeks in our office for wound check.  She has family at home now that can provide care.  We will also discharge on 25 mg metoprolol twice daily for her A. fib and appreciate cardiology input.  She  is very stable and overall looks very good.  Cephus Shelling, MD [facility] [place] Office: [phone]

## 2020-06-30 NOTE — Progress Notes (Signed)
Occupational Therapy Treatment Patient Details Name: Rebekah Bridges MRN: [id number] DOB: 1915/11/27 Today's Date: 06/30/2020    History of present illness 87 yo female s/p R fempop artery bypass graft and resection of 1st met head with tibial sesamoid, partial resection of proximal phalanx, and drug implant 5/9. PMH includes osteoporosis (s/p vertebroplasty for fxs) and Parox. Afib (06/2019).   OT comments  Patient supine in bed and agreeable to OT.  Completing bed mobility with supervision, transfers with min guard fading to supervision and in room mobility with min guard using RW.  She continues to require min cueing for hand placement during transfers, improved RW mgmt during session.  Pt completing toileting with supervision, grooming standing with supervision and LB dressing with supervision.  She will have assist at home from daughter starting 5/13, but reports DIL and neighbors can assist until daughter arrives.  Voiced understanding with concerns and recommendations.  Will follow acutely.    Follow Up Recommendations  Home health OT;Supervision/Assistance - 24 hour (daughter coming 5/13, but reports DIL and neighbors can assist until she arrives)    Equipment Recommendations  None recommended by OT    Recommendations for Other Services      Precautions / Restrictions Precautions Precautions: Fall Precaution Comments: post op shoe Restrictions Weight Bearing Restrictions: Yes RLE Weight Bearing: Weight bearing as tolerated Other Position/Activity Restrictions: encouraged heel WB       Mobility Bed Mobility Overal bed mobility: Needs Assistance Bed Mobility: Supine to Sit     Supine to sit: Supervision     General bed mobility comments: no physical assist required    Transfers Overall transfer level: Needs assistance Equipment used: Rolling walker (2 wheeled) Transfers: Sit to/from Stand Sit to Stand: Min guard;Supervision         General transfer comment:  min guard fading to supervision from EOB, recliner and commode; cueing for hand placement with decreased carryover    Balance Overall balance assessment: Needs assistance;History of Falls Sitting-balance support: No upper extremity supported;Feet supported Sitting balance-Leahy Scale: Good     Standing balance support: Bilateral upper extremity supported;During functional activity;No upper extremity supported Standing balance-Leahy Scale: Fair Standing balance comment: relies on BUE support dynamically but able to engage in ADLs with close supervision with 0-1 hand support                           ADL either performed or assessed with clinical judgement   ADL Overall ADL's : Needs assistance/impaired     Grooming: Wash/dry hands;Oral care;Supervision/safety;Standing               Lower Body Dressing: Supervision/safety;Sit to/from stand Lower Body Dressing Details (indicate cue type and reason): able to manage L sock and R post op shoe, close supervision sit to stand Toilet Transfer: Supervision/safety;Ambulation;RW   Toileting- Clothing Manipulation and Hygiene: Supervision/safety;Sit to/from stand       Functional mobility during ADLs: Rolling walker;Cueing for safety;Min guard;Supervision/safety General ADL Comments: min guard fading to supervision with in room mobility     Vision       Perception     Praxis      Cognition Arousal/Alertness: Awake/alert Behavior During Therapy: [facility] for tasks assessed/performed Overall Cognitive Status: Within Functional Limits for tasks assessed                                 General  Comments: continues to demonstrate some decreased STM requires cueing for hand placement for transfers        Exercises     Shoulder Instructions       General Comments VSS    Pertinent Vitals/ Pain       Pain Assessment: No/denies pain  Home Living                                           Prior Functioning/Environment              Frequency  Min 2X/week        Progress Toward Goals  OT Goals(current goals can now be found in the care plan section)  Progress towards OT goals: Progressing toward goals  Acute Rehab OT Goals Patient Stated Goal: go home, get back to independence OT Goal Formulation: With patient  Plan Discharge plan remains appropriate;Frequency remains appropriate    Co-evaluation                 AM-PAC OT "6 Clicks" Daily Activity     Outcome Measure   Help from another person eating meals?: None Help from another person taking care of personal grooming?: A Little Help from another person toileting, which includes using toliet, bedpan, or urinal?: A Little Help from another person bathing (including washing, rinsing, drying)?: A Little Help from another person to put on and taking off regular upper body clothing?: A Little Help from another person to put on and taking off regular lower body clothing?: A Little 6 Click Score: 19    End of Session Equipment Utilized During Treatment: Rolling walker;Gait belt;Other (comment) (post op shoe)  OT Visit Diagnosis: Other abnormalities of gait and mobility (R26.89);Muscle weakness (generalized) (M62.81);Pain   Activity Tolerance Patient tolerated treatment well   Patient Left in chair;with call bell/phone within reach   Nurse Communication Mobility status        Time: 6314-9702 OT Time Calculation (min): 20 min  Charges: OT General Charges $OT Visit: 1 Visit OT Treatments $Self Care/Home Management : 8-22 mins  Jolaine Artist, OT Acute Rehabilitation Services Pager [phone] Office [place] 06/30/2020, 10:20 AM

## 2020-06-30 NOTE — Care Management Important Message (Signed)
Important Message  Patient Details  Name: Rebekah Bridges MRN: [id number] Date of Birth: 07-Jan-1916   Medicare Important Message Given:  Yes   Patient left prior to IM delivery document mailed to the patient home address.  Danuel Felicetti 06/30/2020, 2:09 PM

## 2020-06-30 NOTE — TOC Transition Note (Signed)
Transition of Care (TOC) - CM/SW Discharge Note Donn Pierini RN, BSN Transitions of Care Unit 4E- RN Case Manager See Treatment Team for direct phone #    Patient Details  Name: Rebekah Bridges MRN: [id number] Date of Birth: 15-Oct-1915  Transition of Care [facility]) CM/SW Contact:  Darrold Span, RN Phone Number: 06/30/2020, 10:22 AM   Clinical Narrative:    Pt stable for transition home today, Spoke with daughter [name] to let her know pt has been discharged, per [name] she has arranged for family friend to come transport pt home- Broadus John will be here to pick pt up around 1pm.  TOC to fill meds for pt and deliver to bedside- daughter states to have TOC call her and she will take care of payment over the phone.   Spoke with Alinda Money at [facility] pharmacy who will contact daughter regarding meds, then deliver to bedside to send home with pt.  Bedside RN updated.   [facility] has been notified of discharge for start of care follow up.    Final next level of care: Home w Home Health Services Barriers to Discharge: Barriers Resolved   Patient Goals and CMS Choice Patient states their goals for this hospitalization and ongoing recovery are:: return home CMS Medicare.gov Compare Post Acute Care list provided to:: Patient Choice offered to / list presented to : Patient,Adult Children  Discharge Placement                  Home with [facility]      Discharge Plan and Services   Discharge Planning Services: CM Consult Post Acute Care Choice: Home Health          DME Arranged: N/A DME Agency: NA       HH Arranged: PT,OT HH Agency: [facility] Date HH Agency Contacted: 06/29/20 Time HH Agency Contacted: 1130 Representative spoke with at [facility] Agency: Pearson Grippe  Social Determinants of Health (SDOH) Interventions     Readmission Risk Interventions Readmission Risk Prevention Plan 06/30/2020  Post Dischage Appt Complete  Medication Screening Complete  Transportation  Screening Complete  Some recent data might be hidden

## 2020-07-01 ENCOUNTER — Telehealth: Payer: Self-pay | Admitting: Podiatry

## 2020-07-01 NOTE — Telephone Encounter (Signed)
I called the patient's daughter to discuss her lab results and pathology from surgery and see how she is doing.  Voicemail was full and could not leave message.  We will discuss at her next visit with me next week

## 2020-07-02 ENCOUNTER — Observation Stay (HOSPITAL_COMMUNITY)
Admission: EM | Admit: 2020-07-02 | Discharge: 2020-07-03 | Disposition: A | Discharge status: Home / Self Care | Payer: Medicare Other | Attending: Vascular Surgery | Admitting: Vascular Surgery

## 2020-07-02 ENCOUNTER — Encounter (HOSPITAL_COMMUNITY): Payer: Self-pay

## 2020-07-02 ENCOUNTER — Other Ambulatory Visit: Payer: Self-pay

## 2020-07-02 DIAGNOSIS — I739 Peripheral vascular disease, unspecified: Secondary | ICD-10-CM | POA: Diagnosis not present

## 2020-07-02 DIAGNOSIS — Z20822 Contact with and (suspected) exposure to covid-19: Secondary | ICD-10-CM | POA: Insufficient documentation

## 2020-07-02 DIAGNOSIS — Z7982 Long term (current) use of aspirin: Secondary | ICD-10-CM | POA: Diagnosis not present

## 2020-07-02 DIAGNOSIS — I480 Paroxysmal atrial fibrillation: Secondary | ICD-10-CM | POA: Diagnosis not present

## 2020-07-02 DIAGNOSIS — Z79899 Other long term (current) drug therapy: Secondary | ICD-10-CM | POA: Insufficient documentation

## 2020-07-02 DIAGNOSIS — I10 Essential (primary) hypertension: Secondary | ICD-10-CM | POA: Diagnosis not present

## 2020-07-02 DIAGNOSIS — R2243 Localized swelling, mass and lump, lower limb, bilateral: Secondary | ICD-10-CM | POA: Diagnosis present

## 2020-07-02 LAB — BASIC METABOLIC PANEL
Anion gap: 7 (ref 5–15)
BUN: 18 mg/dL (ref 8–23)
CO2: 27 mmol/L (ref 22–32)
Calcium: 8.6 mg/dL — ABNORMAL LOW (ref 8.9–10.3)
Chloride: 100 mmol/L (ref 98–111)
Creatinine, Ser: 0.67 mg/dL (ref 0.44–1.00)
GFR, Estimated: >60 mL/min (ref >60)
Glucose, Bld: 109 mg/dL — ABNORMAL HIGH (ref 70–99)
Potassium: 4 mmol/L (ref 3.5–5.1)
Sodium: 134 mmol/L — ABNORMAL LOW (ref 135–145)

## 2020-07-02 LAB — CBC WITH DIFFERENTIAL/PLATELET
Abs Immature Granulocytes: 0.3 10*3/uL — ABNORMAL HIGH (ref 0.00–0.07)
Basophils Absolute: 0 10*3/uL (ref 0.0–0.1)
Basophils Relative: 1 %
Eosinophils Absolute: 0 10*3/uL (ref 0.0–0.5)
Eosinophils Relative: 1 %
HCT: 31.5 % — ABNORMAL LOW (ref 36.0–46.0)
Hemoglobin: 9.8 g/dL — ABNORMAL LOW (ref 12.0–15.0)
Immature Granulocytes: 7 %
Lymphocytes Relative: 26 %
Lymphs Abs: 1.1 10*3/uL (ref 0.7–4.0)
MCH: 31.2 pg (ref 26.0–34.0)
MCHC: 31.1 g/dL (ref 30.0–36.0)
MCV: 100.3 fL — ABNORMAL HIGH (ref 80.0–100.0)
Monocytes Absolute: 0.4 10*3/uL (ref 0.1–1.0)
Monocytes Relative: 10 %
Neutro Abs: 2.4 10*3/uL (ref 1.7–7.7)
Neutrophils Relative %: 55 %
Platelets: 165 10*3/uL (ref 150–400)
RBC: 3.14 MIL/uL — ABNORMAL LOW (ref 3.87–5.11)
RDW: 14.1 % (ref 11.5–15.5)
WBC: 4.2 10*3/uL (ref 4.0–10.5)
nRBC: 0 % (ref 0.0–0.2)

## 2020-07-02 LAB — LACTIC ACID, PLASMA: Lactic Acid, Venous: 2.4 mmol/L (ref 0.5–1.9)

## 2020-07-02 MED ORDER — ACETAMINOPHEN 325 MG PO TABS
325.0000 mg | ORAL_TABLET | ORAL | Status: DC | PRN
Start: 2020-07-02 — End: 2020-07-03

## 2020-07-02 MED ORDER — HYDRALAZINE HCL 20 MG/ML IJ SOLN: 5.0000 mg | INTRAMUSCULAR | Status: DC | PRN

## 2020-07-02 MED ORDER — METOPROLOL TARTRATE 25 MG PO TABS
25.0000 mg | ORAL_TABLET | Freq: Two times a day (BID) | ORAL | Status: DC
  Administered 2020-07-02 – 2020-07-03 (×2): 25 mg via ORAL
  Filled 2020-07-02: qty 1

## 2020-07-02 MED ORDER — PANTOPRAZOLE SODIUM 40 MG PO TBEC
40.0000 mg | DELAYED_RELEASE_TABLET | Freq: Every day | ORAL | Status: DC
  Administered 2020-07-03: 40 mg via ORAL
  Filled 2020-07-02: qty 1

## 2020-07-02 MED ORDER — ASPIRIN EC 81 MG PO TBEC
81.0000 mg | DELAYED_RELEASE_TABLET | Freq: Every day | ORAL | Status: DC
  Administered 2020-07-03: 81 mg via ORAL
  Filled 2020-07-02: qty 1

## 2020-07-02 MED ORDER — ALUM & MAG HYDROXIDE-SIMETH 200-200-20 MG/5ML PO SUSP: 15.0000 mL | ORAL | Status: DC | PRN

## 2020-07-02 MED ORDER — VITAMIN D 25 MCG (1000 UNIT) PO TABS
1000.0000 [IU] | ORAL_TABLET | Freq: Every day | ORAL | Status: DC
  Administered 2020-07-03: 1000 [IU] via ORAL
  Filled 2020-07-02: qty 1

## 2020-07-02 MED ORDER — BETA CAROTENE 25000 UNITS PO CAPS: 25000.0000 [IU] | ORAL_CAPSULE | Freq: Every day | ORAL | Status: DC

## 2020-07-02 MED ORDER — SODIUM CHLORIDE 0.9 % IV SOLN: INTRAVENOUS | Status: DC

## 2020-07-02 MED ORDER — GUAIFENESIN-DM 100-10 MG/5ML PO SYRP: 15.0000 mL | ORAL_SOLUTION | ORAL | Status: DC | PRN

## 2020-07-02 MED ORDER — CALCIUM CARBONATE ANTACID 500 MG PO CHEW: 1.0000 | CHEWABLE_TABLET | Freq: Three times a day (TID) | ORAL | Status: DC | PRN

## 2020-07-02 MED ORDER — OXYCODONE-ACETAMINOPHEN 5-325 MG PO TABS: 1.0000 | ORAL_TABLET | ORAL | Status: DC | PRN

## 2020-07-02 MED ORDER — POTASSIUM CHLORIDE CRYS ER 20 MEQ PO TBCR
20.0000 meq | EXTENDED_RELEASE_TABLET | Freq: Once | ORAL | Status: AC
  Administered 2020-07-02: 20 meq via ORAL
  Filled 2020-07-02: qty 1

## 2020-07-02 MED ORDER — ONDANSETRON HCL 4 MG/2ML IJ SOLN: 4.0000 mg | Freq: Four times a day (QID) | INTRAMUSCULAR | Status: DC | PRN

## 2020-07-02 MED ORDER — ADULT MULTIVITAMIN W/MINERALS CH
1.0000 | ORAL_TABLET | Freq: Every day | ORAL | Status: DC
  Administered 2020-07-02: 1 via ORAL
  Filled 2020-07-02: qty 1

## 2020-07-02 MED ORDER — PHENOL 1.4 % MT LIQD: 1.0000 | OROMUCOSAL | Status: DC | PRN

## 2020-07-02 MED ORDER — METOPROLOL TARTRATE 5 MG/5ML IV SOLN: 2.0000 mg | INTRAVENOUS | Status: DC | PRN

## 2020-07-02 MED ORDER — LABETALOL HCL 5 MG/ML IV SOLN: 10.0000 mg | INTRAVENOUS | Status: DC | PRN

## 2020-07-02 MED ORDER — ACETAMINOPHEN 325 MG RE SUPP
325.0000 mg | RECTAL | Status: DC | PRN
  Filled 2020-07-02: qty 2

## 2020-07-02 MED ORDER — ENOXAPARIN SODIUM 60 MG/0.6ML IJ SOSY
1.0000 mg/kg | PREFILLED_SYRINGE | Freq: Once | INTRAMUSCULAR | Status: AC
  Administered 2020-07-03: 52.5 mg via SUBCUTANEOUS
  Filled 2020-07-02: qty 0.53

## 2020-07-02 MED ORDER — ENOXAPARIN SODIUM 60 MG/0.6ML IJ SOSY: 1.0000 mg/kg | PREFILLED_SYRINGE | INTRAMUSCULAR | Status: DC

## 2020-07-02 NOTE — Progress Notes (Signed)
ANTICOAGULATION CONSULT NOTE - Initial Consult  Pharmacy Consult for Lovenox Indication: r/o DVT  Allergies  Allergen Reactions  . Hydrocodone Nausea Only  . Meloxicam Nausea Only    Upset stomach  . Other Other (See Comments)  . Tramadol Hcl Nausea Only and Other (See Comments)    sweating  . Penicillins Nausea Only and Rash  . Sulfa Antibiotics Nausea Only, Hives and Rash    And rash    Patient Measurements: Height: 4\' 11"  (149.9 cm) Weight: 53.5 kg (117 lb 15.1 oz) IBW/kg (Calculated) : 43.2  Vital Signs: Temp: 98.5 F (36.9 C) (05/14 2008) Temp Source: Oral (05/14 2008) BP: 179/78 (05/14 2300) Pulse Rate: 72 (05/14 2300)  Labs: Recent Labs    07/02/20 2108  HGB 9.8*  HCT 31.5*  PLT 165  CREATININE 0.67    Estimated Creatinine Clearance: 25.8 mL/min (by C-G formula based on SCr of 0.67 mg/dL).   Medical History: Past Medical History:  Diagnosis Date  . Osteoporosis    s/p vertebroplasty for fracutres x 3  . PAF (paroxysmal atrial fibrillation) (HCC) 07/17/2019    Assessment: 87yo female admitted with concern for DVT given acute onset right lower extremity edema after right femoropopliteal bypass with vein, to begin LMWH during w/u.  Goal of Therapy:  Anti-Xa level 0.6-1 units/ml 4hrs after LMWH dose given Monitor platelets by anticoagulation protocol: Yes   Plan:  Lovenox 1mg /kg Q24H. Monitor CBC.  87yo, PharmD, BCPS  07/02/2020,11:41 PM

## 2020-07-02 NOTE — ED Triage Notes (Addendum)
Pt had venous bypass in right leg 5 days ago. Pt has swelling and blistering on right leg. Pt's family called surgeon on call, states they were told to come to ED to r/o blood clot. Pt denies pain.  Pt's family states they spoke w/[facility] Chestine [facility]. They told her to come to ED to be admitted for doppler study in am.

## 2020-07-02 NOTE — ED Notes (Signed)
LA 2.4 per lab tech at this time

## 2020-07-02 NOTE — H&P (Addendum)
H+P    Reason for Consult: Right lower extremity edema Referring Physician: Dr. Particia Nearing MRN #:  [id number]  History of Present Illness: This is a 87 y.o. female underwent right femoral to above-knee popliteal artery bypass 5 days ago for wound.  She states that today she began having swelling in her right lower extremity with blistering around the ankle.  She actually walked with PT in the morning did not have any swelling prior to this.  She now has weeping from the above-knee incision as well as the lateral ankle.  States that the foot is warm she has not had any fevers or chills.  Family is also concerned with her incision on her foot.  Patient does not have any significant pain at this time she denies any previous history of DVT.  She is currently on aspirin.  Past Medical History:  Diagnosis Date  . Osteoporosis    s/p vertebroplasty for fracutres x 3  . PAF (paroxysmal atrial fibrillation) (HCC) 07/17/2019    Past Surgical History:  Procedure Laterality Date  . ABDOMINAL AORTOGRAM W/LOWER EXTREMITY N/A 05/26/2020   Procedure: ABDOMINAL AORTOGRAM W/LOWER EXTREMITY;  Surgeon: Cephus Shelling, MD;  Location: [facility] INVASIVE CV LAB;  Service: Cardiovascular;  Laterality: N/A;  . BLADDER AUGMENTATION  1964  . FEMORAL-POPLITEAL BYPASS GRAFT Right 06/27/2020   Procedure: RIGHT COOMMON FEMORAL TO ABOVE KNEE POPLITEAL ARTERY BYPASS GRAFT USING RIGHT GREATER SAPHENOUS VEIN;  Surgeon: Cephus Shelling, MD;  Location: [facility] OR;  Service: Vascular;  Laterality: Right;  . SPINE SURGERY  2010  . SPINE SURGERY  2011  . SPINE SURGERY  2012  . WOUND DEBRIDEMENT Right 06/27/2020   Procedure: RIGHT FOOT PARTIAL RESECTION FIRST METATARSAL/ PHALANGEAL JOINT, IMPLANT NON-BIOLOGICAL DRUG IMPLANT;  Surgeon: Edwin Cap, DPM;  Location: [facility] OR;  Service: Podiatry;  Laterality: Right;    Allergies  Allergen Reactions  . Hydrocodone Nausea Only  . Meloxicam Nausea Only    Upset stomach  . Other  Other (See Comments)  . Tramadol Hcl Nausea Only and Other (See Comments)    sweating  . Penicillins Nausea Only and Rash  . Sulfa Antibiotics Nausea Only, Hives and Rash    And rash    Prior to Admission medications   Medication Sig Start Date End Date Taking? Authorizing Provider  aspirin 81 MG EC tablet Take 1 tablet (81 mg total) by mouth daily. Swallow whole. 06/30/20   Setzer, Lynnell Jude, PA-C  beta carotene 79892 UNIT capsule Take 25,000 Units by mouth at bedtime.    [provider]  cadexomer iodine (IODOSORB) 0.9 % gel Apply 1 application topically daily as needed for wound care. Patient taking differently: Apply 1 application topically in the morning. With wound dressing 06/01/20   McDonald, Rachelle Hora, DPM  calcium carbonate (TUMS - DOSED IN MG ELEMENTAL CALCIUM) 500 MG chewable tablet Chew 1 tablet by mouth 3 (three) times daily as needed for indigestion or heartburn.    [provider]  cholecalciferol (VITAMIN D) 25 MCG (1000 UNIT) tablet Take 1,000 Units by mouth at bedtime.    [provider]  ibuprofen (ADVIL) 200 MG tablet Take 200 mg by mouth every 8 (eight) hours as needed (back/knee pain).    [provider]  metoprolol tartrate (LOPRESSOR) 25 MG tablet Take 1 tablet (25 mg total) by mouth 2 (two) times daily. 06/30/20   Setzer, Lynnell Jude, PA-C  Multiple Vitamin (MULTIVITAMIN WITH MINERALS) TABS tablet Take 1 tablet by mouth at  bedtime. One-A-Day Multivitamin    [provider]  oxyCODONE-acetaminophen (PERCOCET) 5-325 MG tablet Take 1 tablet by mouth every 4 (four) hours as needed for severe pain. 06/30/20 06/30/21  Setzer, Lynnell Jude, PA-C  RED YEAST RICE EXTRACT PO Take 1,200 mg by mouth at bedtime.    [provider]    Social History   Socioeconomic History  . Marital status: Widowed    Spouse name: Not on file  . Number of children: Not on file  . Years of education: Not on file  . Highest education level: Not on file   Occupational History  . Not on file  Tobacco Use  . Smoking status: Never Smoker  . Smokeless tobacco: Never Used  Vaping Use  . Vaping Use: Never used  Substance and Sexual Activity  . Alcohol use: Not Currently  . Drug use: No  . Sexual activity: Not on file  Other Topics Concern  . Not on file  Social History Narrative  . Not on file   Social Determinants of Health   Financial Resource Strain: Not on file  Food Insecurity: Not on file  Transportation Needs: Not on file  Physical Activity: Not on file  Stress: Not on file  Social Connections: Not on file  Intimate Partner Violence: Not on file    Family History  Problem Relation Age of Onset  . Cancer Father   . Learning disabilities Paternal Grandmother   . Cancer Sister   . Cancer Brother     ROS: Cardiovascular: []  chest pain/pressure []  palpitations []  SOB lying flat []  DOE []  pain in legs while walking []  pain in legs at rest []  pain in legs at night []  non-healing ulcers []  hx of DVT [x]  swelling in legs  Pulmonary: []  productive cough []  asthma/wheezing []  home O2  Neurologic: []  weakness in []  arms []  legs []  numbness in []  arms []  legs []  hx of CVA []  mini stroke [] difficulty speaking or slurred speech []  temporary loss of vision in one eye []  dizziness  Hematologic: []  hx of cancer []  bleeding problems []  problems with blood clotting easily  Endocrine:   []  diabetes []  thyroid disease  GI []  vomiting blood []  blood in stool  GU: []  CKD/renal failure []  HD--[]  M/W/F or []  T/T/S []  burning with urination []  blood in urine  Psychiatric: []  anxiety []  depression  Musculoskeletal: []  arthritis []  joint pain  Integumentary: []  rashes [x]  blisters  Constitutional: []  fever []  chills   Physical Examination  Vitals:   07/02/20 2200 07/02/20 2230  BP: (!) 184/82 (!) 173/94  Pulse: 69 72  Resp: 13 20  Temp:    SpO2: 98% 100%   There is no height or weight on  file to calculate BMI.  General:  nad HENT: WNL, normocephalic Pulmonary: normal non-labored breathing Cardiac: Strong AT and peroneal signals right ankle Abdomen:  soft, NT/ND, no masses Extremities: Incisions are all intact There is swelling of the right lower extremity particularly from the knee down that is nonpitting in nature There is drainage from the lateral skin split Foot wound on the right appears to be healing well with sutures in place Neurologic: A&O X 3   CBC    Component Value Date/Time   WBC 4.2 07/02/2020 2108   RBC 3.14 (L) 07/02/2020 2108   HGB 9.8 (L) 07/02/2020 2108   HCT 31.5 (L) 07/02/2020 2108   PLT 165 07/02/2020 2108   MCV 100.3 (H) 07/02/2020 2108  MCH 31.2 07/02/2020 2108   MCHC 31.1 07/02/2020 2108   RDW 14.1 07/02/2020 2108   LYMPHSABS PENDING 07/02/2020 2108   MONOABS PENDING 07/02/2020 2108   EOSABS PENDING 07/02/2020 2108   BASOSABS PENDING 07/02/2020 2108    BMET    Component Value Date/Time   NA 134 (L) 07/02/2020 2108   K 4.0 07/02/2020 2108   CL 100 07/02/2020 2108   CO2 27 07/02/2020 2108   GLUCOSE 109 (H) 07/02/2020 2108   BUN 18 07/02/2020 2108   BUN 26 (H) 10/27/2012 0910   CREATININE 0.67 07/02/2020 2108   CREATININE 0.68 10/27/2012 0910   CALCIUM 8.6 (L) 07/02/2020 2108   GFRNONAA >60 07/02/2020 2108   GFRNONAA >60 10/27/2012 0910   GFRAA >60 07/19/2019 0443   GFRAA >60 10/27/2012 0910    COAGS: Lab Results  Component Value Date   INR 1.1 06/27/2020   INR 1.01 02/23/2011   INR 1.02 02/24/2010     Non-Invasive Vascular Imaging:   No new studies   ASSESSMENT/PLAN: This is a 87 y.o. female presents for concern of acute onset right lower extremity edema after right femoropopliteal bypass with vein.  Swelling really appears to be expected postoperative however given the acute onset today concern is certainly for DVT.    Plan will be to admit her to the hospital check duplex in the morning and treat accordingly.   We will initiate therapeutic Lovenox tonight given high index of suspicion.  This plan has been communicated with the patient and her daughter-in-law and they demonstrate good understanding.  Clayvon Parlett C. Randie Heinz, MD [facility] [place] Office: [phone] Pager: [phone]

## 2020-07-02 NOTE — ED Provider Notes (Signed)
[facility] EMERGENCY DEPARTMENT Provider Note   CSN: [id number] Arrival date & time: 07/02/20  1939     History Chief Complaint  Patient presents with  . Follow-up    Rebekah Bridges is a 87 y.o. female.  HPI presents with postop leg swelling.  She had a femoropopliteal bypass of her right leg 5 days ago after being discovered to have critical stenosis putting her at risk for limb ischemia.  Her postop course is overall so far been uneventful with improved pain however today her leg swelling worsened.  She has not had any fever at home, no purulent discharge from her wound sites although her daughter-in-law notes that she has a clearish yellowish drainage coming from lower in her leg.  She also had a foot surgery by the podiatrist for which she plans to follow-up next week.  She denies significant pain today.  The daughter-in-law states that they called the vascular surgery doctor who recommended she come to the ER to get admitted for an ultrasound to look for blood clot in the morning.  No falls or injuries to the leg.     Past Medical History:  Diagnosis Date  . Osteoporosis    s/p vertebroplasty for fracutres x 3  . PAF (paroxysmal atrial fibrillation) (HCC) 07/17/2019    Patient Active Problem List   Diagnosis Date Noted  . 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 Hallux valgus with bunions of right foot   . Acute pancreatitis 07/17/2019  . Unspecified atrial fibrillation (HCC) 07/17/2019  . Peripheral polyneuropathy 01/23/2019  . Bilateral sacroiliitis (HCC) 11/05/2018  . Body mass index (BMI) 26.0-26.9, adult 11/05/2018  . Elevated blood-pressure reading, without diagnosis of hypertension 11/05/2018  . Chronic low back pain without sciatica 01/07/2015  . Degeneration of lumbar intervertebral disc 03/16/2014  . Spinal stenosis of lumbar region 03/03/2014  . Essential hypertension 02/06/2014  . Back pain 01/30/2014  .  Neuropathy 01/30/2014  . PAD (peripheral artery disease) (HCC) 08/28/2012  . History of headache 08/28/2012  . HYPERLIPIDEMIA 01/24/2010  . OSTEOARTHRITIS 01/24/2010  . BACK PAIN 01/24/2010  . OSTEOPOROSIS 01/24/2010    Past Surgical History:  Procedure Laterality Date  . ABDOMINAL AORTOGRAM W/LOWER EXTREMITY N/A 05/26/2020   Procedure: ABDOMINAL AORTOGRAM W/LOWER EXTREMITY;  Surgeon: Cephus Shelling, MD;  Location: [facility] INVASIVE CV LAB;  Service: Cardiovascular;  Laterality: N/A;  . BLADDER AUGMENTATION  1964  . FEMORAL-POPLITEAL BYPASS GRAFT Right 06/27/2020   Procedure: RIGHT COOMMON FEMORAL TO ABOVE KNEE POPLITEAL ARTERY BYPASS GRAFT USING RIGHT GREATER SAPHENOUS VEIN;  Surgeon: Cephus Shelling, MD;  Location: [facility] OR;  Service: Vascular;  Laterality: Right;  . SPINE SURGERY  2010  . SPINE SURGERY  2011  . SPINE SURGERY  2012  . WOUND DEBRIDEMENT Right 06/27/2020   Procedure: RIGHT FOOT PARTIAL RESECTION FIRST METATARSAL/ PHALANGEAL JOINT, IMPLANT NON-BIOLOGICAL DRUG IMPLANT;  Surgeon: Edwin Cap, DPM;  Location: [facility] OR;  Service: Podiatry;  Laterality: Right;     OB History   No obstetric history on file.     Family History  Problem Relation Age of Onset  . Cancer Father   . Learning disabilities Paternal Grandmother   . Cancer Sister   . Cancer Brother     Social History   Tobacco Use  . Smoking status: Never Smoker  . Smokeless tobacco: Never Used  Vaping Use  . Vaping Use: Never used  Substance Use Topics  . Alcohol  use: Not Currently  . Drug use: No    Home Medications Prior to Admission medications   Medication Sig Start Date End Date Taking? Authorizing Provider  aspirin 81 MG EC tablet Take 1 tablet (81 mg total) by mouth daily. Swallow whole. 06/30/20   Setzer, Lynnell Jude, PA-C  beta carotene 74259 UNIT capsule Take 25,000 Units by mouth at bedtime.    [provider]  cadexomer iodine (IODOSORB) 0.9 % gel Apply 1 application topically  daily as needed for wound care. Patient taking differently: Apply 1 application topically in the morning. With wound dressing 06/01/20   McDonald, Rachelle Hora, DPM  calcium carbonate (TUMS - DOSED IN MG ELEMENTAL CALCIUM) 500 MG chewable tablet Chew 1 tablet by mouth 3 (three) times daily as needed for indigestion or heartburn.    [provider]  cholecalciferol (VITAMIN D) 25 MCG (1000 UNIT) tablet Take 1,000 Units by mouth at bedtime.    [provider]  ibuprofen (ADVIL) 200 MG tablet Take 200 mg by mouth every 8 (eight) hours as needed (back/knee pain).    [provider]  metoprolol tartrate (LOPRESSOR) 25 MG tablet Take 1 tablet (25 mg total) by mouth 2 (two) times daily. 06/30/20   Setzer, Lynnell Jude, PA-C  Multiple Vitamin (MULTIVITAMIN WITH MINERALS) TABS tablet Take 1 tablet by mouth at bedtime. One-A-Day Multivitamin    [provider]  oxyCODONE-acetaminophen (PERCOCET) 5-325 MG tablet Take 1 tablet by mouth every 4 (four) hours as needed for severe pain. 06/30/20 06/30/21  Setzer, Lynnell Jude, PA-C  RED YEAST RICE EXTRACT PO Take 1,200 mg by mouth at bedtime.    [provider]    Allergies    Hydrocodone, Meloxicam, Other, Tramadol hcl, Penicillins, and Sulfa antibiotics  Review of Systems   Review of Systems  Constitutional: Negative for chills and fever.  HENT: Negative for ear pain and sore throat.   Eyes: Negative for pain and visual disturbance.  Respiratory: Negative for cough and shortness of breath.   Cardiovascular: Negative for chest pain and palpitations.  Gastrointestinal: Negative for abdominal pain and vomiting.  Genitourinary: Negative for dysuria and hematuria.  Musculoskeletal: Negative for arthralgias and back pain.  Skin: Negative for color change and rash.  Neurological: Negative for seizures and syncope.  All other systems reviewed and are negative.   Physical Exam Updated Vital Signs BP (!) 173/94   Pulse 72   Temp  98.5 F (36.9 C) (Oral)   Resp 20   SpO2 100%   Physical Exam Vitals and nursing note reviewed.  Constitutional:      General: She is not in acute distress.    Appearance: She is well-developed.  HENT:     Head: Normocephalic and atraumatic.  Eyes:     Conjunctiva/sclera: Conjunctivae normal.  Cardiovascular:     Rate and Rhythm: Normal rate and regular rhythm.     Heart sounds: No murmur heard.   Pulmonary:     Effort: Pulmonary effort is normal. No respiratory distress.     Breath sounds: Normal breath sounds.  Abdominal:     Palpations: Abdomen is soft.     Tenderness: There is no abdominal tenderness.  Musculoskeletal:        General: No deformity or signs of injury.     Cervical back: Neck supple.     Comments: Right lower extremity edema is present from the distal tib-fib to the proximal thigh.  Suture lines are well approximated without significant drainage, no  surrounding erythema or purulence.  Range of motion of the hip and knee are intact without significant pain.  Dopplerable dorsalis pedis and posterior tibial pulses.  Cap refill less than 2 seconds distally patient has light touch sensation intact.  Minimal crepitus around suture line in the knee.  Skin:    General: Skin is warm and dry.     Capillary Refill: Capillary refill takes less than 2 seconds.     Comments: There is some superficial skin breakdown at the distal tib-fib without open wound but there is serous drainage from this area  Neurological:     Mental Status: She is alert.     Sensory: No sensory deficit.     Motor: No weakness.  Psychiatric:        Thought Content: Thought content normal.        Judgment: Judgment normal.     ED Results / Procedures / Treatments   Labs (all labs ordered are listed, but only abnormal results are displayed) Labs Reviewed  CBC WITH DIFFERENTIAL/PLATELET - Abnormal; Notable for the following components:      Result Value   RBC 3.14 (*)    Hemoglobin 9.8 (*)     HCT 31.5 (*)    MCV 100.3 (*)    Abs Immature Granulocytes 0.30 (*)    All other components within normal limits  BASIC METABOLIC PANEL - Abnormal; Notable for the following components:   Sodium 134 (*)    Glucose, Bld 109 (*)    Calcium 8.6 (*)    All other components within normal limits  LACTIC ACID, PLASMA  LACTIC ACID, PLASMA    EKG None  Radiology No results found.  Procedures Procedures   Medications Ordered in ED Medications - No data to display  ED Course  I have reviewed the triage vital signs and the nursing notes.  Pertinent labs & imaging results that were available during my care of the patient were reviewed by me and considered in my medical decision making (see chart for details).    MDM Rules/Calculators/A&P                          Patient presents with postop swelling.  Concern for DVT in the context of restricted mobility and duration since surgery.  Suture lines appear well-healed without infectious findings and although there is minimal crepitus in a small area around the knee this is likely postoperative.  Will obtain labs to screen for other signs of infection.  Will discuss with vascular surgery given the daughter-in-law's report.  D/w vascular surgeon Randie Heinz who agreed to come evaluate. Labs show no leukocytosis. BMP overall unremarkable no focal emergent changes. Vascular surgeon evaluated, recommends admission to his service. No further ER management per him.  Final Clinical Impression(s) / ED Diagnoses Final diagnoses:  None    Rx / DC Orders ED Discharge Orders    None       Jacklynn Bue, MD 07/02/20 4034    Jacalyn Lefevre, MD 07/02/20 2327

## 2020-07-03 ENCOUNTER — Other Ambulatory Visit: Payer: Self-pay

## 2020-07-03 ENCOUNTER — Observation Stay (HOSPITAL_BASED_OUTPATIENT_CLINIC_OR_DEPARTMENT_OTHER): Payer: Medicare Other

## 2020-07-03 DIAGNOSIS — M79661 Pain in right lower leg: Secondary | ICD-10-CM

## 2020-07-03 DIAGNOSIS — Z9889 Other specified postprocedural states: Secondary | ICD-10-CM | POA: Diagnosis not present

## 2020-07-03 LAB — CBC
HCT: 29.4 % — ABNORMAL LOW (ref 36.0–46.0)
Hemoglobin: 9.4 g/dL — ABNORMAL LOW (ref 12.0–15.0)
MCH: 31.3 pg (ref 26.0–34.0)
MCHC: 32 g/dL (ref 30.0–36.0)
MCV: 98 fL (ref 80.0–100.0)
Platelets: 186 10*3/uL (ref 150–400)
RBC: 3 MIL/uL — ABNORMAL LOW (ref 3.87–5.11)
RDW: 14.1 % (ref 11.5–15.5)
WBC: 4 10*3/uL (ref 4.0–10.5)
nRBC: 0 % (ref 0.0–0.2)

## 2020-07-03 LAB — AEROBIC/ANAEROBIC CULTURE W GRAM STAIN (SURGICAL/DEEP WOUND): Gram Stain: NONE SEEN

## 2020-07-03 LAB — SARS CORONAVIRUS 2 (TAT 6-24 HRS): SARS Coronavirus 2: NEGATIVE

## 2020-07-03 LAB — LACTIC ACID, PLASMA: Lactic Acid, Venous: 1.2 mmol/L (ref 0.5–1.9)

## 2020-07-03 NOTE — Progress Notes (Addendum)
[facility] [place]  Subjective  - No new complaints, wearing ted hose.   Objective (!) 113/55 69 98 F (36.7 C) (Oral) 14 96%  Intake/Output Summary (Last 24 hours) at 07/03/2020 0747 Last data filed at 07/03/2020 0600 Gross per 24 hour  Intake 563.28 ml  Output --  Net 563.28 ml    Right LE with good doppler signals.  Motor and sensation intact right LE. Incisions healing well Right groin soft without hematoma Dressing on right lower leg clean and dry Mild edema right LE  Lungs non labored breathing   Assessment/Planning: S/P right femoropopliteal bypass with vein Inflow bypass patent with good doppler signals right LE  New right LE edema We have ordered a DVT study.  She is on Lovenox therapeutically until we review the study.  Ted hose in place and elevation.    Mosetta Pigeon 07/03/2020 7:47 AM --  Laboratory Lab Results: Recent Labs    07/02/20 2108 07/03/20 0445  WBC 4.2 4.0  HGB 9.8* 9.4*  HCT 31.5* 29.4*  PLT 165 186   BMET Recent Labs    07/02/20 2108  NA 134*  K 4.0  CL 100  CO2 27  GLUCOSE 109*  BUN 18  CREATININE 0.67  CALCIUM 8.6*    COAG Lab Results  Component Value Date   INR 1.1 06/27/2020   INR 1.01 02/23/2011   INR 1.02 02/24/2010   No results found for: PTT  I have independently interviewed and examined patient and agree with PA assessment and plan above.  Swelling improved with TED hose and bedrest.  We will check DVT study this morning.  Larrie Fraizer C. Randie Heinz, MD [facility] [place] Office: [phone] Pager: [phone]   Addendum: No evidence of DVT on ultrasound.  Okay for discharge today.  She will keep her regularly scheduled follow-up with Dr. Chestine Spore.  Lemar Livings, MD

## 2020-07-03 NOTE — Progress Notes (Signed)
Patient discharged home.  AVS given with patient education provided.  Peripheral IV removed and telemetry discontinued. Patient transported by wheelchair to [facility].

## 2020-07-03 NOTE — Progress Notes (Signed)
Pt transferred from ED to 4E04,  alert and oriented x 4, hemodynamics stable, on room air, no distress at arrival. Right lower leg incision apparenatly had weeping serous drainage. Dry gauze dressing changed. Right foot with suture had minimal drainage. Dry gauze dressing applied. Pt denied pain. She was able to ambulate with walker and one assisted.   CHG wipe given, call bell within reached, room oriented.  No acute distress. We continue to monitor.  Filiberto Pinks, RN

## 2020-07-03 NOTE — Progress Notes (Signed)
VASCULAR LAB    Right lower extremity venous duplex has been performed.  See CV proc for preliminary results.   Kandie Keiper, RVT 07/03/2020, 11:33 AM

## 2020-07-05 NOTE — Discharge Summary (Signed)
[facility] Discharge Summary   Patient ID:  Rebekah Bridges MRN: [id number] DOB/AGE: 1916-02-05 87 y.o.  Admit date: 07/02/2020 Discharge date: 07/03/20 Date of Surgery: * No surgery found * Surgeon: * Surgery not found *  Admission Diagnosis: PAD (peripheral artery disease) (HCC) [I73.9]  Discharge Diagnoses:  PAD (peripheral artery disease) (HCC) [I73.9]  Secondary Diagnoses: Past Medical History:  Diagnosis Date  . Osteoporosis    s/p vertebroplasty for fracutres x 3  . PAF (paroxysmal atrial fibrillation) (HCC) 07/17/2019      Discharged Condition: stable  HPI: This is a 87 y.o. female underwent right femoral to above-knee popliteal artery bypass 5 days ago for wound.  She states that today she began having swelling in her right lower extremity with blistering around the ankle.  She actually walked with PT in the morning did not have any swelling prior to this.  She now has weeping from the above-knee incision as well as the lateral ankle.  States that the foot is warm she has not had any fevers or chills.  Family is also concerned with her incision on her foot.  Patient does not have any significant pain at this time she denies any previous history of DVT.  She is currently on aspirin.   Placed in patient with ted hose and elevation DVT study ordered for right LE edema.   We will initiate therapeutic Lovenox was given high index of suspicion. The next morning her edema was gone.   Right LE with good doppler signals.  Motor and sensation intact right LE.  Summary:  RIGHT:  - There is no evidence of deep vein thrombosis in the lower extremity.  However, portions of this examination were limited- see technologist  comments above.  She was discharged in stable condition with instructions for elevation multiple times a day alternating with ambulation.    Significant Diagnostic Studies: CBC Lab Results  Component Value Date   WBC 4.0 07/03/2020   HGB  9.4 (L) 07/03/2020   HCT 29.4 (L) 07/03/2020   MCV 98.0 07/03/2020   PLT 186 07/03/2020    BMET    Component Value Date/Time   NA 134 (L) 07/02/2020 2108   K 4.0 07/02/2020 2108   CL 100 07/02/2020 2108   CO2 27 07/02/2020 2108   GLUCOSE 109 (H) 07/02/2020 2108   BUN 18 07/02/2020 2108   BUN 26 (H) 10/27/2012 0910   CREATININE 0.67 07/02/2020 2108   CREATININE 0.68 10/27/2012 0910   CALCIUM 8.6 (L) 07/02/2020 2108   GFRNONAA >60 07/02/2020 2108   GFRNONAA >60 10/27/2012 0910   GFRAA >60 07/19/2019 0443   GFRAA >60 10/27/2012 0910   COAG Lab Results  Component Value Date   INR 1.1 06/27/2020   INR 1.01 02/23/2011   INR 1.02 02/24/2010     Disposition:  Discharge to :Home Discharge Instructions    Call MD for:  redness, tenderness, or signs of infection (pain, swelling, bleeding, redness, odor or green/yellow discharge around incision site)   Complete by: As directed    Call MD for:  severe or increased pain, loss or decreased feeling  in affected limb(s)   Complete by: As directed    Call MD for:  temperature >100.5   Complete by: As directed    Resume previous diet   Complete by: As directed      Allergies as of 07/03/2020      Reactions   Hydrocodone Nausea Only   Meloxicam Nausea  Only   Upset stomach   Other Other (See Comments)   Tramadol Hcl Nausea Only, Other (See Comments)   sweating   Penicillins Nausea Only, Rash   Sulfa Antibiotics Nausea Only, Hives, Rash   And rash      Medication List    TAKE these medications   Aspirin Low Dose 81 MG EC tablet Generic drug: aspirin Take 1 tablet (81 mg total) by mouth daily. Swallow whole.   beta carotene 82641 UNIT capsule Take 25,000 Units by mouth at bedtime.   calcium carbonate 500 MG chewable tablet Commonly known as: TUMS - dosed in mg elemental calcium Chew 1 tablet by mouth 3 (three) times daily as needed for indigestion or heartburn.   cholecalciferol 25 MCG (1000 UNIT) tablet Commonly  known as: VITAMIN D Take 1,000 Units by mouth at bedtime.   ibuprofen 200 MG tablet Commonly known as: ADVIL Take 200 mg by mouth every 8 (eight) hours as needed (back/knee pain).   Iodosorb 0.9 % gel Generic drug: cadexomer iodine Apply 1 application topically daily as needed for wound care.   metoprolol tartrate 25 MG tablet Commonly known as: LOPRESSOR Take 1 tablet (25 mg total) by mouth 2 (two) times daily.   multivitamin with minerals Tabs tablet Take 1 tablet by mouth at bedtime. One-A-Day Multivitamin   oxyCODONE-acetaminophen 5-325 MG tablet Commonly known as: Percocet Take 1 tablet by mouth every 4 (four) hours as needed for severe pain.   RED YEAST RICE EXTRACT PO Take 1,200 mg by mouth at bedtime.      Verbal and written Discharge instructions given to the patient. Wound care per Discharge AVS   Signed: Mosetta Pigeon 07/05/2020, 9:32 AM

## 2020-07-06 ENCOUNTER — Other Ambulatory Visit: Payer: Self-pay

## 2020-07-06 ENCOUNTER — Ambulatory Visit (INDEPENDENT_AMBULATORY_CARE_PROVIDER_SITE_OTHER): Payer: Medicare Other | Admitting: Podiatry

## 2020-07-06 DIAGNOSIS — L97516 Non-pressure chronic ulcer of other part of right foot with bone involvement without evidence of necrosis: Secondary | ICD-10-CM

## 2020-07-06 DIAGNOSIS — Z9889 Other specified postprocedural states: Secondary | ICD-10-CM

## 2020-07-06 DIAGNOSIS — S81811A Laceration without foreign body, right lower leg, initial encounter: Secondary | ICD-10-CM

## 2020-07-06 DIAGNOSIS — I739 Peripheral vascular disease, unspecified: Secondary | ICD-10-CM

## 2020-07-07 ENCOUNTER — Telehealth: Payer: Self-pay | Admitting: Podiatry

## 2020-07-07 MED ORDER — MUPIROCIN 2 % EX OINT: 1.0000 "application " | TOPICAL_OINTMENT | Freq: Every day | CUTANEOUS | 2 refills | Status: DC

## 2020-07-07 NOTE — Telephone Encounter (Signed)
I just sent, for the topical antibiotic ointment for her leg. Thank you!

## 2020-07-07 NOTE — Telephone Encounter (Signed)
Pt's daughter states the pharmacy never received an order for the antibiotic. Please advise.

## 2020-07-10 ENCOUNTER — Encounter: Payer: Self-pay | Admitting: Podiatry

## 2020-07-10 NOTE — Progress Notes (Signed)
  Subjective:  Patient ID: Rebekah Bridges, female    DOB: April 28, 1915,  MRN: [id number]  Chief Complaint  Patient presents with  . Routine Post Op     POV #1 DOS 06/27/2020 DEBRIDEMENT & EXCISION OF WOUND & BONE    DOS: 06/27/2020 Procedure: First MPJ joint resection and implantation of antibiotic spacer  87 y.o. female returns for post-op check.  They are with her daughter-in-law.  Overall doing well and not having much pain  Review of Systems: Negative except as noted in the HPI. Denies N/V/F/Ch.   Objective:  There were no vitals filed for this visit. There is no height or weight on file to calculate BMI. Constitutional Well developed. Well nourished.  Vascular Foot warm and well perfused. Capillary refill normal to all digits.   Neurologic Normal speech. Oriented to person, place, and time. Epicritic sensation to light touch grossly present bilaterally.  Dermatologic Skin healing well without signs of infection. Skin edges well coapted without signs of infection.  Small abrasion lateral leg.  Bypass incisions look like they are healing well  Orthopedic:  Minimal tenderness to palpation noted about the surgical site.    Assessment:   1. 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 Post-operative state   3. PAD (peripheral artery disease) (HCC)   4. Noninfected skin tear of right lower extremity, initial encounter    Plan:  Patient was evaluated and treated and all questions answered.  S/p foot surgery right -Progressing as expected post-operatively. -WB Status: WBAT in surgical shoe I dispensed a new 1 today as the 1 from the hospital does not fit well -Sutures: We will leave intact for 2 more weeks. -Medications: Mupirocin ointment for lateral leg -Foot redressed.  Return in about 2 weeks (around 07/20/2020).

## 2020-07-13 ENCOUNTER — Emergency Department: Payer: Medicare Other

## 2020-07-13 ENCOUNTER — Emergency Department
Admission: EM | Admit: 2020-07-13 | Discharge: 2020-07-13 | Disposition: A | Discharge status: Home / Self Care | Payer: Medicare Other | Attending: Emergency Medicine | Admitting: Emergency Medicine

## 2020-07-13 DIAGNOSIS — Z7982 Long term (current) use of aspirin: Secondary | ICD-10-CM | POA: Insufficient documentation

## 2020-07-13 DIAGNOSIS — M546 Pain in thoracic spine: Secondary | ICD-10-CM | POA: Diagnosis present

## 2020-07-13 DIAGNOSIS — W19XXXA Unspecified fall, initial encounter: Secondary | ICD-10-CM | POA: Diagnosis not present

## 2020-07-13 DIAGNOSIS — Z79899 Other long term (current) drug therapy: Secondary | ICD-10-CM | POA: Insufficient documentation

## 2020-07-13 DIAGNOSIS — Y92002 Bathroom of unspecified non-institutional (private) residence single-family (private) house as the place of occurrence of the external cause: Secondary | ICD-10-CM | POA: Diagnosis not present

## 2020-07-13 DIAGNOSIS — I10 Essential (primary) hypertension: Secondary | ICD-10-CM | POA: Diagnosis not present

## 2020-07-13 DIAGNOSIS — M545 Low back pain, unspecified: Secondary | ICD-10-CM | POA: Diagnosis not present

## 2020-07-13 LAB — CBC WITH DIFFERENTIAL/PLATELET
Abs Immature Granulocytes: 0.11 10*3/uL — ABNORMAL HIGH (ref 0.00–0.07)
Basophils Absolute: 0 10*3/uL (ref 0.0–0.1)
Basophils Relative: 1 %
Eosinophils Absolute: 0 10*3/uL (ref 0.0–0.5)
Eosinophils Relative: 1 %
HCT: 34.6 % — ABNORMAL LOW (ref 36.0–46.0)
Hemoglobin: 10.8 g/dL — ABNORMAL LOW (ref 12.0–15.0)
Immature Granulocytes: 4 %
Lymphocytes Relative: 30 %
Lymphs Abs: 0.9 10*3/uL (ref 0.7–4.0)
MCH: 30.6 pg (ref 26.0–34.0)
MCHC: 31.2 g/dL (ref 30.0–36.0)
MCV: 98 fL (ref 80.0–100.0)
Monocytes Absolute: 0.4 10*3/uL (ref 0.1–1.0)
Monocytes Relative: 15 %
Neutro Abs: 1.4 10*3/uL — ABNORMAL LOW (ref 1.7–7.7)
Neutrophils Relative %: 49 %
Platelets: 200 10*3/uL (ref 150–400)
RBC: 3.53 MIL/uL — ABNORMAL LOW (ref 3.87–5.11)
RDW: 15.3 % (ref 11.5–15.5)
WBC: 2.9 10*3/uL — ABNORMAL LOW (ref 4.0–10.5)
nRBC: 0 % (ref 0.0–0.2)

## 2020-07-13 LAB — URINALYSIS, COMPLETE (UACMP) WITH MICROSCOPIC
Bacteria, UA: NONE SEEN
Bilirubin Urine: NEGATIVE
Glucose, UA: NEGATIVE mg/dL
Hgb urine dipstick: NEGATIVE
Ketones, ur: NEGATIVE mg/dL
Leukocytes,Ua: NEGATIVE
Nitrite: NEGATIVE
Protein, ur: NEGATIVE mg/dL
Specific Gravity, Urine: 1.011 (ref 1.005–1.030)
pH: 8 (ref 5.0–8.0)

## 2020-07-13 LAB — COMPREHENSIVE METABOLIC PANEL
ALT: 8 U/L (ref 0–44)
AST: 22 U/L (ref 15–41)
Albumin: 3.4 g/dL — ABNORMAL LOW (ref 3.5–5.0)
Alkaline Phosphatase: 73 U/L (ref 38–126)
Anion gap: 9 (ref 5–15)
BUN: 22 mg/dL (ref 8–23)
CO2: 26 mmol/L (ref 22–32)
Calcium: 8.8 mg/dL — ABNORMAL LOW (ref 8.9–10.3)
Chloride: 97 mmol/L — ABNORMAL LOW (ref 98–111)
Creatinine, Ser: 0.58 mg/dL (ref 0.44–1.00)
GFR, Estimated: >60 mL/min (ref >60)
Glucose, Bld: 109 mg/dL — ABNORMAL HIGH (ref 70–99)
Potassium: 4.4 mmol/L (ref 3.5–5.1)
Sodium: 132 mmol/L — ABNORMAL LOW (ref 135–145)
Total Bilirubin: 0.7 mg/dL (ref 0.3–1.2)
Total Protein: 7.2 g/dL (ref 6.5–8.1)

## 2020-07-13 NOTE — ED Triage Notes (Signed)
x2 falls 24 hours , no loc, no thinners , no hit head

## 2020-07-13 NOTE — ED Provider Notes (Signed)
[facility] Emergency Department Provider Note  ____________________________________________   Event Date/Time   First MD Initiated Contact with Patient 07/13/20 [phone]     (approximate)  I have reviewed the triage vital signs and the nursing notes.   HISTORY  Chief Complaint Fall (No thinners, no hit head, x 2 falls 24 hours  , no loc )    HPI Rebekah Bridges is a 87 y.o. female here with fall.  The patient woke up this morning and had a fall in her bathroom.  She actually had a fall yesterday, which she believes is due to a mild limp in her left leg after recent vascular surgery.  She has been gradually improving.  She states she has felt clumsy, however, and believes this contributed to her fall.  When she went to the bathroom today, she felt a twinge of pain in her lower back, causing her to fall again.  She not hit her head.  No loss of consciousness.  Patient was able to get herself back up and has been ambulatory since then.  Patient denies any history of recent fever, chills, or other medical illness.  No recent medication changes.  She is not on blood thinners.  Denies any headache.  Family is with her and confirm she is otherwise at her baseline state of health.        Past Medical History:  Diagnosis Date  . Osteoporosis    s/p vertebroplasty for fracutres x 3  . PAF (paroxysmal atrial fibrillation) (HCC) 07/17/2019    Patient Active Problem List   Diagnosis Date Noted  . 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 Hallux valgus with bunions of right foot   . Acute pancreatitis 07/17/2019  . Unspecified atrial fibrillation (HCC) 07/17/2019  . Peripheral polyneuropathy 01/23/2019  . Bilateral sacroiliitis (HCC) 11/05/2018  . Body mass index (BMI) 26.0-26.9, adult 11/05/2018  . Elevated blood-pressure reading, without diagnosis of hypertension 11/05/2018  . Chronic low back pain without sciatica 01/07/2015  .  Degeneration of lumbar intervertebral disc 03/16/2014  . Spinal stenosis of lumbar region 03/03/2014  . Essential hypertension 02/06/2014  . Back pain 01/30/2014  . Neuropathy 01/30/2014  . PAD (peripheral artery disease) (HCC) 08/28/2012  . History of headache 08/28/2012  . HYPERLIPIDEMIA 01/24/2010  . OSTEOARTHRITIS 01/24/2010  . BACK PAIN 01/24/2010  . OSTEOPOROSIS 01/24/2010    Past Surgical History:  Procedure Laterality Date  . ABDOMINAL AORTOGRAM W/LOWER EXTREMITY N/A 05/26/2020   Procedure: ABDOMINAL AORTOGRAM W/LOWER EXTREMITY;  Surgeon: Cephus Shelling, MD;  [facility] IN[facility];  Service: Cardiovascular;  Laterality: N/A;  . BLADDER AUGMENTATION  1964  . FEMORAL-POPLITEAL BYPASS GRAFT Right 06/27/2020   Procedure: RIGHT COOMMON FEMORAL TO ABOVE KNEE POPLITEAL ARTERY BYPASS GRAFT USING RIGHT GREATER SAPHENOUS VEIN;  Surgeon: Cephus Shelling, MD;  Location: [facility] OR;  Service: Vascular;  Laterality: Right;  . SPINE SURGERY  2010  . SPINE SURGERY  2011  . SPINE SURGERY  2012  . WOUND DEBRIDEMENT Right 06/27/2020   Procedure: RIGHT FOOT PARTIAL RESECTION FIRST METATARSAL/ PHALANGEAL JOINT, IMPLANT NON-BIOLOGICAL DRUG IMPLANT;  Surgeon: Edwin Cap, DPM;  Location: [facility] OR;  Service: Podiatry;  Laterality: Right;    Prior to Admission medications   Medication Sig Start Date End Date Taking? Authorizing Provider  aspirin 81 MG EC tablet Take 1 tablet (81 mg total) by mouth daily. Swallow whole. 06/30/20   Setzer, Lynnell Jude, PA-C  beta carotene 25000 UNIT capsule Take 25,000 Units by mouth at bedtime.    [provider]  cadexomer iodine (IODOSORB) 0.9 % gel Apply 1 application topically daily as needed for wound care. Patient not taking: Reported on 07/02/2020 06/01/20   Edwin Cap, DPM  calcium carbonate (TUMS - DOSED IN MG ELEMENTAL CALCIUM) 500 MG chewable tablet Chew 1 tablet by mouth 3 (three) times daily as needed for indigestion or heartburn.     [provider]  cholecalciferol (VITAMIN D) 25 MCG (1000 UNIT) tablet Take 1,000 Units by mouth at bedtime.    [provider]  ibuprofen (ADVIL) 200 MG tablet Take 200 mg by mouth every 8 (eight) hours as needed (back/knee pain).    [provider]  metoprolol tartrate (LOPRESSOR) 25 MG tablet Take 1 tablet (25 mg total) by mouth 2 (two) times daily. 06/30/20   Setzer, Lynnell Jude, PA-C  Multiple Vitamin (MULTIVITAMIN WITH MINERALS) TABS tablet Take 1 tablet by mouth at bedtime. One-A-Day Multivitamin    [provider]  mupirocin ointment (BACTROBAN) 2 % Apply 1 application topically daily. 07/07/20   McDonald, Rachelle Hora, DPM  oxyCODONE-acetaminophen (PERCOCET) 5-325 MG tablet Take 1 tablet by mouth every 4 (four) hours as needed for severe pain. 06/30/20 06/30/21  Setzer, Lynnell Jude, PA-C  RED YEAST RICE EXTRACT PO Take 1,200 mg by mouth at bedtime.    [provider]    Allergies Hydrocodone, Meloxicam, Other, Tramadol hcl, Penicillins, and Sulfa antibiotics  Family History  Problem Relation Age of Onset  . Cancer Father   . Learning disabilities Paternal Grandmother   . Cancer Sister   . Cancer Brother     Social History Social History   Tobacco Use  . Smoking status: Never Smoker  . Smokeless tobacco: Never Used  Vaping Use  . Vaping Use: Never used  Substance Use Topics  . Alcohol use: Not Currently  . Drug use: No    Review of Systems  Review of Systems  Constitutional: Positive for fatigue. Negative for fever.  HENT: Negative for congestion and sore throat.   Eyes: Negative for visual disturbance.  Respiratory: Negative for cough and shortness of breath.   Cardiovascular: Negative for chest pain.  Gastrointestinal: Negative for abdominal pain, diarrhea, nausea and vomiting.  Genitourinary: Negative for flank pain.  Musculoskeletal: Positive for arthralgias and back pain. Negative for neck pain.  Skin: Negative for rash and wound.   Neurological: Negative for weakness.  All other systems reviewed and are negative.    ____________________________________________  PHYSICAL EXAM:      VITAL SIGNS: ED Triage Vitals  Enc Vitals Group     BP 07/13/20 0834 (!) 166/82     Pulse Rate 07/13/20 0834 100     Resp 07/13/20 0834 17     Temp 07/13/20 0834 98.5 F (36.9 C)     Temp Source 07/13/20 0834 Oral     SpO2 07/13/20 0834 98 %     Weight 07/13/20 0835 132 lb 4.4 oz (60 kg)     Height 07/13/20 0835  (1.6 m)     Head Circumference --      Peak Flow --      Pain Score 07/13/20 0834 4     Pain Loc --      Pain Edu? --      Excl. in GC? --      Physical Exam Vitals and nursing note reviewed.  Constitutional:  General: She is not in acute distress.    Appearance: She is well-developed.  HENT:     Head: Normocephalic and atraumatic.     Comments: No apparent head or neck trauma Eyes:     Conjunctiva/sclera: Conjunctivae normal.  Cardiovascular:     Rate and Rhythm: Normal rate and regular rhythm.     Heart sounds: Normal heart sounds. No murmur heard. No friction rub.  Pulmonary:     Effort: Pulmonary effort is normal. No respiratory distress.     Breath sounds: Normal breath sounds. No wheezing or rales.  Abdominal:     General: There is no distension.     Palpations: Abdomen is soft.     Tenderness: There is no abdominal tenderness.  Musculoskeletal:     Cervical back: Neck supple.     Comments: Mild midline lower lumbar pain.  No significant deformity.  No step-offs.  Strength 5 and 5 bilateral lower extremities.  Normal sensation to light touch.  Skin:    General: Skin is warm.     Capillary Refill: Capillary refill takes less than 2 seconds.  Neurological:     Mental Status: She is alert and oriented to person, place, and time.     Motor: No abnormal muscle tone.       ____________________________________________   LABS (all labs ordered are listed, but only abnormal results are  displayed)  Labs Reviewed  CBC WITH DIFFERENTIAL/PLATELET - Abnormal; Notable for the following components:      Result Value   WBC 2.9 (*)    RBC 3.53 (*)    Hemoglobin 10.8 (*)    HCT 34.6 (*)    Neutro Abs 1.4 (*)    Abs Immature Granulocytes 0.11 (*)    All other components within normal limits  COMPREHENSIVE METABOLIC PANEL - Abnormal; Notable for the following components:   Sodium 132 (*)    Chloride 97 (*)    Glucose, Bld 109 (*)    Calcium 8.8 (*)    Albumin 3.4 (*)    All other components within normal limits  URINALYSIS, COMPLETE (UACMP) WITH MICROSCOPIC - Abnormal; Notable for the following components:   Color, Urine YELLOW (*)    APPearance CLEAR (*)    All other components within normal limits    ____________________________________________  EKG: Normal sinus rhythm, ventricular rate 79.  PR 157, QRS 80, QTc 399.  No acute ST elevations or depressions. ________________________________________  RADIOLOGY All imaging, including plain films, CT scans, and ultrasounds, independently reviewed by me, and interpretations confirmed via formal radiology reads.  ED MD interpretation:   X-ray hip: Negative CT L-spine: Multiple chronic compression fractures  Official radiology report(s): CT Lumbar Spine Wo Contrast  Result Date: 07/13/2020 CLINICAL DATA:  Low back pain.  Recent falls. EXAM: CT LUMBAR SPINE WITHOUT CONTRAST TECHNIQUE: Multidetector CT imaging of the lumbar spine was performed without intravenous contrast administration. Multiplanar CT image reconstructions were also generated. COMPARISON:  MRI lumbar spine 02/09/2014. CT abdomen pelvis 07/17/2019 FINDINGS: Segmentation: Normal Alignment: Normal Vertebrae: Multiple fractures. Chronic compression fractures with cement vertebral augmentation at T11, T12, L2, L3. Mild retropulsion of bone at T12 into the canal unchanged. Mild compression fractures of L1 and L4 unchanged from prior studies. No acute fracture or  mass lesion identified. Paraspinal and other soft tissues: Negative for paraspinous mass. Atherosclerotic aorta and iliacs without aneurysm. Calcification in the right renal pelvis without hydronephrosis. Disc levels: T11-12: Mild degenerative change without stenosis T12-L1: Mild retropulsion of T12  into the canal without significant stenosis. Disc degeneration with disc calcification and posterior spurring at T12-L1. Mild spinal stenosis L1-2: Mild spinal stenosis due to disc and facet degeneration with spurring. L2-3: Moderate spinal stenosis. Disc bulging with diffuse disc calcification and bilateral facet hypertrophy. Calcification in the ligamentum flavum bilaterally. Moderate subarticular stenosis bilaterally. L3-4: Moderate spinal stenosis. Disc degeneration with disc calcification and endplate spurring. Bilateral facet hypertrophy. Moderate subarticular stenosis bilaterally L4-5: Moderate spinal stenosis due to disc and facet degeneration and spurring. Subarticular stenosis bilaterally L5-S1: Mild disc and facet degeneration without significant stenosis. IMPRESSION: Multiple chronic compression fractures throughout the lower thoracic and lumbar spine. These are stable from prior studies. No acute fracture Multilevel disc and facet degeneration causing spinal and subarticular stenosis bilaterally at multiple levels. Electronically Signed   By: Marlan Palau M.D.   On: 07/13/2020 10:16   DG HIP UNILAT WITH PELVIS 2-3 VIEWS RIGHT  Result Date: 07/13/2020 CLINICAL DATA:  Right hip pain since a fall last night. Initial encounter. EXAM: DG HIP (WITH OR WITHOUT PELVIS) 2-3V RIGHT COMPARISON:  CT abdomen and pelvis 07/17/2019. FINDINGS: No acute bony or joint abnormality is seen. The patient has moderate appearing bilateral hip osteoarthritis. Chondrocalcinosis is seen about both hips. Serpiginous sclerosis in the right femoral head is most consistent with avascular necrosis and unchanged. Soft tissues are  negative. IMPRESSION: No acute abnormality. Bilateral hip osteoarthritis and chondrocalcinosis. 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 Electronically Signed   By: Drusilla Kanner M.D.   On: 07/13/2020 10:23    ____________________________________________  PROCEDURES   Procedure(s) performed (including Critical Care):  Procedures  ____________________________________________  INITIAL IMPRESSION / MDM / ASSESSMENT AND PLAN / ED COURSE  As part of my medical decision making, I reviewed the following data within the electronic MEDICAL RECORD NUMBER Nursing notes reviewed and incorporated, Old chart reviewed, Notes from prior ED visits, and [place] Controlled Substance Database       *Rebekah Bridges was evaluated in Emergency Department on 07/13/2020 for the symptoms described in the history of present illness. She was evaluated in the context of the global COVID-19 pandemic, which necessitated consideration that the patient might be at risk for infection with the SARS-CoV-2 virus that causes COVID-19. Institutional protocols and algorithms that pertain to the evaluation of patients at risk for COVID-19 are in a state of rapid change based on information released by regulatory bodies including the CDC and federal and state organizations. These policies and algorithms were followed during the patient's care in the ED.  Some ED evaluations and interventions may be delayed as a result of limited staffing during the pandemic.*     Medical Decision Making: [age]-year-old female here with mechanical fall.  Suspect she had some mild acute on chronic back pain secondary to her fall yesterday, which seems mechanical in nature related to her recent surgery.  She is otherwise at her mental and physical baseline.  She is well-appearing and nontoxic.  No head injury.  No loss of consciousness.  EKG nonischemic.  Screening lab work reviewed.  Patient has chronic mild leukopenia that is  not acutely worsened.  No fevers or infectious symptoms.  CMP is unremarkable.  Urinalysis without signs of UTI.  No cough, chest pain, or signs to suggest thoracic trauma or occult pneumonia.  Plain films and CT scan reviewed, showed no evidence of fracture of the hip or injuries of the L-spine.  She is neurovascular intact distally.  Will have her  trial scheduled Tylenol for her back, and encouraged using her walker and fall prevention in the home.  ____________________________________________  FINAL CLINICAL IMPRESSION(S) / ED DIAGNOSES  Final diagnoses:  Fall, initial encounter  Thoracic spine pain     MEDICATIONS GIVEN DURING THIS VISIT:  Medications - No data to display   ED Discharge Orders    None       Note:  This document was prepared using Dragon voice recognition software and may include unintentional dictation errors.   Shaune PollackIsaacs, Obdulia Steier, MD 07/13/20 [phone]

## 2020-07-13 NOTE — Discharge Instructions (Signed)
Take TYLENOL 500 mg tablets, 1 tablet every 6 hours when awake, for the next 2-3 days

## 2020-07-20 ENCOUNTER — Other Ambulatory Visit: Payer: Self-pay

## 2020-07-20 ENCOUNTER — Ambulatory Visit (INDEPENDENT_AMBULATORY_CARE_PROVIDER_SITE_OTHER): Payer: Medicare Other | Admitting: Podiatry

## 2020-07-20 ENCOUNTER — Encounter: Payer: Self-pay | Admitting: Podiatry

## 2020-07-20 DIAGNOSIS — L97516 Non-pressure chronic ulcer of other part of right foot with bone involvement without evidence of necrosis: Secondary | ICD-10-CM

## 2020-07-20 DIAGNOSIS — Z9889 Other specified postprocedural states: Secondary | ICD-10-CM

## 2020-07-20 DIAGNOSIS — I739 Peripheral vascular disease, unspecified: Secondary | ICD-10-CM

## 2020-07-20 NOTE — Progress Notes (Deleted)
Cardiology Office Note:    Date:  07/21/2020   ID:  Rebekah Bridges, Rebekah Bridges 01-02-1916, MRN [id number]  PCP:  Dorothey Baseman, MD  [facility] Cardiologist:  Nicki Guadalajara, MD  [facility] Electrophysiologist:  None   Referring MD: Dorothey Baseman, MD   Chief Complaint: Hospital follow-up  History of Present Illness:    Rebekah Bridges is a 87 y.o. female with a hx of PAD with h/o non-healing wounds, PAF who is being seen for hospital follow-up.  Patient reported prior history of afib in 2021 with acute pancreatitis. She was initially seen by cardiology  during a hospitalization for afib RVR 06/28/20. She was brought in for right common femoral to above-knee popliteal artery bypass with ipsilateral non-reversedgreat saphenous vein and RIGHT FOOT PARTIAL RESECTION FIRST METATARSAL/ PHALANGEAL JOINT, IMPLANT NON-BIOLOGICAL DRUG IMPLANT (Right) due to osteomyelitis.  Post-op she was noted to have elevated heart rate and cardiology was consulted for possible afib. However, after review of EKG it showed SR with frequent PACS, tele showed PAF. BB was added. CHADSVASC of 4, although anticoagulation was held given advanced age. She was dishcarged 5/12.   She was admitted again 5/14- 5/15 for acute onset right lower extremity edema. Doppler US did not show DVT so she was discharged in stable condition.  5/25 seen in the ED for a mechanical fall. Work-up was unremarkable. She was discharged home in good condition with recommendation for walker use at home.   Today,    Past Medical History:  Diagnosis Date  . Osteoporosis    s/p vertebroplasty for fracutres x 3  . PAF (paroxysmal atrial fibrillation) (HCC) 07/17/2019    Past Surgical History:  Procedure Laterality Date  . ABDOMINAL AORTOGRAM W/LOWER EXTREMITY N/A 05/26/2020   Procedure: ABDOMINAL AORTOGRAM W/LOWER EXTREMITY;  Surgeon: Cephus Shelling, MD;  [facility]: [facility] IN[facility];  Service: Cardiovascular;  Laterality: N/A;  .  BLADDER AUGMENTATION  1964  . FEMORAL-POPLITEAL BYPASS GRAFT Right 06/27/2020   Procedure: RIGHT COOMMON FEMORAL TO ABOVE KNEE POPLITEAL ARTERY BYPASS GRAFT USING RIGHT GREATER SAPHENOUS VEIN;  Surgeon: Cephus Shelling, MD;  Location: [facility] OR;  Service: Vascular;  Laterality: Right;  . SPINE SURGERY  2010  . SPINE SURGERY  2011  . SPINE SURGERY  2012  . WOUND DEBRIDEMENT Right 06/27/2020   Procedure: RIGHT FOOT PARTIAL RESECTION FIRST METATARSAL/ PHALANGEAL JOINT, IMPLANT NON-BIOLOGICAL DRUG IMPLANT;  Surgeon: Edwin Cap, DPM;  Location: [facility] OR;  Service: Podiatry;  Laterality: Right;    Current Medications: No outpatient medications have been marked as taking for the 07/21/20 encounter (Appointment) with Fransico Michael, Jatinder Mcdonagh H, PA-C.     Allergies:   Hydrocodone, Meloxicam, Other, Tramadol hcl, Penicillins, and Sulfa antibiotics   Social History   Socioeconomic History  . Marital status: Widowed    Spouse name: Not on file  . Number of children: Not on file  . Years of education: Not on file  . Highest education level: Not on file  Occupational History  . Not on file  Tobacco Use  . Smoking status: Never Smoker  . Smokeless tobacco: Never Used  Vaping Use  . Vaping Use: Never used  Substance and Sexual Activity  . Alcohol use: Not Currently  . Drug use: No  . Sexual activity: Not on file  Other Topics Concern  . Not on file  Social History Narrative  . Not on file   Social Determinants of Health   Financial Resource Strain: Not on file  Food Insecurity: Not on file  Transportation Needs: Not on file  Physical Activity: Not on file  Stress: Not on file  Social Connections: Not on file     Family History: The patient's ***family history includes Cancer in her brother, father, and sister; Learning disabilities in her paternal grandmother.  ROS:   Please see the history of present illness.    *** All other systems reviewed and are negative.  EKGs/Labs/Other Studies  Reviewed:    The following studies were reviewed today:  Echo 06/29/20 1. Technically difficult; no parasternal views obtained.  2. Left ventricular ejection fraction, by estimation, is 60 to 65%. The  left ventricle has normal function. The left ventricle has no regional  wall motion abnormalities. Left ventricular diastolic parameters are  consistent with Grade I diastolic  dysfunction (impaired relaxation). Elevated left atrial pressure.  3. Right ventricular systolic function is normal. The right ventricular  size is normal. There is mildly elevated pulmonary artery systolic  pressure.  4. Left atrial size was moderately dilated.  5. The mitral valve is normal in structure. Mild mitral valve  regurgitation. No evidence of mitral stenosis.  6. The aortic valve was not well visualized. Aortic valve regurgitation  is mild to moderate. No aortic stenosis is present.  7. Pulmonic valve regurgitation Not assessed.  8. The inferior vena cava is normal in size with greater than 50%  respiratory variability, suggesting right atrial pressure of 3 mmHg.    EKG:  EKG is *** ordered today.  The ekg ordered today demonstrates ***  Recent Labs: 07/13/2020: ALT 8; BUN 22; Creatinine, Ser 0.58; Hemoglobin 10.8; Platelets 200; Potassium 4.4; Sodium 132  Recent Lipid Panel    Component Value Date/Time   CHOL 121 06/28/2020 0500   TRIG 63 06/28/2020 0500   HDL 44 06/28/2020 0500   CHOLHDL 2.8 06/28/2020 0500   VLDL 13 06/28/2020 0500   LDLCALC 64 06/28/2020 0500   LDLDIRECT 136.4 09/22/2012 0907     Risk Assessment/Calculations:   {Does this patient have ATRIAL FIBRILLATION?:[id number]}   Physical Exam:    VS:  There were no vitals taken for this visit.    Wt Readings from Last 3 Encounters:  07/13/20 132 lb 4.4 oz (60 kg)  07/03/20 124 lb 1.9 oz (56.3 kg)  06/27/20 117 lb 15.1 oz (53.5 kg)     GEN: *** Well nourished, well developed in no acute distress HEENT:  Normal NECK: No JVD; No carotid bruits LYMPHATICS: No lymphadenopathy CARDIAC: ***RRR, no murmurs, rubs, gallops RESPIRATORY:  Clear to auscultation without rales, wheezing or rhonchi  ABDOMEN: Soft, non-tender, non-distended MUSCULOSKELETAL:  No edema; No deformity  SKIN: Warm and dry NEUROLOGIC:  Alert and oriented x 3 PSYCHIATRIC:  Normal affect   ASSESSMENT:    1. Paroxysmal A-fib (HCC)   2. PAD (peripheral artery disease) (HCC)    PLAN:    In order of problems listed above:  Paroxysmal Afib  PVD  Thrombocytopenia   Disposition: Follow up {follow up:15908} with ***   Shared Decision Making/Informed Consent   {Are you ordering a CV Procedure (e.g. stress test, cath, DCCV, TEE, etc)?   Press F2        :[id number]}    Signed, Rosealynn Mateus David Stall, PA-C  07/21/2020 8:02 AM    [facility]

## 2020-07-21 ENCOUNTER — Ambulatory Visit: Payer: Medicare Other | Admitting: Medical

## 2020-07-22 ENCOUNTER — Ambulatory Visit: Payer: Medicare Other | Admitting: Adult Health

## 2020-07-23 ENCOUNTER — Encounter: Payer: Self-pay | Admitting: Podiatry

## 2020-07-23 NOTE — Progress Notes (Signed)
  Subjective:  Patient ID: Rebekah Bridges, female    DOB: 1915/05/23,  MRN: [id number]    DOS: 06/27/2020 Procedure: First MPJ joint resection and implantation of antibiotic spacer  87 y.o. female returns for post-op check.  Daughter and son in law here today. Minimal pain   Review of Systems: Negative except as noted in the HPI. Denies N/V/F/Ch.   Objective:  There were no vitals filed for this visit. There is no height or weight on file to calculate BMI. Constitutional Well developed. Well nourished.  Vascular Foot warm and well perfused. Capillary refill normal to all digits.   Neurologic Normal speech. Oriented to person, place, and time. Epicritic sensation to light touch grossly present bilaterally.  Dermatologic Leg abrasion healed well. Incision healing well, sutures intact  Orthopedic:  Minimal tenderness to palpation noted about the surgical site.    Assessment:   1. 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 Post-operative state   3. PAD (peripheral artery disease) (HCC)    Plan:  Patient was evaluated and treated and all questions answered.  S/p foot surgery right -Progressing as expected post-operatively. -WB Status: WBAT in surgical shoe -Sutures: All but 2 removed today   Return in about 2 weeks (around 08/03/2020) for suture removal.

## 2020-07-26 ENCOUNTER — Ambulatory Visit (INDEPENDENT_AMBULATORY_CARE_PROVIDER_SITE_OTHER): Payer: Medicare Other | Admitting: Physician Assistant

## 2020-07-26 ENCOUNTER — Other Ambulatory Visit: Payer: Self-pay

## 2020-07-26 VITALS — BP 165/81 | HR 65 | Temp 98.0°F | Resp 20 | Ht 63.0 in | Wt 117.6 lb

## 2020-07-26 DIAGNOSIS — I739 Peripheral vascular disease, unspecified: Secondary | ICD-10-CM

## 2020-07-26 MED ORDER — NYSTATIN 100000 UNIT/GM EX POWD: 1.0000 "application " | Freq: Three times a day (TID) | CUTANEOUS | 0 refills | Status: DC

## 2020-07-26 NOTE — Progress Notes (Signed)
POST OPERATIVE OFFICE NOTE    CC:  F/u for surgery  HPI:  This is a 87 y.o. female who is s/p right common femoral to above-knee popliteal artery bypass with ipsilateral non-reversed great saphenous vein by Dr. Chestine Spore on Jun 27, 2020 secondary to critical limb ischemia of the right lower extremity with tissue loss.  Preoperative endovascular intervention was unsuccessful.  She had a chronic wound of the medial head of the first metatarsal of the right foot and underwent foot surgery immediately after her bypass procedure on the same day by Dr. Lilian Kapur.  She fell several days ago and is complaining of right hip pain. She was evaluated and found to have no fracture/dislocation. She developed epistaxis with aspirin therapy and this was discontinued. She ambulates short distances with roller walker. Denies rest pain.   Allergies  Allergen Reactions  . Hydrocodone Nausea Only  . Meloxicam Nausea Only    Upset stomach  . Other Other (See Comments)  . Tramadol Hcl Nausea Only and Other (See Comments)    sweating  . Penicillins Nausea Only and Rash  . Sulfa Antibiotics Nausea Only, Hives and Rash    And rash    Current Outpatient Medications  Medication Sig Dispense Refill  . aspirin 81 MG EC tablet Take 1 tablet (81 mg total) by mouth daily. Swallow whole. 30 tablet 11  . beta carotene 60109 UNIT capsule Take 25,000 Units by mouth at bedtime.    . cadexomer iodine (IODOSORB) 0.9 % gel Apply 1 application topically daily as needed for wound care. (Patient not taking: Reported on 07/02/2020) 40 g 0  . calcium carbonate (TUMS - DOSED IN MG ELEMENTAL CALCIUM) 500 MG chewable tablet Chew 1 tablet by mouth 3 (three) times daily as needed for indigestion or heartburn.    . cholecalciferol (VITAMIN D) 25 MCG (1000 UNIT) tablet Take 1,000 Units by mouth at bedtime.    Marland Kitchen ibuprofen (ADVIL) 200 MG tablet Take 200 mg by mouth every 8 (eight) hours as needed (back/knee pain).    . metoprolol tartrate  (LOPRESSOR) 25 MG tablet Take 1 tablet (25 mg total) by mouth 2 (two) times daily. 60 tablet 2  . Multiple Vitamin (MULTIVITAMIN WITH MINERALS) TABS tablet Take 1 tablet by mouth at bedtime. One-A-Day Multivitamin    . mupirocin ointment (BACTROBAN) 2 % Apply 1 application topically daily. 30 g 2  . oxyCODONE-acetaminophen (PERCOCET) 5-325 MG tablet Take 1 tablet by mouth every 4 (four) hours as needed for severe pain. 8 tablet 0  . RED YEAST RICE EXTRACT PO Take 1,200 mg by mouth at bedtime.     No current facility-administered medications for this visit.     ROS:  See HPI  Vitals:   07/26/20 1014  Weight: 117 lb 9.6 oz (53.3 kg)  Height: 5\' 3"  (1.6 m)    Physical Exam:  General appearance: Awake, alert in no apparent distress Cardiac: Heart rate and rhythm are regular Respirations: Nonlabored Incisions: Right groin and thigh  incisions are all well approximated. Prominent healing ridge of groin incision. Erythema of skin folds. Extremities: Both feetar warm with intact sensation and motor function.  Right 1st metatarsal incision healing nicely. Pulse/Doppler exam:  Brisk right dorsalis pedis, posterior tibial and peroneal artery Doppler signals  Right groin      Assessment/Plan:  This is a 87 y.o. female who is s/p: right common femoral to above-knee popliteal artery bypass.  She has brisk Doppler signals of her right foot and her  right first metatarsal head resection incision is healing nicely.  Foot is well-perfused.  She was unable to tolerate aspirin therapy.  Follow-up in 1 month with ABIs and right lower extremity arterial duplex.  Right groin dermatitis likely candidiasis.  Will prescribe Nystatin powder.  Wendi Maya, PA-C [facility] [phone]  Clinic MD: Dr. Chestine Spore

## 2020-07-27 ENCOUNTER — Other Ambulatory Visit: Payer: Self-pay

## 2020-07-27 DIAGNOSIS — I739 Peripheral vascular disease, unspecified: Secondary | ICD-10-CM

## 2020-08-03 ENCOUNTER — Other Ambulatory Visit: Payer: Self-pay

## 2020-08-03 ENCOUNTER — Encounter: Payer: Self-pay | Admitting: Podiatry

## 2020-08-03 ENCOUNTER — Ambulatory Visit (INDEPENDENT_AMBULATORY_CARE_PROVIDER_SITE_OTHER): Payer: Medicare Other | Admitting: Podiatry

## 2020-08-03 DIAGNOSIS — Z9889 Other specified postprocedural states: Secondary | ICD-10-CM

## 2020-08-03 DIAGNOSIS — I739 Peripheral vascular disease, unspecified: Secondary | ICD-10-CM

## 2020-08-03 DIAGNOSIS — L97516 Non-pressure chronic ulcer of other part of right foot with bone involvement without evidence of necrosis: Secondary | ICD-10-CM

## 2020-08-03 NOTE — Progress Notes (Signed)
  Subjective:  Patient ID: Rebekah Bridges, female    DOB: 28-Feb-1915,  MRN: [id number]    DOS: 06/27/2020 Procedure: First MPJ joint resection and implantation of antibiotic spacer  87 y.o. female returns for post-op check.  Daughter and son in law here today. Minimal pain   Review of Systems: Negative except as noted in the HPI. Denies N/V/F/Ch.   Objective:  There were no vitals filed for this visit. There is no height or weight on file to calculate BMI. Constitutional Well developed. Well nourished.  Vascular Foot warm and well perfused. Capillary refill normal to all digits.   Neurologic Normal speech. Oriented to person, place, and time. Epicritic sensation to light touch grossly present bilaterally.  Dermatologic Leg abrasion healed well.  Incision is now well-healed  Orthopedic:  Minimal tenderness     Assessment:   1. 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 Post-operative state   3. PAD (peripheral artery disease) (HCC)     Plan:  Patient was evaluated and treated and all questions answered.  S/p foot surgery right -Remaining sutures removed today, applied antibiotic ointment which they will do for 2 weeks can resume regular shoe gear and bathing.  Return as needed   Return if symptoms worsen or fail to improve.

## 2020-08-03 NOTE — Patient Instructions (Signed)
Can resume regular activities and shoe gear  Apply antibiotic ointment to incision light layer once a day for the next 2 weeks until 6/29. Leave open to air  May bathe regularly

## 2020-08-30 ENCOUNTER — Other Ambulatory Visit: Payer: Self-pay

## 2020-08-30 ENCOUNTER — Ambulatory Visit (INDEPENDENT_AMBULATORY_CARE_PROVIDER_SITE_OTHER): Payer: Medicare Other | Admitting: Physician Assistant

## 2020-08-30 ENCOUNTER — Ambulatory Visit (HOSPITAL_COMMUNITY)
Admission: RE | Admit: 2020-08-30 | Discharge: 2020-08-30 | Disposition: A | Discharge status: Home / Self Care | Payer: Medicare Other | Source: Ambulatory Visit | Attending: Vascular Surgery | Admitting: Vascular Surgery

## 2020-08-30 ENCOUNTER — Ambulatory Visit (INDEPENDENT_AMBULATORY_CARE_PROVIDER_SITE_OTHER)
Admission: RE | Admit: 2020-08-30 | Discharge: 2020-08-30 | Disposition: A | Discharge status: Home / Self Care | Payer: Medicare Other | Source: Ambulatory Visit | Attending: Vascular Surgery | Admitting: Vascular Surgery

## 2020-08-30 VITALS — BP 172/85 | HR 71 | Temp 97.9°F | Resp 20 | Ht 63.0 in | Wt 122.8 lb

## 2020-08-30 DIAGNOSIS — I739 Peripheral vascular disease, unspecified: Secondary | ICD-10-CM

## 2020-08-30 NOTE — Progress Notes (Deleted)
  POST OPERATIVE OFFICE NOTE    CC:  F/u for surgery  HPI:  This is a 87 y.o. female who is s/p ***  Allergies  Allergen Reactions   Hydrocodone Nausea Only   Meloxicam Nausea Only    Upset stomach   Other Other (See Comments)   Tramadol Hcl Nausea Only and Other (See Comments)    sweating   Penicillins Nausea Only and Rash   Sulfa Antibiotics Nausea Only, Hives and Rash    And rash    Current Outpatient Medications  Medication Sig Dispense Refill   beta carotene 34287 UNIT capsule Take 25,000 Units by mouth at bedtime.     cadexomer iodine (IODOSORB) 0.9 % gel Apply 1 application topically daily as needed for wound care. 40 g 0   calcium carbonate (TUMS - DOSED IN MG ELEMENTAL CALCIUM) 500 MG chewable tablet Chew 1 tablet by mouth 3 (three) times daily as needed for indigestion or heartburn.     cholecalciferol (VITAMIN D) 25 MCG (1000 UNIT) tablet Take 1,000 Units by mouth at bedtime.     ibuprofen (ADVIL) 200 MG tablet Take 200 mg by mouth every 8 (eight) hours as needed (back/knee pain).     metoprolol tartrate (LOPRESSOR) 25 MG tablet Take 1 tablet (25 mg total) by mouth 2 (two) times daily. (Patient taking differently: Take 12.5 mg by mouth 2 (two) times daily.) 60 tablet 2   Multiple Vitamin (MULTIVITAMIN WITH MINERALS) TABS tablet Take 1 tablet by mouth at bedtime. One-A-Day Multivitamin     mupirocin ointment (BACTROBAN) 2 % Apply 1 application topically daily. 30 g 2   RED YEAST RICE EXTRACT PO Take 1,200 mg by mouth at bedtime.     aspirin 81 MG EC tablet Take 1 tablet (81 mg total) by mouth daily. Swallow whole. (Patient not taking: No sig reported) 30 tablet 11   nystatin (MYCOSTATIN/NYSTOP) powder Apply 1 application topically 3 (three) times daily. (Patient not taking: Reported on 08/30/2020) 15 g 0   oxyCODONE-acetaminophen (PERCOCET) 5-325 MG tablet Take 1 tablet by mouth every 4 (four) hours as needed for severe pain. (Patient not taking: No sig reported) 8 tablet  0   No current facility-administered medications for this visit.     ROS:  See HPI  BP (!) 172/85 (BP Location: Right Arm, Patient Position: Sitting, Cuff Size: Large)   Pulse 71   Temp 97.9 F (36.6 C) (Temporal)   Resp 20   Ht 5\' 3"  (1.6 m)   Wt 122 lb 12.8 oz (55.7 kg)   SpO2 99%   BMI 21.75 kg/m   Physical Exam:  General appearance:*** Cardiac:*** Respiratory:*** Incision:  *** Extremities:  *** Neuro: *** Abdomen:  ***  Assessment/Plan:  This is a 87 y.o. female who is s/p:***    100, PA-C [facility] [phone]  Clinic MD:  ***

## 2020-08-30 NOTE — Progress Notes (Signed)
Peripheral Arterial Disease Follow-Up   VASCULAR SURGERY ASSESSMENT & PLAN:   Rebekah Bridges is a 87 y.o. female who is s/p right common femoral to above-knee popliteal artery bypass with ipsilateral non-reversed great saphenous vein by Dr. Chestine Spore on Jun 27, 2020 secondary to critical limb ischemia of the right lower extremity with tissue loss.  Preoperative endovascular intervention was unsuccessful.  She had a chronic wound of the medial head of the first metatarsal of the right foot and underwent foot surgery immediately after her bypass procedure on the same day by Dr. Lilian Kapur.  Stable peripheral arterial disease.  Right lower extremity femoral to above-knee popliteal artery bypass with vein.  Normal bilateral ABIs.  The patient is without claudication or rest pain.  Her right foot surgical site has healed nicely. Continue optimal medical management of hypertension and follow-up with primary care physician. Has never used tobacco products. Continue the following medications: Aspirin 81 mg daily Follow-up in 9 months with ABIs and right lower extremity arterial duplex  SUBJECTIVE:   She complains of mild right lower extremity edema.  The patient denies lower extremity pain with exercise or rest pain.  Denies skin loss or ulceration. She resides in assisted living center and ambulates frequently with her rolling walker in the hallway.  Her granddaughter accompanies her today. PHYSICAL EXAM:   Vitals:   08/30/20 1522  BP: (!) 172/85  Pulse: 71  Resp: 20  Temp: 97.9 F (36.6 C)  TempSrc: Temporal  SpO2: 99%  Weight: 122 lb 12.8 oz (55.7 kg)  Height: 5\' 3"  (1.6 m)    General appearance: Well-developed, well-nourished in no apparent distress Neurologic: Alert and oriented x 4. Cardiovascular: Heart rate and rhythm are regular.   Extremities: Right first metatarsal surgical site is healing nicely.  Skin intact.  Both feet are warm and well perfused.  Motor function and sensation  intact. Pulse exam: 2+ femoral  pulses bilaterally.  Monophasic bilateral DP and PT Doppler signals.  NON-INVASIVE VASCULAR STUDIES   08/30/2020 ABIs: Right ABI 0.90, TBI 0.25.  Previous ABI 0.57, previous TBI 0.18 Left ABI 1.06, TBI 0.35.  Previous ABI 0.94, previous TBI 0.45  Right lower extremity arterial duplex: Patent right common femoral to above popliteal bypass graft with no evidence for restenosis.   PROBLEM LIST:    The patient's past medical history, past surgical history, family history, social history, allergy list and medication list are reviewed.   CURRENT MEDS:    Current Outpatient Medications:    beta carotene 10/31/2020 UNIT capsule, Take 25,000 Units by mouth at bedtime., Disp: , Rfl:    cadexomer iodine (IODOSORB) 0.9 % gel, Apply 1 application topically daily as needed for wound care., Disp: 40 g, Rfl: 0   calcium carbonate (TUMS - DOSED IN MG ELEMENTAL CALCIUM) 500 MG chewable tablet, Chew 1 tablet by mouth 3 (three) times daily as needed for indigestion or heartburn., Disp: , Rfl:    cholecalciferol (VITAMIN D) 25 MCG (1000 UNIT) tablet, Take 1,000 Units by mouth at bedtime., Disp: , Rfl:    ibuprofen (ADVIL) 200 MG tablet, Take 200 mg by mouth every 8 (eight) hours as needed (back/knee pain)., Disp: , Rfl:    metoprolol tartrate (LOPRESSOR) 25 MG tablet, Take 1 tablet (25 mg total) by mouth 2 (two) times daily. (Patient taking differently: Take 12.5 mg by mouth 2 (two) times daily.), Disp: 60 tablet, Rfl: 2   Multiple Vitamin (MULTIVITAMIN WITH MINERALS) TABS tablet, Take 1 tablet by mouth at  bedtime. One-A-Day Multivitamin, Disp: , Rfl:    mupirocin ointment (BACTROBAN) 2 %, Apply 1 application topically daily., Disp: 30 g, Rfl: 2   RED YEAST RICE EXTRACT PO, Take 1,200 mg by mouth at bedtime., Disp: , Rfl:    aspirin 81 MG EC tablet, Take 1 tablet (81 mg total) by mouth daily. Swallow whole. (Patient not taking: No sig reported), Disp: 30 tablet, Rfl: 11   nystatin  (MYCOSTATIN/NYSTOP) powder, Apply 1 application topically 3 (three) times daily. (Patient not taking: Reported on 08/30/2020), Disp: 15 g, Rfl: 0   oxyCODONE-acetaminophen (PERCOCET) 5-325 MG tablet, Take 1 tablet by mouth every 4 (four) hours as needed for severe pain. (Patient not taking: No sig reported), Disp: 8 tablet, Rfl: 0   REVIEW OF SYSTEMS:   [X]  denotes positive finding, [ ]  denotes negative finding Cardiac  Comments:  Chest pain or chest pressure:    Shortness of breath upon exertion:    Short of breath when lying flat:    Irregular heart rhythm:        Vascular    Pain in calf, thigh, or hip brought on by ambulation:    Pain in feet at night that wakes you up from your sleep:     Blood clot in your veins:    Leg swelling:  x       Pulmonary    Oxygen at home:    Productive cough:     Wheezing:         Neurologic    Sudden weakness in arms or legs:     Sudden numbness in arms or legs:     Sudden onset of difficulty speaking or slurred speech:    Temporary loss of vision in one eye:     Problems with dizziness:         Gastrointestinal    Blood in stool:     Vomited blood:         Genitourinary    Burning when urinating:     Blood in urine:        Psychiatric    Major depression:         Hematologic    Bleeding problems:    Problems with blood clotting too easily:        Skin    Rashes or ulcers:        Constitutional    Fever or chills:     , PA-C  Office: [phone] 08/30/2020 Clinic MD: Dr. [phone]

## 2020-09-01 ENCOUNTER — Other Ambulatory Visit: Payer: Self-pay

## 2020-09-01 DIAGNOSIS — I739 Peripheral vascular disease, unspecified: Secondary | ICD-10-CM

## 2020-10-19 ENCOUNTER — Inpatient Hospital Stay
Admission: EM | Admit: 2020-10-19 | Discharge: 2020-10-23 | DRG: 280 | Disposition: A | Discharge status: Skilled Nursing Facility | Payer: Medicare Other | Source: Skilled Nursing Facility | Attending: Hospitalist | Admitting: Hospitalist

## 2020-10-19 ENCOUNTER — Emergency Department: Payer: Medicare Other

## 2020-10-19 ENCOUNTER — Other Ambulatory Visit: Payer: Self-pay

## 2020-10-19 DIAGNOSIS — Z885 Allergy status to narcotic agent status: Secondary | ICD-10-CM

## 2020-10-19 DIAGNOSIS — Z66 Do not resuscitate: Secondary | ICD-10-CM | POA: Diagnosis present

## 2020-10-19 DIAGNOSIS — Z79899 Other long term (current) drug therapy: Secondary | ICD-10-CM

## 2020-10-19 DIAGNOSIS — Z882 Allergy status to sulfonamides status: Secondary | ICD-10-CM

## 2020-10-19 DIAGNOSIS — M810 Age-related osteoporosis without current pathological fracture: Secondary | ICD-10-CM | POA: Diagnosis present

## 2020-10-19 DIAGNOSIS — E8770 Fluid overload, unspecified: Secondary | ICD-10-CM | POA: Diagnosis present

## 2020-10-19 DIAGNOSIS — Z886 Allergy status to analgesic agent status: Secondary | ICD-10-CM

## 2020-10-19 DIAGNOSIS — Z8249 Family history of ischemic heart disease and other diseases of the circulatory system: Secondary | ICD-10-CM

## 2020-10-19 DIAGNOSIS — J9601 Acute respiratory failure with hypoxia: Secondary | ICD-10-CM | POA: Diagnosis present

## 2020-10-19 DIAGNOSIS — I480 Paroxysmal atrial fibrillation: Secondary | ICD-10-CM | POA: Diagnosis present

## 2020-10-19 DIAGNOSIS — I214 Non-ST elevation (NSTEMI) myocardial infarction: Secondary | ICD-10-CM | POA: Diagnosis not present

## 2020-10-19 DIAGNOSIS — Z7982 Long term (current) use of aspirin: Secondary | ICD-10-CM

## 2020-10-19 DIAGNOSIS — Z880 Allergy status to penicillin: Secondary | ICD-10-CM

## 2020-10-19 DIAGNOSIS — Z20822 Contact with and (suspected) exposure to covid-19: Secondary | ICD-10-CM | POA: Diagnosis present

## 2020-10-19 DIAGNOSIS — I10 Essential (primary) hypertension: Secondary | ICD-10-CM | POA: Diagnosis present

## 2020-10-19 NOTE — ED Triage Notes (Signed)
Pt to ED via EMS from [facility], pt had chest pain that started earlier tonight, pt states since pain has gotten a little better, pain is central to her chest, pt cannot describe pain. Pt states she does have some sob with pain. Pt denies cardiac hx. Pt was given 324 asa by ems

## 2020-10-19 NOTE — ED Provider Notes (Signed)
[facility] Emergency Department Provider Note  ____________________________________________   Event Date/Time   First MD Initiated Contact with Patient 10/19/20 2345     (approximate)  I have reviewed the triage vital signs and the nursing notes.   HISTORY  Chief Complaint Chest Pain    HPI Rebekah Bridges is a 87 y.o. female who presents by EMS for evaluation of acute onset left-sided chest pain.  She was sitting at rest when the pain started.  No recent trauma.  She says she was in her normal state of health until the pain began.  She has a hard time describing whether it was sharp or heavy but it was some combination of the two.  The symptoms were severe when they occurred but now they are almost completely gone.  It was accompanied by slight shortness of breath.  She says she thought it would make her nauseated but she never felt nausea and had no vomiting.  She was not sweating.  The pain improved after she was picked up by paramedics and received 324 mg of aspirin p.o.  She denies any recent fever, sore throat, abdominal pain, and dysuria.  She denies any history of heart problems although her record indicates she has a history of paroxysmal atrial fibrillation.  She says she has never had a heart attack.  She said that her mother had a heart attack but she did not indicate what age.  The patient does not take anticoagulation and in fact only takes metoprolol tartrate 25 mg twice daily as well as some vitamin supplements but is on no other medications.     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)    Hallux valgus with bunions of right foot    Acute  pancreatitis 07/17/2019   Unspecified atrial fibrillation (HCC) 07/17/2019   Peripheral polyneuropathy 01/23/2019   Bilateral sacroiliitis (HCC) 11/05/2018   Body mass index (BMI) 26.0-26.9, adult 11/05/2018   Elevated blood-pressure reading, without diagnosis of hypertension 11/05/2018   Chronic low back pain without sciatica 01/07/2015   Degeneration of lumbar intervertebral disc 03/16/2014   Spinal stenosis of lumbar region 03/03/2014   Essential hypertension 02/06/2014   Back pain 01/30/2014   Neuropathy 01/30/2014   PAD (peripheral artery disease) (HCC) 08/28/2012   History of headache 08/28/2012   HYPERLIPIDEMIA 01/24/2010   OSTEOARTHRITIS 01/24/2010   BACK PAIN 01/24/2010   OSTEOPOROSIS 01/24/2010    Past Surgical History:  Procedure Laterality Date   ABDOMINAL AORTOGRAM W/LOWER EXTREMITY N/A 05/26/2020   Procedure: ABDOMINAL AORTOGRAM W/LOWER EXTREMITY;  Surgeon: Cephus Shelling, MD;  Location: [facility] INVASIVE [facility];  Service: Cardiovascular;  Laterality: N/A;   BLADDER AUGMENTATION  1964   FEMORAL-POPLITEAL BYPASS GRAFT Right 06/27/2020   Procedure: RIGHT COOMMON FEMORAL TO ABOVE KNEE POPLITEAL ARTERY BYPASS GRAFT USING RIGHT GREATER SAPHENOUS VEIN;  Surgeon: Cephus Shelling, MD;  Location: [facility] OR;  Service: Vascular;  Laterality: Right;   SPINE SURGERY  2010   SPINE SURGERY  2011   SPINE SURGERY  2012   WOUND DEBRIDEMENT Right 06/27/2020   Procedure: RIGHT FOOT PARTIAL RESECTION FIRST METATARSAL/ PHALANGEAL JOINT, IMPLANT NON-BIOLOGICAL DRUG IMPLANT;  Surgeon: Edwin Cap, DPM;  Location: [facility] OR;  Service: Podiatry;  Laterality: Right;    Prior to Admission medications   Medication Sig Start Date End Date Taking? Authorizing Provider  beta carotene 16109 UNIT capsule Take 25,000 Units by mouth at bedtime.   Yes [provider]  cholecalciferol (VITAMIN D) 25 MCG (1000 UNIT) tablet Take 1,000 Units by mouth at bedtime.   Yes [provider]   metoprolol tartrate (LOPRESSOR) 25 MG tablet Take 1 tablet (25 mg total) by mouth 2 (two) times daily. Patient taking differently: Take 12.5 mg by mouth 2 (two) times daily. 06/30/20  Yes Setzer, Lynnell Jude, PA-C  Multiple Vitamin (MULTIVITAMIN WITH MINERALS) TABS tablet Take 1 tablet by mouth at bedtime. One-A-Day Multivitamin   Yes [provider]  mupirocin ointment (BACTROBAN) 2 % Apply 1 application topically daily. 07/07/20  Yes McDonald, Adam R, DPM  RED YEAST RICE EXTRACT PO Take 1,200 mg by mouth at bedtime.   Yes [provider]  aspirin 81 MG EC tablet Take 1 tablet (81 mg total) by mouth daily. Swallow whole. Patient not taking: No sig reported 06/30/20   Milinda Antis, PA-C  cadexomer iodine (IODOSORB) 0.9 % gel Apply 1 application topically daily as needed for wound care. 06/01/20   McDonald, Rachelle Hora, DPM  calcium carbonate (TUMS - DOSED IN MG ELEMENTAL CALCIUM) 500 MG chewable tablet Chew 1 tablet by mouth 3 (three) times daily as needed for indigestion or heartburn.    [provider]  ibuprofen (ADVIL) 200 MG tablet Take 200 mg by mouth every 8 (eight) hours as needed (back/knee pain).    [provider]  nystatin (MYCOSTATIN/NYSTOP) powder Apply 1 application topically 3 (three) times daily. Patient not taking: No sig reported 07/26/20   Setzer, Lynnell Jude, PA-C  oxyCODONE-acetaminophen (PERCOCET) 5-325 MG tablet Take 1 tablet by mouth every 4 (four) hours as needed for severe pain. Patient not taking: No sig reported 06/30/20 06/30/21  Setzer, Lynnell Jude, PA-C    Allergies Hydrocodone, Meloxicam, Other, Tramadol hcl, Penicillins, and Sulfa antibiotics  Family History  Problem Relation Age of Onset   Cancer Father    Learning disabilities Paternal Grandmother    Cancer Sister    Cancer Brother     Social History Social History   Tobacco Use   Smoking status: Never   Smokeless tobacco: Never  Vaping Use   Vaping Use: Never used  Substance  Use Topics   Alcohol use: Not Currently   Drug use: No    Review of Systems Constitutional: No fever/chills Eyes: No visual changes. ENT: No sore throat. Cardiovascular: Positive for cute onset chest pain. Respiratory: Positive for mild shortness of breath associated with chest pain, now resolved. Gastrointestinal: No abdominal pain.  No nausea, no vomiting.  No diarrhea.  No constipation. Genitourinary: Negative for dysuria. Musculoskeletal: Negative for neck pain.  Negative for back pain. Integumentary: Negative for rash. Neurological: Negative for headaches, focal weakness or numbness.   ____________________________________________   PHYSICAL EXAM:  ED Triage Vitals  Enc Vitals Group     BP 10/19/20 2347 (!) 176/95     Pulse Rate 10/19/20 2347 88     Resp 10/19/20 2347 13     Temp 10/19/20 2347 98.3 F (36.8 C)     Temp Source 10/19/20 2347 Oral     SpO2 10/19/20 2347 96 %     Weight 10/19/20 2346 64.3 kg (141 lb 11.2 oz)     Height 10/19/20 2346 1.499 m (4'  11")     Head Circumference --      Peak Flow --      Pain Score 10/19/20 2345 0     Pain Loc --      Pain Edu? --      Excl. in GC? --      Constitutional: Alert and oriented.  Patient appears decades younger than chronological age.  Sharp, interactive, no acute distress. Eyes: Conjunctivae are normal.  Head: Atraumatic. Nose: No congestion/rhinnorhea. Mouth/Throat: Patient is wearing a mask. Neck: No stridor.  No meningeal signs.   Cardiovascular: Normal rate, regular rhythm. Good peripheral circulation. Respiratory: Normal respiratory effort.  No retractions.  Lungs are clear to auscultation bilaterally. Gastrointestinal: Soft and nontender. No distention.  Musculoskeletal: No lower extremity tenderness nor edema. No gross deformities of extremities. Neurologic:  Normal speech and language. No gross focal neurologic deficits are appreciated.  Skin:  Skin is warm, dry and intact. Psychiatric: Mood and  affect are normal. Speech and behavior are normal.  ____________________________________________   LABS (all labs ordered are listed, but only abnormal results are displayed)  Labs Reviewed  CBC - Abnormal; Notable for the following components:      Result Value   Platelets 146 (*)    All other components within normal limits  COMPREHENSIVE METABOLIC PANEL - Abnormal; Notable for the following components:   Glucose, Bld 117 (*)    BUN 24 (*)    All other components within normal limits  TROPONIN I (HIGH SENSITIVITY) - Abnormal; Notable for the following components:   Troponin I (High Sensitivity) 779 (*)    All other components within normal limits  TROPONIN I (HIGH SENSITIVITY) - Abnormal; Notable for the following components:   Troponin I (High Sensitivity) 1,062 (*)    All other components within normal limits  RESP PANEL BY RT-PCR (FLU A&B, COVID) ARPGX2  PROTIME-INR  LIPASE, BLOOD  APTT  BASIC METABOLIC PANEL  CBC  HEPARIN LEVEL (UNFRACTIONATED)   ____________________________________________  EKG  ED ECG REPORT #1 I, Loleta Rose, the attending physician, personally viewed and interpreted this ECG.  Date: 10/19/2020 EKG Time: 23:45 Rate: 78 Rhythm: normal sinus rhythm QRS Axis: normal Intervals: normal ST/T Wave abnormalities: The patient has about 1 mm of ST depression and inverted T waves in lead 3, as well as some ST segment elevation in lead V2.  There is artifact present most notable and 1, 2, aVR, and aVL.  However there is not contiguous ST segment elevation and she does not be criteria for STEMI. Narrative Interpretation: no definitive evidence of acute ischemia; does not meet STEMI criteria.  ED ECG REPORT #2 I, Loleta Rose, the attending physician, personally viewed and interpreted this ECG.  Date: 10/19/2020 EKG Time: 23: 54 Rate: 78 Rhythm: normal sinus rhythm QRS Axis: normal Intervals: normal ST/T Wave abnormalities: Similar ST segment  changes in leads III and V2 as seen in the original EKG but slightly less pronounced. Narrative Interpretation: no definitive evidence of acute ischemia; does not meet STEMI criteria.  ____________________________________________  RADIOLOGY I, Loleta Rose, personally viewed and evaluated these images (plain radiographs) as part of my medical decision making, as well as reviewing the written report by the radiologist.  ED MD interpretation: No evidence of acute abnormality on chest x-ray  Official radiology report(s): DG Chest Port 1 View  Result Date: 10/20/2020 CLINICAL DATA:  Chest pain EXAM: PORTABLE CHEST 1 VIEW COMPARISON:  04/27/2010 FINDINGS: Increased interstitial markings. Mild bibasilar atelectasis. No pleural effusion  or pneumothorax. The heart is normal in size.  Thoracic aortic atherosclerosis. Degenerative changes of the right shoulder with suspected chronic posttraumatic deformity involving the humeral neck. IMPRESSION: Mild bibasilar opacities, likely atelectasis. No evidence of acute cardiopulmonary disease. Electronically Signed   By: Charline BillsSriyesh  Krishnan M.D.   On: 10/20/2020 00:08    ____________________________________________   PROCEDURES   Procedure(s) performed (including Critical Care):  .1-3 Lead EKG Interpretation  Date/Time: 10/20/2020 12:04 AM Performed by: Loleta RoseForbach, Carleigh Buccieri, MD Authorized by: Loleta RoseForbach, Irfan Veal, MD     Interpretation: normal     ECG rate:  75 .Critical Care  Date/Time: 10/20/2020 1:03 AM Performed by: Loleta RoseForbach, Tionne Carelli, MD Authorized by: Loleta RoseForbach, Roth Ress, MD   Critical care provider statement:    Critical care time (minutes):  60   Critical care time was exclusive of:  Separately billable procedures and treating other patients   Critical care was necessary to treat or prevent imminent or life-threatening deterioration of the following conditions: NSTEMI requiring heparin.   Critical care was time spent personally by me on the following activities:   Development of treatment plan with patient or surrogate, discussions with consultants, evaluation of patient's response to treatment, examination of patient, obtaining history from patient or surrogate, ordering and performing treatments and interventions, ordering and review of laboratory studies, ordering and review of radiographic studies, pulse oximetry, re-evaluation of patient's condition and review of old charts   ____________________________________________   INITIAL IMPRESSION / MDM / ASSESSMENT AND PLAN / ED COURSE  As part of my medical decision making, I reviewed the following data within the electronic MEDICAL RECORD NUMBER History obtained from family, Nursing notes reviewed and incorporated, Labs reviewed , EKG interpreted , Old chart reviewed, Radiograph reviewed , Discussed with admitting physician , Discussed with cardiologist (Dr. Darrold JunkerParaschos), and reviewed Notes from prior ED visits   Differential diagnosis includes, but is not limited to, ACS, musculoskeletal pain, pneumonia, PE.  The patient is on the cardiac monitor to evaluate for evidence of arrhythmia and/or significant heart rate changes.  Onset of pain is concerning for ACS but patient feels much better now.  In spite of her age, she appears much younger than stated age, is interactive and in good spirits, and states that her pain is almost completely gone.  She says she feels much better than before.  EKGs and lab work are pending.  No infectious signs or symptoms and other than hypertension, her vital signs are stable and reassuring.     Clinical Course as of 10/20/20 [id number]  Wed Oct 19, 2020  2353 The patient's EKG has some nonspecific findings that are interpreted by the computer as an acute MI.  However, given the fact that the patient feels almost completely back to normal and is denying chest pain, just saying she feels a little bit "strange", and that there is artifact in multiple leads which are confusing the  computerized interpretation, I am repeating an EKG.  However she does not meet STEMI criteria; lead V2 shows some ST segment elevation but there is not significant elevation in 2 contiguous leads.  There is slight ST segment depression in lead III and inverted T waves but again without contiguous abnormalities.  I again reassessed the patient and she continues to feel much better than she did before.  We are repeating an EKG. [CF]  Thu Oct 20, 2020  0004 Repeat EKG actually looked a little bit better than the original even though the computer is still interpreting as acute MI.  Patient is comfortable, not reporting chest pain at this time.  Labs pending. [CF]  0029 DG Chest [facility] I personally reviewed the patient's imaging and agree with the radiologist's interpretation that there is no evidence of acute abnormality on chest x-ray. [CF]  0050 Discussed case by phone extensively with Dr. Darrold Junker.  He agrees that given the fact that the patient's pain has almost completely resolved, that her EKGs are suggestive of ischemia but not diagnostic for STEMI, and her age, the best thing for her is to treat with heparin and manage her conservatively.  He knows I will contact him if her clinical situation changes and her chest pain worsens. [CF]  0052 Comprehensive metabolic panel, CBC, coags, and lipase are all within normal limits. [CF]  [phone] Consulting hospitalist for admission [CF]  0122 Discussed case by phone with Dr. Arville Care with the hospitalist service and he will admit. [CF]  0122 Of note, I had a conversation with the patient and her grandson who is now at bedside about the patient's CODE STATUS.  She is currently full code and apparently there are at least 4 family members that sure healthcare decision making with the patient.  The patient thinks that she would probably like to be DNR, meaning no intubation and no CPR, but would also entertain catheterization if offered to her.  I think this is very  appropriate, and the grandson who is at bedside agrees, but given that only 1 family member is present and that this is a lot of information to process in a short period of time, we all agreed that she would remain full code currently but this is an issue that needs to be readdressed during this hospitalization and she may end up becoming DNR. [CF]    Clinical Course User Index [CF] Loleta Rose, MD     ____________________________________________  FINAL CLINICAL IMPRESSION(S) / ED DIAGNOSES  Final diagnoses:  NSTEMI (non-ST elevated myocardial infarction) (HCC)     MEDICATIONS GIVEN DURING THIS VISIT:  Medications  heparin ADULT infusion 100 units/mL (25000 units/262mL) (650 Units/hr Intravenous New Bag/Given 10/20/20 0201)  metoprolol tartrate (LOPRESSOR) tablet 12.5 mg (12.5 mg Oral Given 10/20/20 0224)  calcium carbonate (TUMS - dosed in mg elemental calcium) chewable tablet 200 mg of elemental calcium (has no administration in time range)  cholecalciferol (VITAMIN D3) tablet 1,000 Units (has no administration in time range)  multivitamin with minerals tablet 1 tablet (has no administration in time range)  0.9 %  sodium chloride infusion ( Intravenous New Bag/Given 10/20/20 0225)  acetaminophen (TYLENOL) tablet 650 mg (has no administration in time range)    Or  acetaminophen (TYLENOL) suppository 650 mg (has no administration in time range)  traZODone (DESYREL) tablet 25 mg (has no administration in time range)  ondansetron (ZOFRAN) tablet 4 mg (has no administration in time range)    Or  ondansetron (ZOFRAN) injection 4 mg (has no administration in time range)  magnesium hydroxide (MILK OF MAGNESIA) suspension 30 mL (has no administration in time range)  morphine 2 MG/ML injection 1-2 mg (has no administration in time range)  nitroGLYCERIN (NITROSTAT) SL tablet 0.4 mg (has no administration in time range)  atorvastatin (LIPITOR) tablet 80 mg (has no administration in time range)   heparin bolus via infusion 3,400 Units (3,400 Units Intravenous Bolus from Bag 10/20/20 0201)     ED Discharge Orders     None        Note:  This document was prepared using  [employer] and may include unintentional dictation errors.   Loleta Rose, MD 10/20/20 [phone]

## 2020-10-20 ENCOUNTER — Encounter: Payer: Self-pay | Admitting: Family Medicine

## 2020-10-20 ENCOUNTER — Inpatient Hospital Stay
Admit: 2020-10-20 | Discharge: 2020-10-20 | Disposition: A | Discharge status: Home / Self Care | Payer: Medicare Other | Attending: Family Medicine | Admitting: Family Medicine

## 2020-10-20 DIAGNOSIS — Z8249 Family history of ischemic heart disease and other diseases of the circulatory system: Secondary | ICD-10-CM | POA: Diagnosis not present

## 2020-10-20 DIAGNOSIS — Z79899 Other long term (current) drug therapy: Secondary | ICD-10-CM | POA: Diagnosis not present

## 2020-10-20 DIAGNOSIS — Z7982 Long term (current) use of aspirin: Secondary | ICD-10-CM | POA: Diagnosis not present

## 2020-10-20 DIAGNOSIS — Z882 Allergy status to sulfonamides status: Secondary | ICD-10-CM | POA: Diagnosis not present

## 2020-10-20 DIAGNOSIS — I480 Paroxysmal atrial fibrillation: Secondary | ICD-10-CM | POA: Diagnosis present

## 2020-10-20 DIAGNOSIS — Z885 Allergy status to narcotic agent status: Secondary | ICD-10-CM | POA: Diagnosis not present

## 2020-10-20 DIAGNOSIS — Z886 Allergy status to analgesic agent status: Secondary | ICD-10-CM | POA: Diagnosis not present

## 2020-10-20 DIAGNOSIS — I214 Non-ST elevation (NSTEMI) myocardial infarction: Secondary | ICD-10-CM | POA: Diagnosis present

## 2020-10-20 DIAGNOSIS — I10 Essential (primary) hypertension: Secondary | ICD-10-CM

## 2020-10-20 DIAGNOSIS — Z880 Allergy status to penicillin: Secondary | ICD-10-CM | POA: Diagnosis not present

## 2020-10-20 DIAGNOSIS — M810 Age-related osteoporosis without current pathological fracture: Secondary | ICD-10-CM | POA: Diagnosis present

## 2020-10-20 DIAGNOSIS — E8770 Fluid overload, unspecified: Secondary | ICD-10-CM | POA: Diagnosis present

## 2020-10-20 DIAGNOSIS — J9601 Acute respiratory failure with hypoxia: Secondary | ICD-10-CM | POA: Diagnosis present

## 2020-10-20 DIAGNOSIS — Z20822 Contact with and (suspected) exposure to covid-19: Secondary | ICD-10-CM | POA: Diagnosis present

## 2020-10-20 DIAGNOSIS — Z66 Do not resuscitate: Secondary | ICD-10-CM | POA: Diagnosis present

## 2020-10-20 LAB — TROPONIN I (HIGH SENSITIVITY)
Troponin I (High Sensitivity): 1062 ng/L (ref <18)
Troponin I (High Sensitivity): 13022 ng/L (ref <18)
Troponin I (High Sensitivity): 18894 ng/L (ref <18)
Troponin I (High Sensitivity): 6555 ng/L (ref <18)
Troponin I (High Sensitivity): 779 ng/L (ref <18)

## 2020-10-20 LAB — COMPREHENSIVE METABOLIC PANEL
ALT: 10 U/L (ref 0–44)
AST: 30 U/L (ref 15–41)
Albumin: 3.8 g/dL (ref 3.5–5.0)
Alkaline Phosphatase: 97 U/L (ref 38–126)
Anion gap: 6 (ref 5–15)
BUN: 24 mg/dL — ABNORMAL HIGH (ref 8–23)
CO2: 29 mmol/L (ref 22–32)
Calcium: 9.1 mg/dL (ref 8.9–10.3)
Chloride: 101 mmol/L (ref 98–111)
Creatinine, Ser: 0.79 mg/dL (ref 0.44–1.00)
GFR, Estimated: >60 mL/min (ref >60)
Glucose, Bld: 117 mg/dL — ABNORMAL HIGH (ref 70–99)
Potassium: 4 mmol/L (ref 3.5–5.1)
Sodium: 136 mmol/L (ref 135–145)
Total Bilirubin: 0.6 mg/dL (ref 0.3–1.2)
Total Protein: 7.4 g/dL (ref 6.5–8.1)

## 2020-10-20 LAB — ECHOCARDIOGRAM COMPLETE
AR max vel: 1.96 cm2
AV Area VTI: 2.73 cm2
AV Area mean vel: 2.09 cm2
AV Mean grad: 2.5 mmHg
AV Peak grad: 3.9 mmHg
Ao pk vel: 0.99 m/s
Area-P 1/2: 5.62 cm2
Height: 59 in
P 1/2 time: 355 msec
S' Lateral: 2.7 cm
Weight: 2267.2 oz

## 2020-10-20 LAB — BASIC METABOLIC PANEL
Anion gap: 7 (ref 5–15)
BUN: 27 mg/dL — ABNORMAL HIGH (ref 8–23)
CO2: 27 mmol/L (ref 22–32)
Calcium: 8.7 mg/dL — ABNORMAL LOW (ref 8.9–10.3)
Chloride: 102 mmol/L (ref 98–111)
Creatinine, Ser: 0.65 mg/dL (ref 0.44–1.00)
GFR, Estimated: >60 mL/min (ref >60)
Glucose, Bld: 124 mg/dL — ABNORMAL HIGH (ref 70–99)
Potassium: 4.2 mmol/L (ref 3.5–5.1)
Sodium: 136 mmol/L (ref 135–145)

## 2020-10-20 LAB — CBC
HCT: 40.5 % (ref 36.0–46.0)
HCT: 41 % (ref 36.0–46.0)
Hemoglobin: 13.1 g/dL (ref 12.0–15.0)
Hemoglobin: 13.2 g/dL (ref 12.0–15.0)
MCH: 30.5 pg (ref 26.0–34.0)
MCH: 31.5 pg (ref 26.0–34.0)
MCHC: 32 g/dL (ref 30.0–36.0)
MCHC: 32.6 g/dL (ref 30.0–36.0)
MCV: 95.6 fL (ref 80.0–100.0)
MCV: 96.7 fL (ref 80.0–100.0)
Platelets: 146 10*3/uL — ABNORMAL LOW (ref 150–400)
Platelets: 148 10*3/uL — ABNORMAL LOW (ref 150–400)
RBC: 4.19 MIL/uL (ref 3.87–5.11)
RBC: 4.29 MIL/uL (ref 3.87–5.11)
RDW: 14.5 % (ref 11.5–15.5)
RDW: 14.5 % (ref 11.5–15.5)
WBC: 4.1 10*3/uL (ref 4.0–10.5)
WBC: 4.8 10*3/uL (ref 4.0–10.5)
nRBC: 0 % (ref 0.0–0.2)
nRBC: 0 % (ref 0.0–0.2)

## 2020-10-20 LAB — APTT: aPTT: 30 seconds (ref 24–36)

## 2020-10-20 LAB — RESP PANEL BY RT-PCR (FLU A&B, COVID) ARPGX2
Influenza A by PCR: NEGATIVE
Influenza B by PCR: NEGATIVE
SARS Coronavirus 2 by RT PCR: NEGATIVE

## 2020-10-20 LAB — LIPASE, BLOOD: Lipase: 46 U/L (ref 11–51)

## 2020-10-20 LAB — HEPARIN LEVEL (UNFRACTIONATED)
Heparin Unfractionated: 0.41 IU/mL (ref 0.30–0.70)
Heparin Unfractionated: 0.45 IU/mL (ref 0.30–0.70)

## 2020-10-20 LAB — PROTIME-INR
INR: 1.1 (ref 0.8–1.2)
Prothrombin Time: 13.8 seconds (ref 11.4–15.2)

## 2020-10-20 MED ORDER — SODIUM CHLORIDE 0.9 % IV SOLN: INTRAVENOUS | Status: DC

## 2020-10-20 MED ORDER — MAGNESIUM HYDROXIDE 400 MG/5ML PO SUSP: 30.0000 mL | Freq: Every day | ORAL | Status: DC | PRN

## 2020-10-20 MED ORDER — MORPHINE SULFATE (PF) 2 MG/ML IV SOLN: 1.0000 mg | INTRAVENOUS | Status: DC | PRN

## 2020-10-20 MED ORDER — CALCIUM CARBONATE ANTACID 500 MG PO CHEW: 1.0000 | CHEWABLE_TABLET | Freq: Three times a day (TID) | ORAL | Status: DC | PRN

## 2020-10-20 MED ORDER — ACETAMINOPHEN 650 MG RE SUPP: 650.0000 mg | Freq: Four times a day (QID) | RECTAL | Status: DC | PRN

## 2020-10-20 MED ORDER — FUROSEMIDE 10 MG/ML IJ SOLN
40.0000 mg | Freq: Once | INTRAMUSCULAR | Status: AC
  Administered 2020-10-20: 40 mg via INTRAVENOUS
  Filled 2020-10-20: qty 4

## 2020-10-20 MED ORDER — HEPARIN BOLUS VIA INFUSION
3400.0000 [IU] | Freq: Once | INTRAVENOUS | Status: AC
  Administered 2020-10-20: 3400 [IU] via INTRAVENOUS
  Filled 2020-10-20: qty 3400

## 2020-10-20 MED ORDER — HEPARIN (PORCINE) 25000 UT/250ML-% IV SOLN
750.0000 [IU]/h | INTRAVENOUS | Status: DC
  Administered 2020-10-20: 650 [IU]/h via INTRAVENOUS
  Filled 2020-10-20 (×2): qty 250

## 2020-10-20 MED ORDER — TRAZODONE HCL 50 MG PO TABS: 25.0000 mg | ORAL_TABLET | Freq: Every evening | ORAL | Status: DC | PRN

## 2020-10-20 MED ORDER — ONDANSETRON HCL 4 MG/2ML IJ SOLN
4.0000 mg | Freq: Four times a day (QID) | INTRAMUSCULAR | Status: DC | PRN
  Administered 2020-10-20 – 2020-10-21 (×3): 4 mg via INTRAVENOUS
  Filled 2020-10-20 (×3): qty 2

## 2020-10-20 MED ORDER — METOPROLOL TARTRATE 25 MG PO TABS
25.0000 mg | ORAL_TABLET | Freq: Two times a day (BID) | ORAL | Status: DC
  Administered 2020-10-20 – 2020-10-23 (×7): 25 mg via ORAL
  Filled 2020-10-20 (×7): qty 1

## 2020-10-20 MED ORDER — ONDANSETRON HCL 4 MG PO TABS: 4.0000 mg | ORAL_TABLET | Freq: Four times a day (QID) | ORAL | Status: DC | PRN

## 2020-10-20 MED ORDER — METOPROLOL TARTRATE 25 MG PO TABS
12.5000 mg | ORAL_TABLET | Freq: Two times a day (BID) | ORAL | Status: DC
  Administered 2020-10-20: 12.5 mg via ORAL
  Filled 2020-10-20: qty 1

## 2020-10-20 MED ORDER — ADULT MULTIVITAMIN W/MINERALS CH
1.0000 | ORAL_TABLET | Freq: Every day | ORAL | Status: DC
  Administered 2020-10-20 – 2020-10-22 (×3): 1 via ORAL
  Filled 2020-10-20 (×3): qty 1

## 2020-10-20 MED ORDER — ASPIRIN EC 81 MG PO TBEC
81.0000 mg | DELAYED_RELEASE_TABLET | Freq: Every day | ORAL | Status: DC
  Administered 2020-10-20 – 2020-10-23 (×4): 81 mg via ORAL
  Filled 2020-10-20 (×4): qty 1

## 2020-10-20 MED ORDER — ISOSORBIDE MONONITRATE ER 30 MG PO TB24
15.0000 mg | ORAL_TABLET | Freq: Every day | ORAL | Status: DC
  Administered 2020-10-20 – 2020-10-23 (×4): 15 mg via ORAL
  Filled 2020-10-20 (×4): qty 1

## 2020-10-20 MED ORDER — NITROGLYCERIN 0.4 MG SL SUBL: 0.4000 mg | SUBLINGUAL_TABLET | SUBLINGUAL | Status: DC | PRN

## 2020-10-20 MED ORDER — VITAMIN D 25 MCG (1000 UNIT) PO TABS
1000.0000 [IU] | ORAL_TABLET | Freq: Every day | ORAL | Status: DC
  Administered 2020-10-20 – 2020-10-22 (×3): 1000 [IU] via ORAL
  Filled 2020-10-20 (×3): qty 1

## 2020-10-20 MED ORDER — ATORVASTATIN CALCIUM 80 MG PO TABS
80.0000 mg | ORAL_TABLET | Freq: Every day | ORAL | Status: DC
  Administered 2020-10-21 – 2020-10-23 (×3): 80 mg via ORAL
  Filled 2020-10-20 (×3): qty 1
  Filled 2020-10-20: qty 4

## 2020-10-20 MED ORDER — ACETAMINOPHEN 325 MG PO TABS
650.0000 mg | ORAL_TABLET | Freq: Four times a day (QID) | ORAL | Status: DC | PRN
  Administered 2020-10-21: 650 mg via ORAL
  Filled 2020-10-20: qty 2

## 2020-10-20 NOTE — ED Notes (Signed)
Pt voided a small amount. Brief changed for comfort.

## 2020-10-20 NOTE — ED Notes (Signed)
Pt assisted onto bedpan. Lunch tray at bedside.

## 2020-10-20 NOTE — Progress Notes (Signed)
ANTICOAGULATION CONSULT NOTE - Initial Consult  Pharmacy Consult for Heparin  Indication: chest pain/ACS  Allergies  Allergen Reactions   Hydrocodone Nausea Only   Meloxicam Nausea Only    Upset stomach   Other Other (See Comments)   Tramadol Hcl Nausea Only and Other (See Comments)    sweating   Penicillins Nausea Only and Rash   Sulfa Antibiotics Nausea Only, Hives and Rash    And rash    Patient Measurements: Height: 4\' 11"  (149.9 cm) Weight: 64.3 kg (141 lb 11.2 oz) IBW/kg (Calculated) : 43.2 Heparin Dosing Weight: 57.1 kg   Vital Signs: Temp: 98.3 F (36.8 C) (08/31 2347) Temp Source: Oral (08/31 2347) BP: 174/86 (09/01 0030) Pulse Rate: 86 (09/01 0030)  Labs: Recent Labs    10/19/20 2349  HGB 13.2  HCT 40.5  PLT 146*  LABPROT 13.8  INR 1.1  CREATININE 0.79  TROPONINIHS 779*    Estimated Creatinine Clearance: 28.2 mL/min (by C-G formula based on SCr of 0.79 mg/dL).   Medical History: Past Medical History:  Diagnosis Date   Osteoporosis    s/p vertebroplasty for fracutres x 3   PAF (paroxysmal atrial fibrillation) (HCC) 07/17/2019    Medications:  (Not in a hospital admission)   Assessment: Pharmacy consulted to dose heparin in this 87 yr old female admitted with ACS/NSTEMI.  No prior anticoag noted.  CrCl = 28.2 ml/min   Goal of Therapy:  Heparin level 0.3-0.7 units/ml Monitor platelets by anticoagulation protocol: Yes   Plan:  Will order heparin 3400 units IV X 1 bolus and start drip rate @ 650 units/hr. Will order HL 8 hrs after start of drip.   Ketrina Boateng D 10/20/2020,1:12 AM

## 2020-10-20 NOTE — ED Notes (Signed)
MD Fran Lowes made aware

## 2020-10-20 NOTE — ED Notes (Signed)
Pt provided cranberry/OJ mixture.

## 2020-10-20 NOTE — Progress Notes (Signed)
ANTICOAGULATION CONSULT NOTE  Pharmacy Consult for Heparin  Indication: chest pain/ACS  Allergies  Allergen Reactions   Hydrocodone Nausea Only   Meloxicam Nausea Only    Upset stomach   Other Other (See Comments)   Tramadol Hcl Nausea Only and Other (See Comments)    sweating   Penicillins Nausea Only and Rash   Sulfa Antibiotics Nausea Only, Hives and Rash    And rash    Patient Measurements: Height: 4\' 11"  (149.9 cm) Weight: 64.3 kg (141 lb 11.2 oz) IBW/kg (Calculated) : 43.2 Heparin Dosing Weight: 57.1 kg   Vital Signs: Temp: 97.8 F (36.6 C) (09/01 0947) Temp Source: Oral (09/01 0947) BP: 142/84 (09/01 1030) Pulse Rate: 80 (09/01 1030)  Labs: Recent Labs    10/19/20 2349 10/20/20 0107 10/20/20 0202 10/20/20 0619 10/20/20 1006  HGB 13.2  --   --  13.1  --   HCT 40.5  --   --  41.0  --   PLT 146*  --   --  148*  --   APTT  --  30  --   --   --   LABPROT 13.8  --   --   --   --   INR 1.1  --   --   --   --   HEPARINUNFRC  --   --   --   --  0.41  CREATININE 0.79  --   --  0.65  --   TROPONINIHS 779*  --  1,062* 6,555*  --      Estimated Creatinine Clearance: 28.2 mL/min (by C-G formula based on SCr of 0.65 mg/dL).   Medical History: Past Medical History:  Diagnosis Date   Osteoporosis    s/p vertebroplasty for fracutres x 3   PAF (paroxysmal atrial fibrillation) (HCC) 07/17/2019    Medications:  (Not in a hospital admission)  Assessment: Pharmacy consulted to dose heparin in this 87 yr old female admitted with ACS/NSTEMI.  No prior anticoag noted.   9/1 1006 HL 0.41   Goal of Therapy:  Heparin level 0.3-0.7 units/ml Monitor platelets by anticoagulation protocol: Yes   Plan:  Heparin level is therapeutic. Will continue heparin infusion at 650 units/hr. Recheck heparin level in 8 hours. CBC daily while on heparin. Plan to stop heparin infusion in 48-72 hours per cards.   08-30-1979, PharmD, BCPS 10/20/2020,11:03 AM

## 2020-10-20 NOTE — ED Notes (Signed)
Pt assisted onto bedpan.

## 2020-10-20 NOTE — ED Notes (Signed)
Pt voided only, no BM.

## 2020-10-20 NOTE — ED Notes (Signed)
Pt states that she is SHOB. Pt encouraged to breathe in through her nose, out through her mouth. Sats remain between 96-98%. Dr Fran Lowes made aware

## 2020-10-20 NOTE — ED Notes (Signed)
Phlebotomy called for trop collection

## 2020-10-20 NOTE — ED Notes (Signed)
Pt called out and states that she is having a hard time breathing, denies chest pain, Pt sats 87% on room air. Pt placed on 3L [place].

## 2020-10-20 NOTE — Progress Notes (Signed)
*  PRELIMINARY RESULTS* Echocardiogram 2D Echocardiogram has been performed.  Cristela Blue 10/20/2020, 12:24 PM

## 2020-10-20 NOTE — ED Notes (Signed)
Pt had BM. Assisted back to bed.

## 2020-10-20 NOTE — ED Notes (Signed)
Phlebotomy called for trop/hep level draw

## 2020-10-20 NOTE — ED Notes (Signed)
Pt resting comfortably in room. No complaints at this time.

## 2020-10-20 NOTE — ED Notes (Signed)
Breakfast tray at bedside, pt states she is not hungry at this time but to leave it to pick at.

## 2020-10-20 NOTE — ED Notes (Signed)
purwick in place, pt repositioned in bed

## 2020-10-20 NOTE — ED Notes (Signed)
Pt ambulated using IV pole and 1 assist to bedside commode.

## 2020-10-20 NOTE — ED Notes (Signed)
Pt moved onto hospital bed

## 2020-10-20 NOTE — ED Notes (Signed)
Pt calling out states she feels nauseous, pt is dry heaving in bed. Pt given emesis bag and zofran.

## 2020-10-20 NOTE — Consult Note (Signed)
[facility] Cardiology  CARDIOLOGY CONSULT NOTE  Patient ID: Rebekah Bridges MRN: [id number] DOB/AGE: 85/14/1917 85 y.o.  Admit date: 10/19/2020 Referring Physician Fran Lowes Primary Physician [facility] Primary Cardiologist  Reason for Consultation non-ST elevation myocardial infarction  HPI: 87 year old female referred for evaluation of non-ST elevation myocardial infarction.  Patient has a history of essential hypertension.  She was in her usual state of health until 10/19/2020 when she developed substernal chest discomfort.  She was brought to [facility] ED where ECG nondiagnostic ST elevations in leads I and aVL nonspecific T wave abnormalities in V5 and V6.  Ischial high-sensitivity troponin was 779.  Follow-up troponin is 1062 and 6555.  On arrival to the ED, chest pain improved to 1/10.  Patient was started on heparin bolus and drip.  This morning, patient denies chest pain.  Review of systems complete and found to be negative unless listed above     Past Medical History:  Diagnosis Date   Osteoporosis    s/p vertebroplasty for fracutres x 3   PAF (paroxysmal atrial fibrillation) (HCC) 07/17/2019    Past Surgical History:  Procedure Laterality Date   ABDOMINAL AORTOGRAM W/LOWER EXTREMITY N/A 05/26/2020   Procedure: ABDOMINAL AORTOGRAM W/LOWER EXTREMITY;  Surgeon: Cephus Shelling, MD;  Location: [facility] INVASIVE CV LAB;  Service: Cardiovascular;  Laterality: N/A;   BLADDER AUGMENTATION  1964   FEMORAL-POPLITEAL BYPASS GRAFT Right 06/27/2020   Procedure: RIGHT COOMMON FEMORAL TO ABOVE KNEE POPLITEAL ARTERY BYPASS GRAFT USING RIGHT GREATER SAPHENOUS VEIN;  Surgeon: Cephus Shelling, MD;  Location: [facility] OR;  Service: Vascular;  Laterality: Right;   SPINE SURGERY  2010   SPINE SURGERY  2011   SPINE SURGERY  2012   WOUND DEBRIDEMENT Right 06/27/2020   Procedure: RIGHT FOOT PARTIAL RESECTION FIRST METATARSAL/ PHALANGEAL JOINT, IMPLANT NON-BIOLOGICAL DRUG IMPLANT;  Surgeon: Edwin Cap, DPM;  Location:  [facility] OR;  Service: Podiatry;  Laterality: Right;    (Not in a hospital admission)  Social History   Socioeconomic History   Marital status: Widowed    Spouse name: Not on file   Number of children: Not on file   Years of education: Not on file   Highest education level: Not on file  Occupational History   Not on file  Tobacco Use   Smoking status: Never   Smokeless tobacco: Never  Vaping Use   Vaping Use: Never used  Substance and Sexual Activity   Alcohol use: Not Currently   Drug use: No   Sexual activity: Not on file  Other Topics Concern   Not on file  Social History Narrative   Not on file   Social Determinants of Health   Financial Resource Strain: Not on file  Food Insecurity: Not on file  Transportation Needs: Not on file  Physical Activity: Not on file  Stress: Not on file  Social Connections: Not on file  Intimate Partner Violence: Not on file    Family History  Problem Relation Age of Onset   Cancer Father    Learning disabilities Paternal Grandmother    Cancer Sister    Cancer Brother       Review of systems complete and found to be negative unless listed above      PHYSICAL EXAM  General: Well developed, well nourished, in no acute distress HEENT:  Normocephalic and atramatic Neck:  No JVD.  Lungs: Clear bilaterally to auscultation and percussion. Heart: HRRR . Normal S1 and S2 without gallops or murmurs.  Abdomen: Bowel  sounds are positive, abdomen soft and non-tender  Msk:  Back normal, normal gait. Normal strength and tone for age. Extremities: No clubbing, cyanosis or edema.   Neuro: Alert and oriented X 3. Psych:  Good affect, responds appropriately  Labs:   Lab Results  Component Value Date   WBC 4.8 10/20/2020   HGB 13.1 10/20/2020   HCT 41.0 10/20/2020   MCV 95.6 10/20/2020   PLT 148 (L) 10/20/2020    Recent Labs  Lab 10/19/20 2349 10/20/20 0619  NA 136 136  K 4.0 4.2  CL 101 102  CO2 29 27  BUN 24* 27*  CREATININE  0.79 0.65  CALCIUM 9.1 8.7*  PROT 7.4  --   BILITOT 0.6  --   ALKPHOS 97  --   ALT 10  --   AST 30  --   GLUCOSE 117* 124*   No results found for: CKTOTAL, CKMB, CKMBINDEX, TROPONINI  Lab Results  Component Value Date   CHOL 121 06/28/2020   CHOL 161 07/18/2019   CHOL 188 09/02/2013   Lab Results  Component Value Date   HDL 44 06/28/2020   HDL 48 07/18/2019   HDL 39.00 (L) 09/02/2013   Lab Results  Component Value Date   LDLCALC 64 06/28/2020   LDLCALC 100 (H) 07/18/2019   LDLCALC 70 09/02/2013   Lab Results  Component Value Date   TRIG 63 06/28/2020   TRIG 67 07/18/2019   TRIG 395.0 (H) 09/02/2013   Lab Results  Component Value Date   CHOLHDL 2.8 06/28/2020   CHOLHDL 3.4 07/18/2019   CHOLHDL 5 09/02/2013   Lab Results  Component Value Date   LDLDIRECT 136.4 09/22/2012   LDLDIRECT 123.7 09/02/2012      Radiology: DG Chest Port 1 View  Result Date: 10/20/2020 CLINICAL DATA:  Chest pain EXAM: PORTABLE CHEST 1 VIEW COMPARISON:  04/27/2010 FINDINGS: Increased interstitial markings. Mild bibasilar atelectasis. No pleural effusion or pneumothorax. The heart is normal in size.  Thoracic aortic atherosclerosis. Degenerative changes of the right shoulder with suspected chronic posttraumatic deformity involving the humeral neck. IMPRESSION: Mild bibasilar opacities, likely atelectasis. No evidence of acute cardiopulmonary disease. Electronically Signed   By: Charline Bills M.D.   On: 10/20/2020 00:08    EKG: Sinus rhythm with less than 1 mm ST elevation leads I and aVL  ASSESSMENT AND PLAN:   1.  Non-ST elevation myocardial infarction with elevated high-sensitivity troponin (779, 1062, 6555 ), chest pain now resolved, on heparin drip.  After lengthy discussion with patient, will pursue conservative management in light of her advanced age and defer cardiac catheterization at this time. 2.  Essential hypertension, systolic blood pressure mildly elevated, on metoprolol  tartrate  Recommendations  Add low-dose aspirin 81 mg daily Continue heparin drip for 48 to 72 hours Add long-acting nitrates, isosorbide mononitrate 30 mg daily Uptitrate metoprolol tartrate to 25 mg twice daily Review 2D echocardiogram Defer cardiac catheterization  Signed: Marcina Millard MD,PhD, [facility] 10/20/2020, 8:25 AM

## 2020-10-20 NOTE — H&P (Signed)
[place]   PATIENT NAME: Rebekah Bridges    MR#:  [id number]  DATE OF BIRTH:  Jun 23, 1915  DATE OF ADMISSION:  10/19/2020  PRIMARY CARE PHYSICIAN: Dorothey Baseman, MD   Patient is coming from: Home  REQUESTING/REFERRING PHYSICIAN: Loleta Rose, MD  CHIEF COMPLAINT:   Chief Complaint  Patient presents with  . Chest Pain    HISTORY OF PRESENT ILLNESS:  Rebekah Bridges is a 87 y.o. female who looks much younger than her chronologic age with medical history significant for paroxysmal atrial fibrillation and osteoporosis who presented who presented emergency room with acute onset of central chest pain felt as pressure and graded 8/10 in severity with no nausea or vomiting or diaphoresis.  She has mild lower extremity edema without worsening.  She denies any worsening leg pain beyond her usual with peripheral neuropathy.  No cough or wheezing or hemoptysis.  She has been belching per her grandson.  No fever or chills.  No dysuria, oliguria or hematuria or flank pain.  No bleeding diathesis.  ED Course: When she came to the ER blood pressure was 136/95 with otherwise normal vital signs. Labs revealed a BUN of 24 with otherwise unremarkable CMP.  High-sensitivity troponin I was 779 and later 1062 and CBC was within normal except for platelets of 146.  Coagulation profile was within normal.  Influenza antigens and COVID-19 PCR came back negative. EKG as reviewed by me : Showed normal sinus rhythm with rate 78 with poor R wave progression. Imaging:Chest ray showed mild bibasilar opacities likely atelectasis with no acute cardiopulmonary disease .  The patient was given IV heparin bolus and drip.  Dr. Albertha Ghee was was notified about the patient.  She will be admitted to an progressive unit bed for further evaluation and management. PAST MEDICAL HISTORY:   Past Medical History:  Diagnosis Date  . Osteoporosis    s/p vertebroplasty for fracutres x 3  . PAF (paroxysmal atrial fibrillation)  (HCC) 07/17/2019    PAST SURGICAL HISTORY:   Past Surgical History:  Procedure Laterality Date  . ABDOMINAL AORTOGRAM W/LOWER EXTREMITY N/A 05/26/2020   Procedure: ABDOMINAL AORTOGRAM W/LOWER EXTREMITY;  Surgeon: Cephus Shelling, MD;  Location: [facility] INVASIVE CV LAB;  Service: Cardiovascular;  Laterality: N/A;  . BLADDER AUGMENTATION  1964  . FEMORAL-POPLITEAL BYPASS GRAFT Right 06/27/2020   Procedure: RIGHT COOMMON FEMORAL TO ABOVE KNEE POPLITEAL ARTERY BYPASS GRAFT USING RIGHT GREATER SAPHENOUS VEIN;  Surgeon: Cephus Shelling, MD;  Location: [facility] OR;  Service: Vascular;  Laterality: Right;  . SPINE SURGERY  2010  . SPINE SURGERY  2011  . SPINE SURGERY  2012  . WOUND DEBRIDEMENT Right 06/27/2020   Procedure: RIGHT FOOT PARTIAL RESECTION FIRST METATARSAL/ PHALANGEAL JOINT, IMPLANT NON-BIOLOGICAL DRUG IMPLANT;  Surgeon: Edwin Cap, DPM;  Location: [facility] OR;  Service: Podiatry;  Laterality: Right;    SOCIAL HISTORY:   Social History   Tobacco Use  . Smoking status: Never  . Smokeless tobacco: Never  Substance Use Topics  . Alcohol use: Not Currently    FAMILY HISTORY:   Family History  Problem Relation Age of Onset  . Cancer Father   . Learning disabilities Paternal Grandmother   . Cancer Sister   . Cancer Brother     DRUG ALLERGIES:   Allergies  Allergen Reactions  . Hydrocodone Nausea Only  . Meloxicam Nausea Only    Upset stomach  . Other Other (See Comments)  . Tramadol Hcl Nausea Only  and Other (See Comments)    sweating  . Penicillins Nausea Only and Rash  . Sulfa Antibiotics Nausea Only, Hives and Rash    And rash    REVIEW OF SYSTEMS:   ROS As per history of present illness. All pertinent systems were reviewed above. Constitutional, HEENT, cardiovascular, respiratory, GI, GU, musculoskeletal, neuro, psychiatric, endocrine, integumentary and hematologic systems were reviewed and are otherwise negative/unremarkable except for positive findings mentioned  above in the HPI.   MEDICATIONS AT HOME:   Prior to Admission medications   Medication Sig Start Date End Date Taking? Authorizing Provider  beta carotene 67672 UNIT capsule Take 25,000 Units by mouth at bedtime.   Yes [provider]  cholecalciferol (VITAMIN D) 25 MCG (1000 UNIT) tablet Take 1,000 Units by mouth at bedtime.   Yes [provider]  metoprolol tartrate (LOPRESSOR) 25 MG tablet Take 1 tablet (25 mg total) by mouth 2 (two) times daily. Patient taking differently: Take 12.5 mg by mouth 2 (two) times daily. 06/30/20  Yes Setzer, Lynnell Jude, PA-C  Multiple Vitamin (MULTIVITAMIN WITH MINERALS) TABS tablet Take 1 tablet by mouth at bedtime. One-A-Day Multivitamin   Yes [provider]  mupirocin ointment (BACTROBAN) 2 % Apply 1 application topically daily. 07/07/20  Yes McDonald, Adam R, DPM  RED YEAST RICE EXTRACT PO Take 1,200 mg by mouth at bedtime.   Yes [provider]  aspirin 81 MG EC tablet Take 1 tablet (81 mg total) by mouth daily. Swallow whole. Patient not taking: No sig reported 06/30/20   Milinda Antis, PA-C  cadexomer iodine (IODOSORB) 0.9 % gel Apply 1 application topically daily as needed for wound care. 06/01/20   McDonald, Rachelle Hora, DPM  calcium carbonate (TUMS - DOSED IN MG ELEMENTAL CALCIUM) 500 MG chewable tablet Chew 1 tablet by mouth 3 (three) times daily as needed for indigestion or heartburn.    [provider]  ibuprofen (ADVIL) 200 MG tablet Take 200 mg by mouth every 8 (eight) hours as needed (back/knee pain).    [provider]  nystatin (MYCOSTATIN/NYSTOP) powder Apply 1 application topically 3 (three) times daily. Patient not taking: No sig reported 07/26/20   Setzer, Lynnell Jude, PA-C  oxyCODONE-acetaminophen (PERCOCET) 5-325 MG tablet Take 1 tablet by mouth every 4 (four) hours as needed for severe pain. Patient not taking: No sig reported 06/30/20 06/30/21  Setzer, Lynnell Jude, PA-C      VITAL SIGNS:  Blood  pressure (!) 146/97, pulse 92, temperature 98.3 F (36.8 C), temperature source Oral, resp. rate 18, height 4\' 11"  (1.499 m), weight 64.3 kg, SpO2 97 %.  PHYSICAL EXAMINATION:  Physical Exam  GENERAL:  87 y.o.-year-old Caucasian female patient lying in the bed with no acute distress.  EYES: Pupils equal, round, reactive to light and accommodation. No scleral icterus. Extraocular muscles intact.  HEENT: Head atraumatic, normocephalic. Oropharynx and nasopharynx clear.  NECK:  Supple, no jugular venous distention. No thyroid enlargement, no tenderness.  LUNGS: Normal breath sounds bilaterally, no wheezing, rales,rhonchi or crepitation. No use of accessory muscles of respiration.  CARDIOVASCULAR: Regular rate and rhythm, S1, S2 normal. No murmurs, rubs, or gallops.  ABDOMEN: Soft, nondistended, nontender. Bowel sounds present. No organomegaly or mass.  EXTREMITIES: No pedal edema, cyanosis, or clubbing.  NEUROLOGIC: Cranial nerves II through XII are intact. Muscle strength 5/5 in all extremities. Sensation intact. Gait not checked.  PSYCHIATRIC: The patient is alert and oriented x 3.  Normal affect and good eye contact. SKIN:  No obvious rash, lesion, or ulcer.   LABORATORY PANEL:   CBC Recent Labs  Lab 10/19/20 2349  WBC 4.1  HGB 13.2  HCT 40.5  PLT 146*   ------------------------------------------------------------------------------------------------------------------  Chemistries  Recent Labs  Lab 10/19/20 2349  NA 136  K 4.0  CL 101  CO2 29  GLUCOSE 117*  BUN 24*  CREATININE 0.79  CALCIUM 9.1  AST 30  ALT 10  ALKPHOS 97  BILITOT 0.6   ------------------------------------------------------------------------------------------------------------------  Cardiac Enzymes No results for input(s): TROPONINI in the last 168 hours. ------------------------------------------------------------------------------------------------------------------  RADIOLOGY:  DG Chest Port  1 View  Result Date: 10/20/2020 CLINICAL DATA:  Chest pain EXAM: PORTABLE CHEST 1 VIEW COMPARISON:  04/27/2010 FINDINGS: Increased interstitial markings. Mild bibasilar atelectasis. No pleural effusion or pneumothorax. The heart is normal in size.  Thoracic aortic atherosclerosis. Degenerative changes of the right shoulder with suspected chronic posttraumatic deformity involving the humeral neck. IMPRESSION: Mild bibasilar opacities, likely atelectasis. No evidence of acute cardiopulmonary disease. Electronically Signed   By: Charline Bills M.D.   On: 10/20/2020 00:08      IMPRESSION AND PLAN:  Active Problems:   NSTEMI (non-ST elevated myocardial infarction) (HCC)  1.  Non-ST elevation myocardial infarction. - The patient will be admitted to a progressive unit bed. - She will be placed on aspirin and high-dose statin therapy. - We will continue her IV heparin drip. - Pain management will be provided with as needed sublingual nitroglycerin and morphine sulfate. - 2D echo and cardiology consult will be obtained. - I notified Dr. Darrold Junker about the patient.  2.  Essential hypertension. - We will continue Lopressor.  DVT prophylaxis: IV heparin. Code Statu : The patient is DNR/DNI. Family Communication:  The plan of care was discussed in details with the patient (and family). I answered all questions. The patient agreed to proceed with the above mentioned plan. Further management will depend upon hospital course. Disposition Plan: Back to previous home environment Consults called: Cardiology All the records are reviewed and case discussed with ED provider.  Status is: Inpatient  Remains inpatient appropriate because:Ongoing active pain requiring inpatient pain management, Ongoing diagnostic testing needed not appropriate for outpatient work up, Unsafe d/c plan, IV treatments appropriate due to intensity of illness or inability to take PO, and Inpatient level of care appropriate due to  severity of illness  Dispo: The patient is from: Home              Anticipated d/c is to: Home              Patient currently is not medically stable to d/c.   Difficult to place patient No   TOTAL TIME TAKING CARE OF THIS PATIENT: 55 minutes.    Hannah Beat M.D on 10/20/2020 at 2:33 AM  [facility]   From 7 PM-7 AM, contact night-coverage [email or web address]  CC: Primary care physician; Dorothey Baseman, MD

## 2020-10-20 NOTE — ED Notes (Signed)
Provided daughter, [name], update

## 2020-10-20 NOTE — ED Notes (Signed)
Pt is not complaining of SHOB after receiving lasix.

## 2020-10-20 NOTE — ED Notes (Signed)
Dr. York Cerise made aware of critical troponin.

## 2020-10-20 NOTE — Progress Notes (Addendum)
Rebekah Bridges is a 87 y.o. female with medical history significant for paroxysmal atrial fibrillation and osteoporosis who presented with acute onset of central chest pain felt as pressure.    # NSTEMI --trop continues to trend up, >13,000 now.   --cont heparin gtt --cardiology consulted, and after discussing with pt and family, will defer heart cath for now and manage medically.  # Dyspnea and acute hypoxic respiratory failure --O2 sat noted to drop to 87% on room air noted by ED RN, and pt reported feeling short of breath.  Pt was placed on 3L [place].  Maybe her equivalent symptom of chest pain from her NSTEMI, or from some fluid overload, or both. --Trial IV lasix 40 mg x1 --Continue supplemental O2 to keep sats >=92%, wean as tolerated    Updated daughter-in-law Roselyn Bering (spokes person for the family) at the bedside today.  No charge note.

## 2020-10-20 NOTE — Progress Notes (Signed)
Called for possible STEMI. Patient with new onset chest pain. ECG reveals ST elevation I and aVL with mild elevation V5 and V6 c/w lateral STEMI. Chest pain improved to 1/10. Initial troponin 779. ED physician talked with patient and family members who agreed with initial conservative approach in light of advanced age ( 89 years ) and currently having minimal CP. Recommend heparin 48-72 h, possible cardiac cath if persistent or recurrent CP.

## 2020-10-20 NOTE — Progress Notes (Signed)
ANTICOAGULATION CONSULT NOTE  Pharmacy Consult for Heparin  Indication: chest pain/ACS  Allergies  Allergen Reactions   Hydrocodone Nausea Only   Meloxicam Nausea Only    Upset stomach   Other Other (See Comments)   Tramadol Hcl Nausea Only and Other (See Comments)    sweating   Penicillins Nausea Only and Rash   Sulfa Antibiotics Nausea Only, Hives and Rash    And rash    Patient Measurements: Height: 4\' 11"  (149.9 cm) Weight: 64.3 kg (141 lb 11.2 oz) IBW/kg (Calculated) : 43.2 Heparin Dosing Weight: 57.1 kg   Vital Signs: Temp: 97.8 F (36.6 C) (09/01 0947) Temp Source: Oral (09/01 0947) BP: 123/68 (09/01 1700) Pulse Rate: 88 (09/01 1700)  Labs: Recent Labs    10/19/20 2349 10/20/20 0107 10/20/20 0202 10/20/20 0619 10/20/20 1006 10/20/20 1123 10/20/20 1657  HGB 13.2  --   --  13.1  --   --   --   HCT 40.5  --   --  41.0  --   --   --   PLT 146*  --   --  148*  --   --   --   APTT  --  30  --   --   --   --   --   LABPROT 13.8  --   --   --   --   --   --   INR 1.1  --   --   --   --   --   --   HEPARINUNFRC  --   --   --   --  0.41  --  0.45  CREATININE 0.79  --   --  0.65  --   --   --   TROPONINIHS 779*  --  1,062* 6,555*  --  13,022*  --      Estimated Creatinine Clearance: 28.2 mL/min (by C-G formula based on SCr of 0.65 mg/dL).   Medical History: Past Medical History:  Diagnosis Date   Osteoporosis    s/p vertebroplasty for fracutres x 3   PAF (paroxysmal atrial fibrillation) (HCC) 07/17/2019    Medications:  (Not in a hospital admission)  Assessment: Pharmacy consulted to dose heparin in this 87 yr old female admitted with ACS/NSTEMI.  No prior anticoag noted.   9/1 1006 HL 0.41, therapeutic x1 9/1 1657 HL 0.45, therapeutic x2  Goal of Therapy:  Heparin level 0.3-0.7 units/ml Monitor platelets by anticoagulation protocol: Yes   Plan:  Heparin level is therapeutic. Will continue heparin infusion at 650 units/hr. Recheck heparin  level with AM labs. CBC daily while on heparin. Plan to stop heparin infusion in 48-72 hours per cards.   [birth year]-30-1979, PharmD 10/20/2020,5:24 PM

## 2020-10-21 ENCOUNTER — Encounter: Payer: Self-pay | Admitting: Family Medicine

## 2020-10-21 LAB — BASIC METABOLIC PANEL
Anion gap: 8 (ref 5–15)
BUN: 27 mg/dL — ABNORMAL HIGH (ref 8–23)
CO2: 29 mmol/L (ref 22–32)
Calcium: 8.2 mg/dL — ABNORMAL LOW (ref 8.9–10.3)
Chloride: 97 mmol/L — ABNORMAL LOW (ref 98–111)
Creatinine, Ser: 0.92 mg/dL (ref 0.44–1.00)
GFR, Estimated: 55 mL/min — ABNORMAL LOW (ref >60)
Glucose, Bld: 115 mg/dL — ABNORMAL HIGH (ref 70–99)
Potassium: 3.7 mmol/L (ref 3.5–5.1)
Sodium: 134 mmol/L — ABNORMAL LOW (ref 135–145)

## 2020-10-21 LAB — CBC
HCT: 39.1 % (ref 36.0–46.0)
Hemoglobin: 12.5 g/dL (ref 12.0–15.0)
MCH: 30 pg (ref 26.0–34.0)
MCHC: 32 g/dL (ref 30.0–36.0)
MCV: 94 fL (ref 80.0–100.0)
Platelets: 128 10*3/uL — ABNORMAL LOW (ref 150–400)
RBC: 4.16 MIL/uL (ref 3.87–5.11)
RDW: 14.6 % (ref 11.5–15.5)
WBC: 3.5 10*3/uL — ABNORMAL LOW (ref 4.0–10.5)
nRBC: 0 % (ref 0.0–0.2)

## 2020-10-21 LAB — PROCALCITONIN: Procalcitonin: <0.1 ng/mL

## 2020-10-21 LAB — MAGNESIUM: Magnesium: 1.9 mg/dL (ref 1.7–2.4)

## 2020-10-21 LAB — HEPARIN LEVEL (UNFRACTIONATED): Heparin Unfractionated: 0.24 IU/mL — ABNORMAL LOW (ref 0.30–0.70)

## 2020-10-21 LAB — TROPONIN I (HIGH SENSITIVITY): Troponin I (High Sensitivity): 16371 ng/L (ref <18)

## 2020-10-21 MED ORDER — HEPARIN BOLUS VIA INFUSION
850.0000 [IU] | Freq: Once | INTRAVENOUS | Status: AC
  Administered 2020-10-21: 850 [IU] via INTRAVENOUS
  Filled 2020-10-21: qty 850

## 2020-10-21 MED ORDER — HEPARIN SODIUM (PORCINE) 5000 UNIT/ML IJ SOLN
5000.0000 [IU] | Freq: Three times a day (TID) | INTRAMUSCULAR | Status: DC
  Administered 2020-10-21 – 2020-10-23 (×5): 5000 [IU] via SUBCUTANEOUS
  Filled 2020-10-21 (×5): qty 1

## 2020-10-21 NOTE — Progress Notes (Signed)
[facility] Cardiology  SUBJECTIVE: Patient laying in bed, denies chest pain or shortness of breath   Vitals:   10/21/20 0008 10/21/20 0100 10/21/20 0438 10/21/20 0637  BP: (!) 239/70 (!) 158/85 136/83 (!) 92/54  Pulse: 80 81 84 70  Resp: 18 18 19 19   Temp: 99.6 F (37.6 C) 98.5 F (36.9 C) (!) 101.9 F (38.8 C) 98.6 F (37 C)  TempSrc: Oral  Oral Oral  SpO2: 99% 99% 100% 99%  Weight:      Height:         Intake/Output Summary (Last 24 hours) at 10/21/2020 0753 Last data filed at 10/21/2020 0538 Gross per 24 hour  Intake 194.83 ml  Output 750 ml  Net -555.17 ml      PHYSICAL EXAM  General: Well developed, well nourished, in no acute distress HEENT:  Normocephalic and atramatic Neck:  No JVD.  Lungs: Clear bilaterally to auscultation and percussion. Heart: HRRR . Normal S1 and S2 without gallops or murmurs.  Abdomen: Bowel sounds are positive, abdomen soft and non-tender  Msk:  Back normal, normal gait. Normal strength and tone for age. Extremities: No clubbing, cyanosis or edema.   Neuro: Alert and oriented X 3. Psych:  Good affect, responds appropriately   LABS: Basic Metabolic Panel: Recent Labs    10/20/20 0619 10/21/20 0608  NA 136 134*  K 4.2 3.7  CL 102 97*  CO2 27 29  GLUCOSE 124* 115*  BUN 27* 27*  CREATININE 0.65 0.92  CALCIUM 8.7* 8.2*  MG  --  1.9   Liver Function Tests: Recent Labs    10/19/20 2349  AST 30  ALT 10  ALKPHOS 97  BILITOT 0.6  PROT 7.4  ALBUMIN 3.8   Recent Labs    10/19/20 2349  LIPASE 46   CBC: Recent Labs    10/20/20 0619 10/21/20 0608  WBC 4.8 3.5*  HGB 13.1 12.5  HCT 41.0 39.1  MCV 95.6 94.0  PLT 148* 128*   Cardiac Enzymes: No results for input(s): CKTOTAL, CKMB, CKMBINDEX, TROPONINI in the last 72 hours. BNP: Invalid input(s): POCBNP D-Dimer: No results for input(s): DDIMER in the last 72 hours. Hemoglobin A1C: No results for input(s): HGBA1C in the last 72 hours. Fasting Lipid Panel: No results for  input(s): CHOL, HDL, LDLCALC, TRIG, CHOLHDL, LDLDIRECT in the last 72 hours. Thyroid Function Tests: No results for input(s): TSH, T4TOTAL, T3FREE, THYROIDAB in the last 72 hours.  Invalid input(s): FREET3 Anemia Panel: No results for input(s): VITAMINB12, FOLATE, FERRITIN, TIBC, IRON, RETICCTPCT in the last 72 hours.  DG Chest Port 1 View  Result Date: 10/20/2020 CLINICAL DATA:  Chest pain EXAM: PORTABLE CHEST 1 VIEW COMPARISON:  04/27/2010 FINDINGS: Increased interstitial markings. Mild bibasilar atelectasis. No pleural effusion or pneumothorax. The heart is normal in size.  Thoracic aortic atherosclerosis. Degenerative changes of the right shoulder with suspected chronic posttraumatic deformity involving the humeral neck. IMPRESSION: Mild bibasilar opacities, likely atelectasis. No evidence of acute cardiopulmonary disease. Electronically Signed   By: 06/27/2010 M.D.   On: 10/20/2020 00:08   ECHOCARDIOGRAM COMPLETE  Result Date: 10/20/2020    ECHOCARDIOGRAM REPORT   Patient Name:   Rebekah Bridges Date of Exam: 10/20/2020 Medical Rec #:  12/20/2020       Height:       59.0 in Accession #:    [id number]      Weight:       141.7 lb Date of Birth:  1915/05/08  BSA:          1.593 m Patient Age:    87 years       BP:           136/83 mmHg Patient Gender: F               HR:           65 bpm. Exam Location:  [facility] Procedure: 2D Echo, Cardiac Doppler and Color Doppler Indications:     NSTEMI I21.4  History:         Patient has prior history of Echocardiogram examinations, most                  recent 06/29/2020. PAF.  Sonographer:     Cristela BlueJerry Hege Referring Phys:  [id number] JAN A MANSY Diagnosing Phys: Marcina MillardAlexander Amaan Meyer MD  Sonographer Comments: Suboptimal apical window. IMPRESSIONS  1. Left ventricular ejection fraction, by estimation, is 45 to 50%. The left ventricle has mildly decreased function. The left ventricle demonstrates regional wall motion abnormalities (see scoring diagram/findings for  description). Left ventricular diastolic parameters were normal.  2. Right ventricular systolic function is normal. The right ventricular size is normal.  3. The mitral valve is normal in structure. No evidence of mitral valve regurgitation. No evidence of mitral stenosis.  4. The aortic valve is normal in structure. Aortic valve regurgitation is not visualized. No aortic stenosis is present.  5. The inferior vena cava is normal in size with greater than 50% respiratory variability, suggesting right atrial pressure of 3 mmHg. FINDINGS  Left Ventricle: Left ventricular ejection fraction, by estimation, is 45 to 50%. The left ventricle has mildly decreased function. The left ventricle demonstrates regional wall motion abnormalities. The left ventricular internal cavity size was normal in size. There is no left ventricular hypertrophy. Left ventricular diastolic parameters were normal.  LV Wall Scoring: The apical lateral segment, apical septal segment, and apex are hypokinetic. Right Ventricle: The right ventricular size is normal. No increase in right ventricular wall thickness. Right ventricular systolic function is normal. Left Atrium: Left atrial size was normal in size. Right Atrium: Right atrial size was normal in size. Pericardium: There is no evidence of pericardial effusion. Mitral Valve: The mitral valve is normal in structure. No evidence of mitral valve regurgitation. No evidence of mitral valve stenosis. Tricuspid Valve: The tricuspid valve is normal in structure. Tricuspid valve regurgitation is not demonstrated. No evidence of tricuspid stenosis. Aortic Valve: The aortic valve is normal in structure. Aortic valve regurgitation is not visualized. Aortic regurgitation PHT measures 355 msec. No aortic stenosis is present. Aortic valve mean gradient measures 2.5 mmHg. Aortic valve peak gradient measures 3.9 mmHg. Aortic valve area, by VTI measures 2.73 cm. Pulmonic Valve: The pulmonic valve was normal in  structure. Pulmonic valve regurgitation is not visualized. No evidence of pulmonic stenosis. Aorta: The aortic root is normal in size and structure. Venous: The inferior vena cava is normal in size with greater than 50% respiratory variability, suggesting right atrial pressure of 3 mmHg. IAS/Shunts: No atrial level shunt detected by color flow Doppler.  LEFT VENTRICLE PLAX 2D LVIDd:         3.80 cm  Diastology LVIDs:         2.70 cm  LV e' medial:    4.03 cm/s LV PW:         1.30 cm  LV E/e' medial:  21.0 LV IVS:        0.90 cm  LV e' lateral:   4.03 cm/s LVOT diam:     2.00 cm  LV E/e' lateral: 21.0 LV SV:         48 LV SV Index:   30 LVOT Area:     3.14 cm  RIGHT VENTRICLE RV Basal diam:  3.00 cm LEFT ATRIUM           Index       RIGHT ATRIUM           Index LA diam:      3.60 cm 2.26 cm/m  RA Area:     13.40 cm LA Vol (A2C): 52.3 ml 32.83 ml/m RA Volume:   36.80 ml  23.10 ml/m LA Vol (A4C): 15.8 ml 9.92 ml/m  AORTIC VALVE                   PULMONIC VALVE AV Area (Vmax):    1.96 cm    RVOT Peak grad: 1 mmHg AV Area (Vmean):   2.09 cm AV Area (VTI):     2.73 cm AV Vmax:           99.15 cm/s AV Vmean:          65.150 cm/s AV VTI:            0.176 m AV Peak Grad:      3.9 mmHg AV Mean Grad:      2.5 mmHg LVOT Vmax:         62.00 cm/s LVOT Vmean:        43.400 cm/s LVOT VTI:          0.153 m LVOT/AV VTI ratio: 0.87 AI PHT:            355 msec  AORTA Ao Root diam: 3.73 cm MITRAL VALVE               TRICUSPID VALVE MV Area (PHT): 5.62 cm    TR Peak grad:   52.1 mmHg MV Decel Time: 135 msec    TR Vmax:        361.00 cm/s MV E velocity: 84.80 cm/s MV A velocity: 92.10 cm/s  SHUNTS MV E/A ratio:  0.92        Systemic VTI:  0.15 m                            Systemic Diam: 2.00 cm Marcina Millard MD Electronically signed by Marcina Millard MD Signature Date/Time: 10/20/2020/1:04:22 PM    Final      Echo LVEF 45 to 50% with apical wall akinesis  TELEMETRY: Sinus rhythm:  ASSESSMENT AND PLAN:  Active  Problems:   NSTEMI (non-ST elevated myocardial infarction) (HCC)    1.  Non-ST elevation myocardial infarction with elevated high-sensitivity troponin (779, 1062, 6555 ), chest pain now resolved, on heparin drip.  Had lengthy discussion with patient's daughter yesterday who agrees with conservative management in light of her advanced age and defer cardiac catheterization at this time. 2.  Essential hypertension, blood pressure well controlled on metoprolol tartrate   Recommendations   Continue low-dose aspirin 81 mg daily DC heparin Continue isosorbide mononitrate 30 mg daily Continue metoprolol tartrate 25 mg twice  Continue high-dose atorvastatin 80 mg daily Defer cardiac catheterization Consider discharge home later today or in a.m. Follow-up with me in 1 to 2 weeks  Sign off for now, please call if any questions   Marcina Millard, MD, PhD, [facility]  10/21/2020 7:53 AM

## 2020-10-21 NOTE — Progress Notes (Signed)
ANTICOAGULATION CONSULT NOTE  Pharmacy Consult for Heparin  Indication: chest pain/ACS  Allergies  Allergen Reactions   Hydrocodone Nausea Only   Meloxicam Nausea Only    Upset stomach   Other Other (See Comments)   Tramadol Hcl Nausea Only and Other (See Comments)    sweating   Penicillins Nausea Only and Rash   Sulfa Antibiotics Nausea Only, Hives and Rash    And rash    Patient Measurements: Height: 4\' 11"  (149.9 cm) Weight: 55.3 kg (121 lb 14.4 oz) IBW/kg (Calculated) : 43.2 Heparin Dosing Weight: 57.1 kg   Vital Signs: Temp: 98.6 F (37 C) (09/02 0637) Temp Source: Oral (09/02 0637) BP: 92/54 (09/02 0637) Pulse Rate: 70 (09/02 0637)  Labs: Recent Labs    10/19/20 2349 10/20/20 0107 10/20/20 0202 10/20/20 0619 10/20/20 1006 10/20/20 1123 10/20/20 1657 10/21/20 0608  HGB 13.2  --   --  13.1  --   --   --  12.5  HCT 40.5  --   --  41.0  --   --   --  39.1  PLT 146*  --   --  148*  --   --   --  128*  APTT  --  30  --   --   --   --   --   --   LABPROT 13.8  --   --   --   --   --   --   --   INR 1.1  --   --   --   --   --   --   --   HEPARINUNFRC  --   --   --   --  0.41  --  0.45 0.24*  CREATININE 0.79  --   --  0.65  --   --   --  0.92  TROPONINIHS 779*  --    < > 6,555*  --  13,022* 12/21/20*  --    < > = values in this interval not displayed.     Estimated Creatinine Clearance: 22.8 mL/min (by C-G formula based on SCr of 0.92 mg/dL).   Medical History: Past Medical History:  Diagnosis Date   Osteoporosis    s/p vertebroplasty for fracutres x 3   PAF (paroxysmal atrial fibrillation) (HCC) 07/17/2019    Medications:  Medications Prior to Admission  Medication Sig Dispense Refill Last Dose   beta carotene 07/19/2019 UNIT capsule Take 25,000 Units by mouth at bedtime.   10/19/2020   cholecalciferol (VITAMIN D) 25 MCG (1000 UNIT) tablet Take 1,000 Units by mouth at bedtime.   10/19/2020   metoprolol tartrate (LOPRESSOR) 25 MG tablet Take 1 tablet (25 mg  total) by mouth 2 (two) times daily. (Patient taking differently: Take 12.5 mg by mouth 2 (two) times daily.) 60 tablet 2 10/19/2020   Multiple Vitamin (MULTIVITAMIN WITH MINERALS) TABS tablet Take 1 tablet by mouth at bedtime. One-A-Day Multivitamin   10/19/2020   mupirocin ointment (BACTROBAN) 2 % Apply 1 application topically daily. 30 g 2 10/19/2020   RED YEAST RICE EXTRACT PO Take 1,200 mg by mouth at bedtime.   10/19/2020   aspirin 81 MG EC tablet Take 1 tablet (81 mg total) by mouth daily. Swallow whole. (Patient not taking: No sig reported) 30 tablet 11 Not Taking   cadexomer iodine (IODOSORB) 0.9 % gel Apply 1 application topically daily as needed for wound care. 40 g 0 prn at prn   calcium carbonate (TUMS -  DOSED IN MG ELEMENTAL CALCIUM) 500 MG chewable tablet Chew 1 tablet by mouth 3 (three) times daily as needed for indigestion or heartburn.   prn at prn   ibuprofen (ADVIL) 200 MG tablet Take 200 mg by mouth every 8 (eight) hours as needed (back/knee pain).   prn at prn   nystatin (MYCOSTATIN/NYSTOP) powder Apply 1 application topically 3 (three) times daily. (Patient not taking: No sig reported) 15 g 0 Not Taking   oxyCODONE-acetaminophen (PERCOCET) 5-325 MG tablet Take 1 tablet by mouth every 4 (four) hours as needed for severe pain. (Patient not taking: No sig reported) 8 tablet 0 Not Taking    Assessment: Pharmacy consulted to dose heparin in this 87 yr old female admitted with ACS/NSTEMI.  No prior anticoag noted.   9/1 1006 HL 0.41, therapeutic x1 9/1 1657 HL 0.45, therapeutic x2 9/2 0608 HL 0.24, SUBtherapeutic   Goal of Therapy:  Heparin level 0.3-0.7 units/ml Monitor platelets by anticoagulation protocol: Yes   Plan:  9/2:  HL @ 0608 = 0.24 Will order Heparin 850 units IV X 1 bolus and increase drip rate to 750 units/hr.  Will recheck HL 8 hrs after rate change.   Gabrelle Roca D, PharmD 10/21/2020,7:00 AM

## 2020-10-21 NOTE — Progress Notes (Signed)
PROGRESS NOTE    Rebekah Bridges  FGH:[id number] DOB: March 10, 1915 DOA: 10/19/2020 PCP: Dorothey Baseman, MD  [id number]   Assessment & Plan:   Active Problems:   NSTEMI (non-ST elevated myocardial infarction) (HCC)   Rebekah Bridges is a 87 y.o. female with medical history significant for paroxysmal atrial fibrillation and osteoporosis who presented with acute onset of central chest pain felt as pressure.     Fever --Pt had a fever to 101.9 early this morning.  No tachycardia, no leukocytosis. --ordered blood cx x2 --no source of infection, hold abx for now --monitor for fever  # NSTEMI --trop peaked at ~18,000 --s/p 48 hours of heparin gtt --chest pain resolved --cardiology consulted, and after discussing with pt and family, will defer heart cath for now and manage medically. Plan: --cont ASA, statin --cont Lopressor and Imdur   # Dyspnea and acute hypoxic respiratory failure --O2 sat noted to drop to 87% on room air noted by ED RN, and pt reported feeling short of breath.  Pt was placed on 3L [place].  Maybe her equivalent symptom of chest pain from her NSTEMI, or from some fluid overload, or both. --s/p IV lasix 40 mg x1 --Continue supplemental O2 to keep sats >=92%, wean as tolerated  HTN --cont Lopressor and Imdur (new)   DVT prophylaxis: Heparin SQ Code Status: Full code  Family Communication:  Level of care: Progressive Cardiac Dispo:   The patient is from: home Anticipated d/c is to: home Anticipated d/c date is: likely tomorrow Patient currently is not medically ready to d/c due to: monitor for fever, also need to wean O2   Subjective and Interval History:  Pt had a fever to 101.9 early this morning.    Pt denied chest pain, dyspnea, cough.  Said she always makes a lot of urine.   Objective: Vitals:   10/21/20 0637 10/21/20 0759 10/21/20 1202 10/21/20 1532  BP: (!) 92/54 103/65 105/73 (!) 91/57  Pulse: 70 74 70 70  Resp: 19 18 17 16   Temp: 98.6 F  (37 C) 98.5 F (36.9 C) 98.9 F (37.2 C) 100.2 F (37.9 C)  TempSrc: Oral Oral    SpO2: 99% 92% 100% 90%  Weight:      Height:        Intake/Output Summary (Last 24 hours) at 10/21/2020 1915 Last data filed at 10/21/2020 1539 Gross per 24 hour  Intake 412.55 ml  Output 600 ml  Net -187.45 ml   Filed Weights   10/19/20 2346 10/20/20 2053  Weight: 64.3 kg 55.3 kg    Examination:   Constitutional: NAD, AAOx3 HEENT: conjunctivae and lids normal, EOMI CV: No cyanosis.   RESP: normal respiratory effort, on 3L Extremities: No effusions, edema in BLE SKIN: warm, dry Neuro: II - XII grossly intact.   Psych: Normal mood and affect.  Appropriate judgement and reason   Data Reviewed: I have personally reviewed following labs and imaging studies  CBC: Recent Labs  Lab 10/19/20 2349 10/20/20 0619 10/21/20 0608  WBC 4.1 4.8 3.5*  HGB 13.2 13.1 12.5  HCT 40.5 41.0 39.1  MCV 96.7 95.6 94.0  PLT 146* 148* 128*   Basic Metabolic Panel: Recent Labs  Lab 10/19/20 2349 10/20/20 0619 10/21/20 0608  NA 136 136 134*  K 4.0 4.2 3.7  CL 101 102 97*  CO2 29 27 29   GLUCOSE 117* 124* 115*  BUN 24* 27* 27*  CREATININE 0.79 0.65 0.92  CALCIUM 9.1 8.7* 8.2*  MG  --   --  1.9   GFR: Estimated Creatinine Clearance: 22.8 mL/min (by C-G formula based on SCr of 0.92 mg/dL). Liver Function Tests: Recent Labs  Lab 10/19/20 2349  AST 30  ALT 10  ALKPHOS 97  BILITOT 0.6  PROT 7.4  ALBUMIN 3.8   Recent Labs  Lab 10/19/20 2349  LIPASE 46   No results for input(s): AMMONIA in the last 168 hours. Coagulation Profile: Recent Labs  Lab 10/19/20 2349  INR 1.1   Cardiac Enzymes: No results for input(s): CKTOTAL, CKMB, CKMBINDEX, TROPONINI in the last 168 hours. BNP (last 3 results) No results for input(s): PROBNP in the last 8760 hours. HbA1C: No results for input(s): HGBA1C in the last 72 hours. CBG: No results for input(s): GLUCAP in the last 168 hours. Lipid  Profile: No results for input(s): CHOL, HDL, LDLCALC, TRIG, CHOLHDL, LDLDIRECT in the last 72 hours. Thyroid Function Tests: No results for input(s): TSH, T4TOTAL, FREET4, T3FREE, THYROIDAB in the last 72 hours. Anemia Panel: No results for input(s): VITAMINB12, FOLATE, FERRITIN, TIBC, IRON, RETICCTPCT in the last 72 hours. Sepsis Labs: Recent Labs  Lab 10/21/20 0944  PROCALCITON <0.10    Recent Results (from the past 240 hour(s))  Resp Panel by RT-PCR (Flu A&B, Covid) Nasopharyngeal Swab     Status: None   Collection Time: 10/19/20 11:49 PM   Specimen: Nasopharyngeal Swab; Nasopharyngeal(NP) swabs in vial transport medium  Result Value Ref Range Status   SARS Coronavirus 2 by RT PCR NEGATIVE NEGATIVE Final    Comment: (NOTE) SARS-CoV-2 target nucleic acids are NOT DETECTED.  The SARS-CoV-2 RNA is generally detectable in upper respiratory specimens during the acute phase of infection. The lowest concentration of SARS-CoV-2 viral copies this assay can detect is 138 copies/mL. A negative result does not preclude SARS-Cov-2 infection and should not be used as the sole basis for treatment or other patient management decisions. A negative result may occur with  improper specimen collection/handling, submission of specimen other than nasopharyngeal swab, presence of viral mutation(s) within the areas targeted by this assay, and inadequate number of viral copies(<138 copies/mL). A negative result must be combined with clinical observations, patient history, and epidemiological information. The expected result is Negative.  Fact Sheet for Patients:  BloggerCourse.com  Fact Sheet for Healthcare Providers:  SeriousBroker.it  This test is no t yet approved or cleared by the [place] FDA and  has been authorized for detection and/or diagnosis of SARS-CoV-2 by FDA under an Emergency Use Authorization (EUA). This EUA will remain  in  effect (meaning this test can be used) for the duration of the COVID-19 declaration under Section 564(b)(1) of the Act, 21 U.S.C.section 360bbb-3(b)(1), unless the authorization is terminated  or revoked sooner.       Influenza A by PCR NEGATIVE NEGATIVE Final   Influenza B by PCR NEGATIVE NEGATIVE Final    Comment: (NOTE) The Xpert Xpress SARS-CoV-2/FLU/RSV plus assay is intended as an aid in the diagnosis of influenza from Nasopharyngeal swab specimens and should not be used as a sole basis for treatment. Nasal washings and aspirates are unacceptable for Xpert Xpress SARS-CoV-2/FLU/RSV testing.  Fact Sheet for Patients: BloggerCourse.com  Fact Sheet for Healthcare Providers: SeriousBroker.it  This test is not yet approved or cleared by the [place] FDA and has been authorized for detection and/or diagnosis of SARS-CoV-2 by FDA under an Emergency Use Authorization (EUA). This EUA will remain in effect (meaning this test can be used) for the duration of the COVID-19 declaration under Section  564(b)(1) of the Act, 21 U.S.C. section 360bbb-3(b)(1), unless the authorization is terminated or revoked.  Performed at [facility], [address] [place], [place] [address]       Radiology Studies: DG Chest [place] 1 View  Result Date: 10/20/2020 CLINICAL DATA:  Chest pain EXAM: PORTABLE CHEST 1 VIEW COMPARISON:  04/27/2010 FINDINGS: Increased interstitial markings. Mild bibasilar atelectasis. No pleural effusion or pneumothorax. The heart is normal in size.  Thoracic aortic atherosclerosis. Degenerative changes of the right shoulder with suspected chronic posttraumatic deformity involving the humeral neck. IMPRESSION: Mild bibasilar opacities, likely atelectasis. No evidence of acute cardiopulmonary disease. Electronically Signed   By: Charline BillsSriyesh  Krishnan M.D.   On: 10/20/2020 00:08   ECHOCARDIOGRAM COMPLETE  Result Date:  10/20/2020    ECHOCARDIOGRAM REPORT   Patient Name:   Rebekah Bridges Date of Exam: 10/20/2020 Medical Rec #:  [id number]       Height:       59.0 in Accession #:    [id number]      Weight:       141.7 lb Date of Birth:  1915/12/23       BSA:          1.593 m Patient Age:    89 years       BP:           136/83 mmHg Patient Gender: F               HR:           65 bpm. Exam Location:  [facility] Procedure: 2D Echo, Cardiac Doppler and Color Doppler Indications:     NSTEMI I21.4  History:         Patient has prior history of Echocardiogram examinations, most                  recent 06/29/2020. PAF.  Sonographer:     Cristela BlueJerry Hege Referring Phys:  [id number] JAN A MANSY Diagnosing Phys: Marcina MillardAlexander Paraschos MD  Sonographer Comments: Suboptimal apical window. IMPRESSIONS  1. Left ventricular ejection fraction, by estimation, is 45 to 50%. The left ventricle has mildly decreased function. The left ventricle demonstrates regional wall motion abnormalities (see scoring diagram/findings for description). Left ventricular diastolic parameters were normal.  2. Right ventricular systolic function is normal. The right ventricular size is normal.  3. The mitral valve is normal in structure. No evidence of mitral valve regurgitation. No evidence of mitral stenosis.  4. The aortic valve is normal in structure. Aortic valve regurgitation is not visualized. No aortic stenosis is present.  5. The inferior vena cava is normal in size with greater than 50% respiratory variability, suggesting right atrial pressure of 3 mmHg. FINDINGS  Left Ventricle: Left ventricular ejection fraction, by estimation, is 45 to 50%. The left ventricle has mildly decreased function. The left ventricle demonstrates regional wall motion abnormalities. The left ventricular internal cavity size was normal in size. There is no left ventricular hypertrophy. Left ventricular diastolic parameters were normal.  LV Wall Scoring: The apical lateral segment, apical septal segment, and  apex are hypokinetic. Right Ventricle: The right ventricular size is normal. No increase in right ventricular wall thickness. Right ventricular systolic function is normal. Left Atrium: Left atrial size was normal in size. Right Atrium: Right atrial size was normal in size. Pericardium: There is no evidence of pericardial effusion. Mitral Valve: The mitral valve is normal in structure. No evidence of mitral valve regurgitation. No evidence of mitral valve  stenosis. Tricuspid Valve: The tricuspid valve is normal in structure. Tricuspid valve regurgitation is not demonstrated. No evidence of tricuspid stenosis. Aortic Valve: The aortic valve is normal in structure. Aortic valve regurgitation is not visualized. Aortic regurgitation PHT measures 355 msec. No aortic stenosis is present. Aortic valve mean gradient measures 2.5 mmHg. Aortic valve peak gradient measures 3.9 mmHg. Aortic valve area, by VTI measures 2.73 cm. Pulmonic Valve: The pulmonic valve was normal in structure. Pulmonic valve regurgitation is not visualized. No evidence of pulmonic stenosis. Aorta: The aortic root is normal in size and structure. Venous: The inferior vena cava is normal in size with greater than 50% respiratory variability, suggesting right atrial pressure of 3 mmHg. IAS/Shunts: No atrial level shunt detected by color flow Doppler.  LEFT VENTRICLE PLAX 2D LVIDd:         3.80 cm  Diastology LVIDs:         2.70 cm  LV e' medial:    4.03 cm/s LV PW:         1.30 cm  LV E/e' medial:  21.0 LV IVS:        0.90 cm  LV e' lateral:   4.03 cm/s LVOT diam:     2.00 cm  LV E/e' lateral: 21.0 LV SV:         48 LV SV Index:   30 LVOT Area:     3.14 cm  RIGHT VENTRICLE RV Basal diam:  3.00 cm LEFT ATRIUM           Index       RIGHT ATRIUM           Index LA diam:      3.60 cm 2.26 cm/m  RA Area:     13.40 cm LA Vol (A2C): 52.3 ml 32.83 ml/m RA Volume:   36.80 ml  23.10 ml/m LA Vol (A4C): 15.8 ml 9.92 ml/m  AORTIC VALVE                    PULMONIC VALVE AV Area (Vmax):    1.96 cm    RVOT Peak grad: 1 mmHg AV Area (Vmean):   2.09 cm AV Area (VTI):     2.73 cm AV Vmax:           99.15 cm/s AV Vmean:          65.150 cm/s AV VTI:            0.176 m AV Peak Grad:      3.9 mmHg AV Mean Grad:      2.5 mmHg LVOT Vmax:         62.00 cm/s LVOT Vmean:        43.400 cm/s LVOT VTI:          0.153 m LVOT/AV VTI ratio: 0.87 AI PHT:            355 msec  AORTA Ao Root diam: 3.73 cm MITRAL VALVE               TRICUSPID VALVE MV Area (PHT): 5.62 cm    TR Peak grad:   52.1 mmHg MV Decel Time: 135 msec    TR Vmax:        361.00 cm/s MV E velocity: 84.80 cm/s MV A velocity: 92.10 cm/s  SHUNTS MV E/A ratio:  0.92        Systemic VTI:  0.15 m  Systemic Diam: 2.00 cm Marcina Millard MD Electronically signed by Marcina Millard MD Signature Date/Time: 10/20/2020/1:04:22 PM    Final      Scheduled Meds:  aspirin EC  81 mg Oral Daily   atorvastatin  80 mg Oral Daily   cholecalciferol  1,000 Units Oral QHS   isosorbide mononitrate  15 mg Oral Daily   metoprolol tartrate  25 mg Oral BID   multivitamin with minerals  1 tablet Oral QHS   Continuous Infusions:   LOS: 1 day     Darlin Priestly, MD [facility] If 7PM-7AM, please contact night-coverage 10/21/2020, 7:15 PM

## 2020-10-22 LAB — CBC
HCT: 36.1 % (ref 36.0–46.0)
Hemoglobin: 11.6 g/dL — ABNORMAL LOW (ref 12.0–15.0)
MCH: 30.6 pg (ref 26.0–34.0)
MCHC: 32.1 g/dL (ref 30.0–36.0)
MCV: 95.3 fL (ref 80.0–100.0)
Platelets: 123 10*3/uL — ABNORMAL LOW (ref 150–400)
RBC: 3.79 MIL/uL — ABNORMAL LOW (ref 3.87–5.11)
RDW: 14.6 % (ref 11.5–15.5)
WBC: 3.3 10*3/uL — ABNORMAL LOW (ref 4.0–10.5)
nRBC: 0 % (ref 0.0–0.2)

## 2020-10-22 LAB — BASIC METABOLIC PANEL
Anion gap: 5 (ref 5–15)
BUN: 32 mg/dL — ABNORMAL HIGH (ref 8–23)
CO2: 32 mmol/L (ref 22–32)
Calcium: 8.6 mg/dL — ABNORMAL LOW (ref 8.9–10.3)
Chloride: 100 mmol/L (ref 98–111)
Creatinine, Ser: 0.84 mg/dL (ref 0.44–1.00)
GFR, Estimated: >60 mL/min (ref >60)
Glucose, Bld: 89 mg/dL (ref 70–99)
Potassium: 4.2 mmol/L (ref 3.5–5.1)
Sodium: 137 mmol/L (ref 135–145)

## 2020-10-22 LAB — MAGNESIUM: Magnesium: 2.1 mg/dL (ref 1.7–2.4)

## 2020-10-22 LAB — PROCALCITONIN: Procalcitonin: <0.1 ng/mL

## 2020-10-22 MED ORDER — ATORVASTATIN CALCIUM 80 MG PO TABS: 80.0000 mg | ORAL_TABLET | Freq: Every day | ORAL | 0 refills | Status: AC

## 2020-10-22 MED ORDER — ISOSORBIDE MONONITRATE ER 30 MG PO TB24: 15.0000 mg | ORAL_TABLET | Freq: Every day | ORAL | 0 refills | Status: AC

## 2020-10-22 NOTE — Evaluation (Signed)
Physical Therapy Evaluation Patient Details Name: Rebekah Bridges MRN: [id number] DOB: 1915-12-01 Today's Date: 10/22/2020   History of Present Illness  Pt is a 87 y/o F admitted on 10/19/20 with c/c of acute onset of central chest pain/pressure. Pt also with fever. Pt being treated conservatively for NSTEMI. PMH: paroxysmal a-fib, osteoporosis  Clinical Impression  Pt seen for PT evaluation with pt agreeable to tx. Pt requires min assist overall for transfers & gait into hallway with RW, supervision for bed mobility. Pt does require 1L/min via nasal cannula to maintain SpO2 >/= 88% throughout session. Pt report she's from ILF but case manager states ALF. If pt is from ILF she will need STR prior to return home alone, if she is from ALF, anticipate she can return there with HHPT f/u - notified team. Will continue to follow pt acutely to progress mobility as able.     Follow Up Recommendations SNF;Supervision for mobility/OOB    Equipment Recommendations  None recommended by PT (TBD in next venue)    Recommendations for Other Services       Precautions / Restrictions Precautions Precautions: Fall Restrictions Weight Bearing Restrictions: No      Mobility  Bed Mobility Overal bed mobility: Needs Assistance Bed Mobility: Supine to Sit     Supine to sit: Supervision;HOB elevated     General bed mobility comments: use of bed rails    Transfers Overall transfer level: Needs assistance Equipment used: Rolling walker (2 wheeled) Transfers: Sit to/from [employer] Sit to Stand: Min assist Stand pivot transfers: Min assist;Min guard (min assist fade to CGA, stand pivot bed>recliner then recliner<>BSC)       General transfer comment: cuing to push to standing with fair return demo, min assist to power to standing  Ambulation/Gait Ambulation/Gait assistance: Min assist Gait Distance (Feet): 120 Feet Assistive device: Rolling walker (2 wheeled) Gait  Pattern/deviations: Decreased stride length;Decreased step length - right;Decreased step length - left Gait velocity: decreased      Stairs            Wheelchair Mobility    Modified Rankin (Stroke Patients Only)       Balance Overall balance assessment: Needs assistance Sitting-balance support: Feet supported;Bilateral upper extremity supported Sitting balance-Leahy Scale: Good     Standing balance support: Bilateral upper extremity supported;During functional activity Standing balance-Leahy Scale: Poor Standing balance comment: requires BUE support on RW                             Pertinent Vitals/Pain Pain Assessment: No/denies pain    Home Living Family/patient expects to be discharged to::  (unsure of ILF vs ALF at [facility])               Home Equipment:  (rollator)      Prior Function Level of Independence: Independent with assistive device(s)         Comments: Reports mod I with rollator, denies falls in the past 6 months     Hand Dominance        Extremity/Trunk Assessment   Upper Extremity Assessment Upper Extremity Assessment: Generalized weakness    Lower Extremity Assessment Lower Extremity Assessment: Generalized weakness       Communication   Communication: HOH  Cognition Arousal/Alertness: Awake/alert Behavior During Therapy: WFL for tasks assessed/performed Overall Cognitive Status: Within Functional Limits for tasks assessed  General Comments General comments (skin integrity, edema, etc.): Pt received on room air with SpO2 85-87%, placed on 1L/min via nasal cannula & SpO2 remained 88% or higher on 1L throughout remainder of session. Pt with continent void on [facility] & performs peri hygiene with assistance to hold/open wipes.    Exercises     Assessment/Plan    PT Assessment Patient needs continued PT services  PT Problem List Decreased  strength;Decreased mobility;Decreased activity tolerance;Decreased balance;Cardiopulmonary status limiting activity       PT Treatment Interventions Therapeutic activities;DME instruction;Modalities;Therapeutic exercise;Gait training;Patient/family education;Balance training;Functional mobility training;Neuromuscular re-education;Manual techniques    PT Goals (Current goals can be found in the Care Plan section)  Acute Rehab PT Goals Patient Stated Goal: get better, more therapy before going home alone PT Goal Formulation: With patient Time For Goal Achievement: 10/22/20 Potential to Achieve Goals: Good    Frequency Min 2X/week   Barriers to discharge Decreased caregiver support Lives alone    Co-evaluation               AM-PAC PT "6 Clicks" Mobility  Outcome Measure Help needed turning from your back to your side while in a flat bed without using bedrails?: A Little Help needed moving from lying on your back to sitting on the side of a flat bed without using bedrails?: A Little Help needed moving to and from a bed to a chair (including a wheelchair)?: A Little Help needed standing up from a chair using your arms (e.g., wheelchair or bedside chair)?: A Little Help needed to walk in hospital room?: A Little Help needed climbing 3-5 steps with a railing? : A Lot 6 Click Score: 17    End of Session Equipment Utilized During Treatment: Oxygen;Gait belt Activity Tolerance: Patient tolerated treatment well Patient left: in chair;with call bell/phone within reach Nurse Communication: Mobility status (O2) PT Visit Diagnosis: Muscle weakness (generalized) (M62.81);Unsteadiness on feet (R26.81)    Time: 6812-7517 PT Time Calculation (min) (ACUTE ONLY): 27 min   Charges:   PT Evaluation $PT Eval Low Complexity: 1 Low PT Treatments $Therapeutic Activity: 8-22 mins        Aleda Grana, PT, DPT 10/22/20, 2:09 PM   Sandi Mariscal 10/22/2020, 2:08 PM

## 2020-10-22 NOTE — Plan of Care (Signed)
  Problem: Education: Goal: Knowledge of General Education information will improve Description Including pain rating scale, medication(s)/side effects and non-pharmacologic comfort measures Outcome: Progressing   Problem: Health Behavior/Discharge Planning: Goal: Ability to manage health-related needs will improve Outcome: Progressing   

## 2020-10-22 NOTE — Progress Notes (Signed)
PROGRESS NOTE    Rebekah Bridges  GEZ:[id number] DOB: [birth year] DOA: 10/19/2020 PCP: Dorothey Baseman, MD  [id number]   Assessment & Plan:   Active Problems:   NSTEMI (non-ST elevated myocardial infarction) (HCC)   Rebekah Bridges is a 87 y.o. female with medical history significant for paroxysmal atrial fibrillation and osteoporosis who presented with acute onset of central chest pain felt as pressure.     Fever isolated --Pt had a fever to 101.9 early morning 9/2.  No tachycardia, no leukocytosis. --ordered blood cx x2 --no source of infection, hold abx for now --monitor for fever  # NSTEMI --trop peaked at ~18,000 --s/p 48 hours of heparin gtt --chest pain resolved --cardiology consulted, and after discussing with pt and family, will defer heart cath for now and manage medically. Plan: --cont ASA, statin --cont Lopressor and Imdur (new)   # Dyspnea and acute hypoxic respiratory failure --O2 sat noted to drop to 87% on room air noted by ED RN, and pt reported feeling short of breath.  Pt was placed on 3L [place].  Maybe her equivalent symptom of chest pain from her NSTEMI, or from some fluid overload, or both. --s/p IV lasix 40 mg x1 --currently needs 2L O2 while up and walking --will discharge home with 2L O2  HTN --cont Lopressor and Imdur (new)  Mildly reduced systolic function, LVEF 45-50% --per Echo, also with regional wall motion abnormalities   DVT prophylaxis: Heparin SQ Code Status: Full code  Family Communication: daughter-in-law [name] updated on the phone today Level of care: Progressive Cardiac Dispo:   The patient is from: ALF Anticipated d/c is to: ALF Anticipated d/c date is: tomorrow Patient currently is medically ready to d/c    Subjective and Interval History:  Pt reported feeling good and back to her baseline.  Pt said her mobility is back to baseline as well.  Denied dyspnea, though required 2L O2 with walking.   Objective: Vitals:    10/22/20 0405 10/22/20 0741 10/22/20 1144 10/22/20 1616  BP: 125/75 126/72 103/64 109/62  Pulse: 65 64 66 63  Resp: 18 17 17 17   Temp: 98.2 F (36.8 C) 98 F (36.7 C) 98.4 F (36.9 C) 98.5 F (36.9 C)  TempSrc: Oral     SpO2: 100% 100% 99% 100%  Weight:      Height:        Intake/Output Summary (Last 24 hours) at 10/22/2020 1739 Last data filed at 10/22/2020 0400 Gross per 24 hour  Intake --  Output 150 ml  Net -150 ml   Filed Weights   10/19/20 2346 10/20/20 2053  Weight: 64.3 kg 55.3 kg    Examination:   Constitutional: NAD, AAOx3 HEENT: conjunctivae and lids normal, EOMI CV: No cyanosis.   RESP: normal respiratory effort, on 2L Extremities: No effusions, edema in BLE SKIN: warm, dry Neuro: II - XII grossly intact.   Psych: Normal mood and affect.  Appropriate judgement and reason   Data Reviewed: I have personally reviewed following labs and imaging studies  CBC: Recent Labs  Lab 10/19/20 2349 10/20/20 0619 10/21/20 0608 10/22/20 0530  WBC 4.1 4.8 3.5* 3.3*  HGB 13.2 13.1 12.5 11.6*  HCT 40.5 41.0 39.1 36.1  MCV 96.7 95.6 94.0 95.3  PLT 146* 148* 128* 123*   Basic Metabolic Panel: Recent Labs  Lab 10/19/20 2349 10/20/20 0619 10/21/20 0608 10/22/20 0530  NA 136 136 134* 137  K 4.0 4.2 3.7 4.2  CL 101 102 97* 100  CO2 29 27 29  32  GLUCOSE 117* 124* 115* 89  BUN 24* 27* 27* 32*  CREATININE 0.79 0.65 0.92 0.84  CALCIUM 9.1 8.7* 8.2* 8.6*  MG  --   --  1.9 2.1   GFR: Estimated Creatinine Clearance: 25 mL/min (by C-G formula based on SCr of 0.84 mg/dL). Liver Function Tests: Recent Labs  Lab 10/19/20 2349  AST 30  ALT 10  ALKPHOS 97  BILITOT 0.6  PROT 7.4  ALBUMIN 3.8   Recent Labs  Lab 10/19/20 2349  LIPASE 46   No results for input(s): AMMONIA in the last 168 hours. Coagulation Profile: Recent Labs  Lab 10/19/20 2349  INR 1.1   Cardiac Enzymes: No results for input(s): CKTOTAL, CKMB, CKMBINDEX, TROPONINI in the last 168  hours. BNP (last 3 results) No results for input(s): PROBNP in the last 8760 hours. HbA1C: No results for input(s): HGBA1C in the last 72 hours. CBG: No results for input(s): GLUCAP in the last 168 hours. Lipid Profile: No results for input(s): CHOL, HDL, LDLCALC, TRIG, CHOLHDL, LDLDIRECT in the last 72 hours. Thyroid Function Tests: No results for input(s): TSH, T4TOTAL, FREET4, T3FREE, THYROIDAB in the last 72 hours. Anemia Panel: No results for input(s): VITAMINB12, FOLATE, FERRITIN, TIBC, IRON, RETICCTPCT in the last 72 hours. Sepsis Labs: Recent Labs  Lab 10/21/20 0944 10/22/20 0530  PROCALCITON <0.10 <0.10    Recent Results (from the past 240 hour(s))  Resp Panel by RT-PCR (Flu A&B, Covid) Nasopharyngeal Swab     Status: None   Collection Time: 10/19/20 11:49 PM   Specimen: Nasopharyngeal Swab; Nasopharyngeal(NP) swabs in vial transport medium  Result Value Ref Range Status   SARS Coronavirus 2 by RT PCR NEGATIVE NEGATIVE Final    Comment: (NOTE) SARS-CoV-2 target nucleic acids are NOT DETECTED.  The SARS-CoV-2 RNA is generally detectable in upper respiratory specimens during the acute phase of infection. The lowest concentration of SARS-CoV-2 viral copies this assay can detect is 138 copies/mL. A negative result does not preclude SARS-Cov-2 infection and should not be used as the sole basis for treatment or other patient management decisions. A negative result may occur with  improper specimen collection/handling, submission of specimen other than nasopharyngeal swab, presence of viral mutation(s) within the areas targeted by this assay, and inadequate number of viral copies(<138 copies/mL). A negative result must be combined with clinical observations, patient history, and epidemiological information. The expected result is Negative.  Fact Sheet for Patients:  10/21/20  Fact Sheet for Healthcare Providers:   BloggerCourse.com  This test is no t yet approved or cleared by the [employer] FDA and  has been authorized for detection and/or diagnosis of SARS-CoV-2 by FDA under an Emergency Use Authorization (EUA). This EUA will remain  in effect (meaning this test can be used) for the duration of the COVID-19 declaration under Section 564(b)(1) of the Act, 21 U.S.C.section 360bbb-3(b)(1), unless the authorization is terminated  or revoked sooner.       Influenza A by PCR NEGATIVE NEGATIVE Final   Influenza B by PCR NEGATIVE NEGATIVE Final    Comment: (NOTE) The Xpert Xpress SARS-CoV-2/FLU/RSV plus assay is intended as an aid in the diagnosis of influenza from Nasopharyngeal swab specimens and should not be used as a sole basis for treatment. Nasal washings and aspirates are unacceptable for Xpert Xpress SARS-CoV-2/FLU/RSV testing.  Fact Sheet for Patients: [place]  Fact Sheet for Healthcare Providers: BloggerCourse.com  This test is not yet approved or cleared by the [employer]  States FDA and has been authorized for detection and/or diagnosis of SARS-CoV-2 by FDA under an Emergency Use Authorization (EUA). This EUA will remain in effect (meaning this test can be used) for the duration of the COVID-19 declaration under Section 564(b)(1) of the Act, 21 U.S.C. section 360bbb-3(b)(1), unless the authorization is terminated or revoked.  Performed at [facility], [address] [place], [place] [address]   Culture, blood (Routine X 2) w Reflex to ID Panel     Status: None (Preliminary result)   Collection Time: 10/21/20  9:44 AM   Specimen: BLOOD  Result Value Ref Range Status   Specimen Description BLOOD LH  Final   Special Requests   Final    BOTTLES DRAWN AEROBIC AND ANAEROBIC Blood Culture adequate volume   Culture   Final    NO GROWTH < 24 HOURS Performed at [facility], [address] [place], [place] [address]    Report Status PENDING  Incomplete  Culture, blood (Routine X 2) w Reflex to ID Panel     Status: None (Preliminary result)   Collection Time: 10/21/20  9:44 AM   Specimen: BLOOD  Result Value Ref Range Status   Specimen Description BLOOD LA  Final   Special Requests   Final    BOTTLES DRAWN AEROBIC AND ANAEROBIC Blood Culture adequate volume   Culture   Final    NO GROWTH < 24 HOURS Performed at [facility], [address], [place], [place] [address]    Report Status PENDING  Incomplete      Radiology Studies: No results found.   Scheduled Meds:  aspirin EC  81 mg Oral Daily   atorvastatin  80 mg Oral Daily   cholecalciferol  1,000 Units Oral QHS   heparin injection (subcutaneous)  5,000 Units Subcutaneous Q8H   isosorbide mononitrate  15 mg Oral Daily   metoprolol tartrate  25 mg Oral BID   multivitamin with minerals  1 tablet Oral QHS   Continuous Infusions:   LOS: 2 days     Darlin Priestly, MD [facility] If 7PM-7AM, please contact night-coverage 10/22/2020, 5:39 PM

## 2020-10-23 LAB — BASIC METABOLIC PANEL
Anion gap: 5 (ref 5–15)
BUN: 30 mg/dL — ABNORMAL HIGH (ref 8–23)
CO2: 30 mmol/L (ref 22–32)
Calcium: 8.4 mg/dL — ABNORMAL LOW (ref 8.9–10.3)
Chloride: 100 mmol/L (ref 98–111)
Creatinine, Ser: 0.79 mg/dL (ref 0.44–1.00)
GFR, Estimated: >60 mL/min (ref >60)
Glucose, Bld: 88 mg/dL (ref 70–99)
Potassium: 4.3 mmol/L (ref 3.5–5.1)
Sodium: 135 mmol/L (ref 135–145)

## 2020-10-23 LAB — CBC
HCT: 33.9 % — ABNORMAL LOW (ref 36.0–46.0)
Hemoglobin: 11.1 g/dL — ABNORMAL LOW (ref 12.0–15.0)
MCH: 31.9 pg (ref 26.0–34.0)
MCHC: 32.7 g/dL (ref 30.0–36.0)
MCV: 97.4 fL (ref 80.0–100.0)
Platelets: 121 10*3/uL — ABNORMAL LOW (ref 150–400)
RBC: 3.48 MIL/uL — ABNORMAL LOW (ref 3.87–5.11)
RDW: 14.7 % (ref 11.5–15.5)
WBC: 3 10*3/uL — ABNORMAL LOW (ref 4.0–10.5)
nRBC: 0.7 % — ABNORMAL HIGH (ref 0.0–0.2)

## 2020-10-23 LAB — MAGNESIUM: Magnesium: 2.1 mg/dL (ref 1.7–2.4)

## 2020-10-23 NOTE — Progress Notes (Signed)
Patient d/c to [facility] transported by EMS, discharge packet sent via ems staff. No c/o pain or shortness of breath at d/c. Attempted report x 2 no answer.  Aleea Hendry, Kae Heller, RN

## 2020-10-23 NOTE — TOC Progression Note (Signed)
Transition of Care [facility]) - Progression Note    Patient Details  Name: Rebekah Bridges MRN: [id number] Date of Birth: 1915/03/16  Transition of Care [facility]) CM/SW Contact  Bing Quarry, RN Phone Number: 10/23/2020, 12:22 PM  Clinical Narrative:  [name] is able to accept patient back today if everything is set up. [employer] is setting up home oxygen DME at facility and patient will be transported via [facility] back to facility. Spoke with daughter Rebekah Bridges to update her regarding status of today's plan. Communicated with Bri at [facility] as well. Informed provider and Unit RN of status.  Will contact [facility] after oxygen is confirmed set up at [facility] Gabriel Cirri RN CM          Expected Discharge Plan and Services           Expected Discharge Date: 10/23/20                                     Social Determinants of Health (SDOH) Interventions    Readmission Risk Interventions Readmission Risk Prevention Plan 06/30/2020  Post Dischage Appt Complete  Medication Screening Complete  Transportation Screening Complete  Some recent data might be hidden

## 2020-10-23 NOTE — TOC Transition Note (Signed)
Transition of Care [facility]) - CM/SW Discharge Note   Patient Details  Name: Rebekah Bridges MRN: [id number] Date of Birth: 06-05-1915  Transition of Care [facility]) CM/SW Contact:  Rebekah Quarry, RN Phone Number: 10/23/2020, 1:43 PM   Clinical Narrative:  Patient to be discharged back to [facility] ALF facility. [employer] set up DME home oxygen and is complete. DC Summary faxed to [facility] coordinator at the facility. All medications obtainable today per Rebekah Bridges. Report to be called to [facility] and phone number given to [facility].   Spoke with daughter and she is present at facility at this time. [facility] transport arranged with O2 2L/[place] for transport. Rebekah Bridges's contact number given for transport/location of ALF Room 24. Transport forms printed to [employer].  Rebekah Cirri RN CM   Final next level of care: Assisted Living Barriers to Discharge: Barriers Resolved   Patient Goals and CMS Choice        Discharge Placement                  Name of family member notified: Daugther Rebekah Bridges Patient and family notified of of transfer: 10/23/20  Discharge Plan and Services                DME Arranged: N/A DME Agency: NA       HH Arranged: PT HH Agency:  [facility]ah Bridges ALF to arrange [facility]) Date HH Agency Contacted: 10/23/20 Time HH Agency Contacted: 1342 Representative spoke with at [facility] Agency: Rebekah Bridges at [place] [facility] facility which will set up their HH.  Social Determinants of Health (SDOH) Interventions     Readmission Risk Interventions Readmission Risk Prevention Plan 06/30/2020  Post Dischage Appt Complete  Medication Screening Complete  Transportation Screening Complete  Some recent data might be hidden

## 2020-10-23 NOTE — Discharge Summary (Signed)
Physician Discharge Summary   Rebekah Bridges  female DOB: [birth year]  HYW:[id number]  PCP: Dorothey Baseman, MD  Admit date: 10/19/2020 Discharge date: 10/23/2020  Admitted From: ALF Disposition:  ALF Home Health: Yes CODE STATUS: Full code  Discharge Instructions     Discharge instructions   Complete by: As directed    You have had incomplete heart attack, and cardiologist Dr. Darrold Junker had started you on some heart healthy medications.  Please take them as directed.  Please follow up with Dr. Darrold Junker 1-2 weeks after discharge.  Currently, you need about 2 liters of extra oxygen when you are up and walking around, so we have ordered you oxygen to go home with.  Please follow up with PCP to see when you may not need it anymore.   Dr. Darlin Priestly [facility] Course:  For full details, please see H&P, progress notes, consult notes and ancillary notes.  Briefly,  Rebekah Bridges is a 87 y.o. female with medical history significant for paroxysmal atrial fibrillation and osteoporosis who presented with acute onset of central chest pain felt as pressure.     # NSTEMI --trop peaked at ~18,000 --s/p 48 hours of heparin gtt --chest pain resolved --cardiology consulted, and after discussing with pt and family, will defer heart cath for now and manage medically. --cont ASA, statin --cont Lopressor and Imdur (new)   # Dyspnea and acute hypoxic respiratory failure --O2 sat noted to drop to 87% on room air noted by ED RN, and pt reported feeling short of breath.  Pt was placed on 3L [place].  Maybe her equivalent symptom of chest pain from her NSTEMI, or from some fluid overload, or both. --s/p IV lasix 40 mg x1 --currently needs 2L O2 while up and walking --will discharge home with 2L O2   HTN --cont Lopressor and Imdur (new)   Mildly reduced systolic function, LVEF 45-50% --per Echo, also with regional wall motion abnormalities   Fever isolated --Pt had a fever to  101.9 early morning 9/2.  No tachycardia, no leukocytosis. --ordered blood cx x2 --no source of infection, abx was not started.  No more fever since.   Discharge Diagnoses:  Active Problems:   NSTEMI (non-ST elevated myocardial infarction) (HCC)   30 Day Unplanned Readmission Risk Score    Flowsheet Row ED to Hosp-Admission (Current) from 10/19/2020 in [facility] PCU  30 Day Unplanned Readmission Risk Score (%) 15.79 Filed at 10/23/2020 0801       This score is the patient's risk of an unplanned readmission within 30 days of being discharged (0 -100%). The score is based on dignosis, age, lab data, medications, orders, and past utilization.   Low:  0-14.9   Medium: 15-21.9   High: 22-29.9   Extreme: 30 and above         Discharge Instructions:  Allergies as of 10/23/2020       Reactions   Hydrocodone Nausea Only   Meloxicam Nausea Only   Upset stomach   Other Other (See Comments)   Tramadol Hcl Nausea Only, Other (See Comments)   sweating   Penicillins Nausea Only, Rash   Sulfa Antibiotics Nausea Only, Hives, Rash   And rash        Medication List     STOP taking these medications    mupirocin ointment 2 % Commonly known as: BACTROBAN   nystatin powder Commonly known as: MYCOSTATIN/NYSTOP   oxyCODONE-acetaminophen  5-325 MG tablet Commonly known as: Percocet       TAKE these medications    Aspirin Low Dose 81 MG EC tablet Generic drug: aspirin Take 1 tablet (81 mg total) by mouth daily. Swallow whole.   atorvastatin 80 MG tablet Commonly known as: LIPITOR Take 1 tablet (80 mg total) by mouth daily.   beta carotene 86578 UNIT capsule Take 25,000 Units by mouth at bedtime.   calcium carbonate 500 MG chewable tablet Commonly known as: TUMS - dosed in mg elemental calcium Chew 1 tablet by mouth 3 (three) times daily as needed for indigestion or heartburn.   cholecalciferol 25 MCG (1000 UNIT) tablet Commonly known as: VITAMIN  D Take 1,000 Units by mouth at bedtime.   ibuprofen 200 MG tablet Commonly known as: ADVIL Take 200 mg by mouth every 8 (eight) hours as needed (back/knee pain).   Iodosorb 0.9 % gel Generic drug: cadexomer iodine Apply 1 application topically daily as needed for wound care.   isosorbide mononitrate 30 MG 24 hr tablet Commonly known as: IMDUR Take 0.5 tablets (15 mg total) by mouth daily.   metoprolol tartrate 25 MG tablet Commonly known as: LOPRESSOR Take 1 tablet (25 mg total) by mouth 2 (two) times daily. What changed: how much to take   multivitamin with minerals Tabs tablet Take 1 tablet by mouth at bedtime. One-A-Day Multivitamin   RED YEAST RICE EXTRACT PO Take 1,200 mg by mouth at bedtime.               Durable Medical Equipment  (From admission, onward)           Start     Ordered   10/22/20 1600  For home use only DME oxygen  Once       Question Answer Comment  Length of Need 6 Months   Mode or (Route) Nasal cannula   Liters per Minute 2   Frequency Continuous (stationary and portable oxygen unit needed)   Oxygen delivery system Gas      10/22/20 1559             Follow-up Information     Paraschos, Alexander, MD Follow up in 1 week(s).   Specialty: Cardiology Contact information: [address] [facility] [place] [address] [phone]         Dorothey Baseman, MD Follow up in 1 week(s).   Specialty: Family Medicine Contact information: [address] [place] [address] [phone]                 Allergies  Allergen Reactions   Hydrocodone Nausea Only   Meloxicam Nausea Only    Upset stomach   Other Other (See Comments)   Tramadol Hcl Nausea Only and Other (See Comments)    sweating   Penicillins Nausea Only and Rash   Sulfa Antibiotics Nausea Only, Hives and Rash    And rash     The results of significant diagnostics from this hospitalization (including imaging,  microbiology, ancillary and laboratory) are listed below for reference.   Consultations:   Procedures/Studies: DG Chest Port 1 View  Result Date: 10/20/2020 CLINICAL DATA:  Chest pain EXAM: PORTABLE CHEST 1 VIEW COMPARISON:  04/27/2010 FINDINGS: Increased interstitial markings. Mild bibasilar atelectasis. No pleural effusion or pneumothorax. The heart is normal in size.  Thoracic aortic atherosclerosis. Degenerative changes of the right shoulder with suspected chronic posttraumatic deformity involving the humeral neck. IMPRESSION: Mild bibasilar opacities, likely atelectasis. No evidence of  acute cardiopulmonary disease. Electronically Signed   By: Charline Bills M.D.   On: 10/20/2020 00:08   ECHOCARDIOGRAM COMPLETE  Result Date: 10/20/2020    ECHOCARDIOGRAM REPORT   Patient Name:   Lakyn POGATS Pelly Date of Exam: 10/20/2020 Medical Rec #:  [id number]       Height:       59.0 in Accession #:    [id number]      Weight:       141.7 lb Date of Birth:  Mar 20, 1915       BSA:          1.593 m Patient Age:    87 years       BP:           136/83 mmHg Patient Gender: F               HR:           65 bpm. Exam Location:  [facility] Procedure: 2D Echo, Cardiac Doppler and Color Doppler Indications:     NSTEMI I21.4  History:         Patient has prior history of Echocardiogram examinations, most                  recent 06/29/2020. PAF.  Sonographer:     Cristela Blue Referring Phys:  [id number] JAN A MANSY Diagnosing Phys: Marcina Millard MD  Sonographer Comments: Suboptimal apical window. IMPRESSIONS  1. Left ventricular ejection fraction, by estimation, is 45 to 50%. The left ventricle has mildly decreased function. The left ventricle demonstrates regional wall motion abnormalities (see scoring diagram/findings for description). Left ventricular diastolic parameters were normal.  2. Right ventricular systolic function is normal. The right ventricular size is normal.  3. The mitral valve is normal in structure. No evidence of  mitral valve regurgitation. No evidence of mitral stenosis.  4. The aortic valve is normal in structure. Aortic valve regurgitation is not visualized. No aortic stenosis is present.  5. The inferior vena cava is normal in size with greater than 50% respiratory variability, suggesting right atrial pressure of 3 mmHg. FINDINGS  Left Ventricle: Left ventricular ejection fraction, by estimation, is 45 to 50%. The left ventricle has mildly decreased function. The left ventricle demonstrates regional wall motion abnormalities. The left ventricular internal cavity size was normal in size. There is no left ventricular hypertrophy. Left ventricular diastolic parameters were normal.  LV Wall Scoring: The apical lateral segment, apical septal segment, and apex are hypokinetic. Right Ventricle: The right ventricular size is normal. No increase in right ventricular wall thickness. Right ventricular systolic function is normal. Left Atrium: Left atrial size was normal in size. Right Atrium: Right atrial size was normal in size. Pericardium: There is no evidence of pericardial effusion. Mitral Valve: The mitral valve is normal in structure. No evidence of mitral valve regurgitation. No evidence of mitral valve stenosis. Tricuspid Valve: The tricuspid valve is normal in structure. Tricuspid valve regurgitation is not demonstrated. No evidence of tricuspid stenosis. Aortic Valve: The aortic valve is normal in structure. Aortic valve regurgitation is not visualized. Aortic regurgitation PHT measures 355 msec. No aortic stenosis is present. Aortic valve mean gradient measures 2.5 mmHg. Aortic valve peak gradient measures 3.9 mmHg. Aortic valve area, by VTI measures 2.73 cm. Pulmonic Valve: The pulmonic valve was normal in structure. Pulmonic valve regurgitation is not visualized. No evidence of pulmonic stenosis. Aorta: The aortic root is normal in size and structure. Venous: The inferior vena cava is  normal in size with greater than  50% respiratory variability, suggesting right atrial pressure of 3 mmHg. IAS/Shunts: No atrial level shunt detected by color flow Doppler.  LEFT VENTRICLE PLAX 2D LVIDd:         3.80 cm  Diastology LVIDs:         2.70 cm  LV e' medial:    4.03 cm/s LV PW:         1.30 cm  LV E/e' medial:  21.0 LV IVS:        0.90 cm  LV e' lateral:   4.03 cm/s LVOT diam:     2.00 cm  LV E/e' lateral: 21.0 LV SV:         48 LV SV Index:   30 LVOT Area:     3.14 cm  RIGHT VENTRICLE RV Basal diam:  3.00 cm LEFT ATRIUM           Index       RIGHT ATRIUM           Index LA diam:      3.60 cm 2.26 cm/m  RA Area:     13.40 cm LA Vol (A2C): 52.3 ml 32.83 ml/m RA Volume:   36.80 ml  23.10 ml/m LA Vol (A4C): 15.8 ml 9.92 ml/m  AORTIC VALVE                   PULMONIC VALVE AV Area (Vmax):    1.96 cm    RVOT Peak grad: 1 mmHg AV Area (Vmean):   2.09 cm AV Area (VTI):     2.73 cm AV Vmax:           99.15 cm/s AV Vmean:          65.150 cm/s AV VTI:            0.176 m AV Peak Grad:      3.9 mmHg AV Mean Grad:      2.5 mmHg LVOT Vmax:         62.00 cm/s LVOT Vmean:        43.400 cm/s LVOT VTI:          0.153 m LVOT/AV VTI ratio: 0.87 AI PHT:            355 msec  AORTA Ao Root diam: 3.73 cm MITRAL VALVE               TRICUSPID VALVE MV Area (PHT): 5.62 cm    TR Peak grad:   52.1 mmHg MV Decel Time: 135 msec    TR Vmax:        361.00 cm/s MV E velocity: 84.80 cm/s MV A velocity: 92.10 cm/s  SHUNTS MV E/A ratio:  0.92        Systemic VTI:  0.15 m                            Systemic Diam: 2.00 cm Marcina Millard MD Electronically signed by Marcina Millard MD Signature Date/Time: 10/20/2020/1:04:22 PM    Final       Labs: BNP (last 3 results) No results for input(s): BNP in the last 8760 hours. Basic Metabolic Panel: Recent Labs  Lab 10/19/20 2349 10/20/20 0619 10/21/20 0608 10/22/20 0530 10/23/20 0522  NA 136 136 134* 137 135  K 4.0 4.2 3.7 4.2 4.3  CL 101 102 97* 100 100  CO2 29 27 29  32 30  GLUCOSE  117* 124* 115* 89  88  BUN 24* 27* 27* 32* 30*  CREATININE 0.79 0.65 0.92 0.84 0.79  CALCIUM 9.1 8.7* 8.2* 8.6* 8.4*  MG  --   --  1.9 2.1 2.1   Liver Function Tests: Recent Labs  Lab 10/19/20 2349  AST 30  ALT 10  ALKPHOS 97  BILITOT 0.6  PROT 7.4  ALBUMIN 3.8   Recent Labs  Lab 10/19/20 2349  LIPASE 46   No results for input(s): AMMONIA in the last 168 hours. CBC: Recent Labs  Lab 10/19/20 2349 10/20/20 0619 10/21/20 0608 10/22/20 0530 10/23/20 0522  WBC 4.1 4.8 3.5* 3.3* 3.0*  HGB 13.2 13.1 12.5 11.6* 11.1*  HCT 40.5 41.0 39.1 36.1 33.9*  MCV 96.7 95.6 94.0 95.3 97.4  PLT 146* 148* 128* 123* 121*   Cardiac Enzymes: No results for input(s): CKTOTAL, CKMB, CKMBINDEX, TROPONINI in the last 168 hours. BNP: Invalid input(s): POCBNP CBG: No results for input(s): GLUCAP in the last 168 hours. D-Dimer No results for input(s): DDIMER in the last 72 hours. Hgb A1c No results for input(s): HGBA1C in the last 72 hours. Lipid Profile No results for input(s): CHOL, HDL, LDLCALC, TRIG, CHOLHDL, LDLDIRECT in the last 72 hours. Thyroid function studies No results for input(s): TSH, T4TOTAL, T3FREE, THYROIDAB in the last 72 hours.  Invalid input(s): FREET3 Anemia work up No results for input(s): VITAMINB12, FOLATE, FERRITIN, TIBC, IRON, RETICCTPCT in the last 72 hours. Urinalysis    Component Value Date/Time   COLORURINE YELLOW (A) 07/13/2020 0930   APPEARANCEUR CLEAR (A) 07/13/2020 0930   LABSPEC 1.011 07/13/2020 0930   PHURINE 8.0 07/13/2020 0930   GLUCOSEU NEGATIVE 07/13/2020 0930   HGBUR NEGATIVE 07/13/2020 0930   BILIRUBINUR NEGATIVE 07/13/2020 0930   KETONESUR NEGATIVE 07/13/2020 0930   PROTEINUR NEGATIVE 07/13/2020 0930   NITRITE NEGATIVE 07/13/2020 0930   LEUKOCYTESUR NEGATIVE 07/13/2020 0930   Sepsis Labs Invalid input(s): PROCALCITONIN,  WBC,  LACTICIDVEN Microbiology Recent Results (from the past 240 hour(s))  Resp Panel by RT-PCR (Flu A&B, Covid) Nasopharyngeal  Swab     Status: None   Collection Time: 10/19/20 11:49 PM   Specimen: Nasopharyngeal Swab; Nasopharyngeal(NP) swabs in vial transport medium  Result Value Ref Range Status   SARS Coronavirus 2 by RT PCR NEGATIVE NEGATIVE Final    Comment: (NOTE) SARS-CoV-2 target nucleic acids are NOT DETECTED.  The SARS-CoV-2 RNA is generally detectable in upper respiratory specimens during the acute phase of infection. The lowest concentration of SARS-CoV-2 viral copies this assay can detect is 138 copies/mL. A negative result does not preclude SARS-Cov-2 infection and should not be used as the sole basis for treatment or other patient management decisions. A negative result may occur with  improper specimen collection/handling, submission of specimen other than nasopharyngeal swab, presence of viral mutation(s) within the areas targeted by this assay, and inadequate number of viral copies(<138 copies/mL). A negative result must be combined with clinical observations, patient history, and epidemiological information. The expected result is Negative.  Fact Sheet for Patients:  BloggerCourse.com  Fact Sheet for Healthcare Providers:  SeriousBroker.it  This test is no t yet approved or cleared by the [place] FDA and  has been authorized for detection and/or diagnosis of SARS-CoV-2 by FDA under an Emergency Use Authorization (EUA). This EUA will remain  in effect (meaning this test can be used) for the duration of the COVID-19 declaration under Section 564(b)(1) of the Act, 21 U.S.C.section 360bbb-3(b)(1), unless the authorization is terminated  or revoked sooner.       Influenza A by PCR NEGATIVE NEGATIVE Final   Influenza B by PCR NEGATIVE NEGATIVE Final    Comment: (NOTE) The Xpert Xpress SARS-CoV-2/FLU/RSV plus assay is intended as an aid in the diagnosis of influenza from Nasopharyngeal swab specimens and should not be used as a sole  basis for treatment. Nasal washings and aspirates are unacceptable for Xpert Xpress SARS-CoV-2/FLU/RSV testing.  Fact Sheet for Patients: BloggerCourse.comhttps://www.fda.gov/media/152166/download  Fact Sheet for Healthcare Providers: SeriousBroker.ithttps://www.fda.gov/media/152162/download  This test is not yet approved or cleared by the [place] FDA and has been authorized for detection and/or diagnosis of SARS-CoV-2 by FDA under an Emergency Use Authorization (EUA). This EUA will remain in effect (meaning this test can be used) for the duration of the COVID-19 declaration under Section 564(b)(1) of the Act, 21 U.S.C. section 360bbb-3(b)(1), unless the authorization is terminated or revoked.  Performed at [facility], [address] [place], [place] [address]   Culture, blood (Routine X 2) w Reflex to ID Panel     Status: None (Preliminary result)   Collection Time: 10/21/20  9:44 AM   Specimen: BLOOD  Result Value Ref Range Status   Specimen Description BLOOD LH  Final   Special Requests   Final    BOTTLES DRAWN AEROBIC AND ANAEROBIC Blood Culture adequate volume   Culture   Final    NO GROWTH < 24 HOURS Performed at [facility], [address] [place], [place] [address]    Report Status PENDING  Incomplete  Culture, blood (Routine X 2) w Reflex to ID Panel     Status: None (Preliminary result)   Collection Time: 10/21/20  9:44 AM   Specimen: BLOOD  Result Value Ref Range Status   Specimen Description BLOOD LA  Final   Special Requests   Final    BOTTLES DRAWN AEROBIC AND ANAEROBIC Blood Culture adequate volume   Culture   Final    NO GROWTH < 24 HOURS Performed at [facility], [address] [place], [place] [address]    Report Status PENDING  Incomplete     Total time spend on discharging this patient, including the last patient exam, discussing the hospital stay, instructions for ongoing care as it relates to all pertinent caregivers, as well as  preparing the medical discharge records, prescriptions, and/or referrals as applicable, is 30 minutes.    Darlin Priestlyina Edis Huish, MD  [facility] 10/23/2020, 9:22 AM

## 2020-10-23 NOTE — Plan of Care (Signed)
  Problem: Education: Goal: Knowledge of General Education information will improve Description: Including pain rating scale, medication(s)/side effects and non-pharmacologic comfort measures Outcome: Adequate for Discharge   Problem: Health Behavior/Discharge Planning: Goal: Ability to manage health-related needs will improve Outcome: Adequate for Discharge   Problem: Clinical Measurements: Goal: Ability to maintain clinical measurements within normal limits will improve Outcome: Adequate for Discharge Goal: Will remain free from infection Outcome: Adequate for Discharge Goal: Diagnostic test results will improve Outcome: Adequate for Discharge Goal: Respiratory complications will improve Outcome: Adequate for Discharge Goal: Cardiovascular complication will be avoided Outcome: Adequate for Discharge   Problem: Activity: Goal: Risk for activity intolerance will decrease Outcome: Adequate for Discharge   Problem: Nutrition: Goal: Adequate nutrition will be maintained Outcome: Adequate for Discharge   Problem: Coping: Goal: Level of anxiety will decrease Outcome: Adequate for Discharge   Problem: Elimination: Goal: Will not experience complications related to bowel motility Outcome: Adequate for Discharge Goal: Will not experience complications related to urinary retention Outcome: Adequate for Discharge   Problem: Pain Managment: Goal: General experience of comfort will improve Outcome: Adequate for Discharge   Problem: Safety: Goal: Ability to remain free from injury will improve Outcome: Adequate for Discharge   Problem: Acute Rehab PT Goals(only PT should resolve) Goal: Pt Will Transfer Bed To Chair/Chair To Bed Outcome: Adequate for Discharge Goal: Pt Will Ambulate Outcome: Adequate for Discharge   Problem: Skin Integrity: Goal: Risk for impaired skin integrity will decrease Outcome: Adequate for Discharge   Problem: Acute Rehab PT Goals(only PT should  resolve) Goal: Pt Will Transfer Bed To Chair/Chair To Bed Outcome: Adequate for Discharge Goal: Pt Will Ambulate Outcome: Adequate for Discharge

## 2020-10-23 NOTE — Progress Notes (Signed)
Report given to [place] med tech at [employer].  Rebekah Bridges, Kae Heller, RN

## 2020-10-23 NOTE — Progress Notes (Signed)
Attempted report for third time  was given  [place] nurses personal number by case mang still no answer. Left message   Elex Mainwaring, Kae Heller, RN

## 2020-10-26 LAB — CULTURE, BLOOD (ROUTINE X 2)
Culture: NO GROWTH
Culture: NO GROWTH
Special Requests: ADEQUATE
Special Requests: ADEQUATE

## 2020-11-29 ENCOUNTER — Ambulatory Visit (HOSPITAL_BASED_OUTPATIENT_CLINIC_OR_DEPARTMENT_OTHER): Payer: Medicare Other | Admitting: General Practice

## 2021-02-02 ENCOUNTER — Emergency Department: Payer: Medicare Other

## 2021-02-02 ENCOUNTER — Inpatient Hospital Stay
Admission: EM | Admit: 2021-02-02 | Discharge: 2021-02-08 | DRG: 291 | Disposition: A | Discharge status: Home / Self Care | Payer: Medicare Other | Source: Skilled Nursing Facility | Attending: Internal Medicine | Admitting: Internal Medicine

## 2021-02-02 ENCOUNTER — Other Ambulatory Visit: Payer: Self-pay

## 2021-02-02 DIAGNOSIS — Z79899 Other long term (current) drug therapy: Secondary | ICD-10-CM

## 2021-02-02 DIAGNOSIS — I252 Old myocardial infarction: Secondary | ICD-10-CM | POA: Diagnosis not present

## 2021-02-02 DIAGNOSIS — J9621 Acute and chronic respiratory failure with hypoxia: Secondary | ICD-10-CM | POA: Diagnosis present

## 2021-02-02 DIAGNOSIS — Z882 Allergy status to sulfonamides status: Secondary | ICD-10-CM

## 2021-02-02 DIAGNOSIS — N3000 Acute cystitis without hematuria: Secondary | ICD-10-CM | POA: Diagnosis present

## 2021-02-02 DIAGNOSIS — M48061 Spinal stenosis, lumbar region without neurogenic claudication: Secondary | ICD-10-CM | POA: Diagnosis present

## 2021-02-02 DIAGNOSIS — M810 Age-related osteoporosis without current pathological fracture: Secondary | ICD-10-CM | POA: Diagnosis present

## 2021-02-02 DIAGNOSIS — I4811 Longstanding persistent atrial fibrillation: Secondary | ICD-10-CM | POA: Diagnosis present

## 2021-02-02 DIAGNOSIS — Z885 Allergy status to narcotic agent status: Secondary | ICD-10-CM

## 2021-02-02 DIAGNOSIS — I509 Heart failure, unspecified: Secondary | ICD-10-CM

## 2021-02-02 DIAGNOSIS — E861 Hypovolemia: Secondary | ICD-10-CM | POA: Diagnosis present

## 2021-02-02 DIAGNOSIS — H9190 Unspecified hearing loss, unspecified ear: Secondary | ICD-10-CM | POA: Diagnosis present

## 2021-02-02 DIAGNOSIS — N179 Acute kidney failure, unspecified: Secondary | ICD-10-CM | POA: Diagnosis present

## 2021-02-02 DIAGNOSIS — I10 Essential (primary) hypertension: Secondary | ICD-10-CM | POA: Diagnosis present

## 2021-02-02 DIAGNOSIS — I248 Other forms of acute ischemic heart disease: Secondary | ICD-10-CM | POA: Diagnosis present

## 2021-02-02 DIAGNOSIS — B9620 Unspecified Escherichia coli [E. coli] as the cause of diseases classified elsewhere: Secondary | ICD-10-CM | POA: Diagnosis present

## 2021-02-02 DIAGNOSIS — F0390 Unspecified dementia without behavioral disturbance: Secondary | ICD-10-CM | POA: Diagnosis present

## 2021-02-02 DIAGNOSIS — I739 Peripheral vascular disease, unspecified: Secondary | ICD-10-CM | POA: Diagnosis present

## 2021-02-02 DIAGNOSIS — M1990 Unspecified osteoarthritis, unspecified site: Secondary | ICD-10-CM | POA: Diagnosis present

## 2021-02-02 DIAGNOSIS — I130 Hypertensive heart and chronic kidney disease with heart failure and stage 1 through stage 4 chronic kidney disease, or unspecified chronic kidney disease: Principal | ICD-10-CM | POA: Diagnosis present

## 2021-02-02 DIAGNOSIS — T502X5A Adverse effect of carbonic-anhydrase inhibitors, benzothiadiazides and other diuretics, initial encounter: Secondary | ICD-10-CM | POA: Diagnosis present

## 2021-02-02 DIAGNOSIS — Z888 Allergy status to other drugs, medicaments and biological substances status: Secondary | ICD-10-CM

## 2021-02-02 DIAGNOSIS — Z20822 Contact with and (suspected) exposure to covid-19: Secondary | ICD-10-CM | POA: Diagnosis present

## 2021-02-02 DIAGNOSIS — I5021 Acute systolic (congestive) heart failure: Secondary | ICD-10-CM | POA: Diagnosis not present

## 2021-02-02 DIAGNOSIS — R778 Other specified abnormalities of plasma proteins: Secondary | ICD-10-CM

## 2021-02-02 DIAGNOSIS — N183 Chronic kidney disease, stage 3 unspecified: Secondary | ICD-10-CM | POA: Diagnosis present

## 2021-02-02 DIAGNOSIS — Z7982 Long term (current) use of aspirin: Secondary | ICD-10-CM

## 2021-02-02 DIAGNOSIS — G629 Polyneuropathy, unspecified: Secondary | ICD-10-CM | POA: Diagnosis present

## 2021-02-02 DIAGNOSIS — I4891 Unspecified atrial fibrillation: Secondary | ICD-10-CM | POA: Diagnosis not present

## 2021-02-02 DIAGNOSIS — E871 Hypo-osmolality and hyponatremia: Secondary | ICD-10-CM | POA: Diagnosis present

## 2021-02-02 DIAGNOSIS — Z9981 Dependence on supplemental oxygen: Secondary | ICD-10-CM

## 2021-02-02 DIAGNOSIS — Z66 Do not resuscitate: Secondary | ICD-10-CM | POA: Diagnosis present

## 2021-02-02 DIAGNOSIS — I5043 Acute on chronic combined systolic (congestive) and diastolic (congestive) heart failure: Secondary | ICD-10-CM | POA: Diagnosis present

## 2021-02-02 DIAGNOSIS — E878 Other disorders of electrolyte and fluid balance, not elsewhere classified: Secondary | ICD-10-CM | POA: Diagnosis present

## 2021-02-02 DIAGNOSIS — I5023 Acute on chronic systolic (congestive) heart failure: Secondary | ICD-10-CM

## 2021-02-02 DIAGNOSIS — Z880 Allergy status to penicillin: Secondary | ICD-10-CM

## 2021-02-02 LAB — URINALYSIS, COMPLETE (UACMP) WITH MICROSCOPIC
Bilirubin Urine: NEGATIVE
Glucose, UA: NEGATIVE mg/dL
Ketones, ur: NEGATIVE mg/dL
Nitrite: POSITIVE — AB
Protein, ur: 100 mg/dL — AB
Specific Gravity, Urine: 1.02 (ref 1.005–1.030)
WBC, UA: >50 WBC/hpf (ref 0–5)
pH: 5.5 (ref 5.0–8.0)

## 2021-02-02 LAB — TSH: TSH: 4.013 u[IU]/mL (ref 0.350–4.500)

## 2021-02-02 LAB — TROPONIN I (HIGH SENSITIVITY)
Troponin I (High Sensitivity): 367 ng/L (ref <18)
Troponin I (High Sensitivity): 334 ng/L (ref <18)

## 2021-02-02 LAB — CBC WITH DIFFERENTIAL/PLATELET
Abs Immature Granulocytes: 0.07 10*3/uL (ref 0.00–0.07)
Basophils Absolute: 0 10*3/uL (ref 0.0–0.1)
Basophils Relative: 0 %
Eosinophils Absolute: 0 10*3/uL (ref 0.0–0.5)
Eosinophils Relative: 0 %
HCT: 35 % — ABNORMAL LOW (ref 36.0–46.0)
Hemoglobin: 11.3 g/dL — ABNORMAL LOW (ref 12.0–15.0)
Immature Granulocytes: 1 %
Lymphocytes Relative: 5 %
Lymphs Abs: 0.5 10*3/uL — ABNORMAL LOW (ref 0.7–4.0)
MCH: 30.2 pg (ref 26.0–34.0)
MCHC: 32.3 g/dL (ref 30.0–36.0)
MCV: 93.6 fL (ref 80.0–100.0)
Monocytes Absolute: 0.6 10*3/uL (ref 0.1–1.0)
Monocytes Relative: 6 %
Neutro Abs: 8.2 10*3/uL — ABNORMAL HIGH (ref 1.7–7.7)
Neutrophils Relative %: 88 %
Platelets: 126 10*3/uL — ABNORMAL LOW (ref 150–400)
RBC: 3.74 MIL/uL — ABNORMAL LOW (ref 3.87–5.11)
RDW: 16.3 % — ABNORMAL HIGH (ref 11.5–15.5)
WBC: 9.3 10*3/uL (ref 4.0–10.5)
nRBC: 0 % (ref 0.0–0.2)

## 2021-02-02 LAB — COMPREHENSIVE METABOLIC PANEL
ALT: 15 U/L (ref 0–44)
AST: 50 U/L — ABNORMAL HIGH (ref 15–41)
Albumin: 3.6 g/dL (ref 3.5–5.0)
Alkaline Phosphatase: 64 U/L (ref 38–126)
Anion gap: 8 (ref 5–15)
BUN: 32 mg/dL — ABNORMAL HIGH (ref 8–23)
CO2: 27 mmol/L (ref 22–32)
Calcium: 8.5 mg/dL — ABNORMAL LOW (ref 8.9–10.3)
Chloride: 92 mmol/L — ABNORMAL LOW (ref 98–111)
Creatinine, Ser: 0.86 mg/dL (ref 0.44–1.00)
GFR, Estimated: 59 mL/min — ABNORMAL LOW (ref >60)
Glucose, Bld: 116 mg/dL — ABNORMAL HIGH (ref 70–99)
Potassium: 3.9 mmol/L (ref 3.5–5.1)
Sodium: 127 mmol/L — ABNORMAL LOW (ref 135–145)
Total Bilirubin: 1.1 mg/dL (ref 0.3–1.2)
Total Protein: 7.5 g/dL (ref 6.5–8.1)

## 2021-02-02 LAB — AMMONIA: Ammonia: <10 umol/L (ref 9–35)

## 2021-02-02 LAB — RESP PANEL BY RT-PCR (FLU A&B, COVID) ARPGX2
Influenza A by PCR: NEGATIVE
Influenza B by PCR: NEGATIVE
SARS Coronavirus 2 by RT PCR: NEGATIVE

## 2021-02-02 LAB — PROCALCITONIN: Procalcitonin: 2.61 ng/mL

## 2021-02-02 LAB — BRAIN NATRIURETIC PEPTIDE: B Natriuretic Peptide: 1379.5 pg/mL — ABNORMAL HIGH (ref 0.0–100.0)

## 2021-02-02 MED ORDER — ONDANSETRON HCL 4 MG/2ML IJ SOLN
4.0000 mg | Freq: Four times a day (QID) | INTRAMUSCULAR | Status: DC | PRN
  Administered 2021-02-02: 4 mg via INTRAVENOUS
  Filled 2021-02-02: qty 2

## 2021-02-02 MED ORDER — ACETAMINOPHEN 325 MG PO TABS
650.0000 mg | ORAL_TABLET | Freq: Four times a day (QID) | ORAL | Status: DC | PRN
  Administered 2021-02-05: 01:00:00 650 mg via ORAL
  Filled 2021-02-02 (×2): qty 2

## 2021-02-02 MED ORDER — ENOXAPARIN SODIUM 30 MG/0.3ML IJ SOSY
30.0000 mg | PREFILLED_SYRINGE | INTRAMUSCULAR | Status: DC
  Administered 2021-02-02 – 2021-02-03 (×2): 30 mg via SUBCUTANEOUS
  Filled 2021-02-02 (×2): qty 0.3

## 2021-02-02 MED ORDER — ISOSORBIDE MONONITRATE ER 30 MG PO TB24
15.0000 mg | ORAL_TABLET | Freq: Every day | ORAL | Status: DC
  Administered 2021-02-03 – 2021-02-08 (×6): 15 mg via ORAL
  Filled 2021-02-02 (×6): qty 1

## 2021-02-02 MED ORDER — ACETAMINOPHEN 325 MG PO TABS
650.0000 mg | ORAL_TABLET | ORAL | Status: DC | PRN
  Administered 2021-02-02: 650 mg via ORAL

## 2021-02-02 MED ORDER — FUROSEMIDE 10 MG/ML IJ SOLN
40.0000 mg | Freq: Once | INTRAMUSCULAR | Status: AC
  Administered 2021-02-02: 40 mg via INTRAVENOUS
  Filled 2021-02-02: qty 4

## 2021-02-02 MED ORDER — SODIUM CHLORIDE 0.9 % IV SOLN
1.0000 g | INTRAVENOUS | Status: DC
  Administered 2021-02-03 – 2021-02-04 (×2): 1 g via INTRAVENOUS
  Filled 2021-02-02: qty 10
  Filled 2021-02-02: qty 1
  Filled 2021-02-02: qty 10

## 2021-02-02 MED ORDER — ATORVASTATIN CALCIUM 80 MG PO TABS
80.0000 mg | ORAL_TABLET | Freq: Every day | ORAL | Status: DC
  Administered 2021-02-02 – 2021-02-07 (×6): 80 mg via ORAL
  Filled 2021-02-02: qty 1
  Filled 2021-02-02: qty 4
  Filled 2021-02-02 (×4): qty 1

## 2021-02-02 MED ORDER — METOPROLOL TARTRATE 25 MG PO TABS
25.0000 mg | ORAL_TABLET | Freq: Two times a day (BID) | ORAL | Status: DC
  Administered 2021-02-02 – 2021-02-03 (×3): 25 mg via ORAL
  Filled 2021-02-02 (×3): qty 1

## 2021-02-02 MED ORDER — LISINOPRIL 5 MG PO TABS
2.5000 mg | ORAL_TABLET | Freq: Every day | ORAL | Status: DC
  Filled 2021-02-02: qty 1

## 2021-02-02 MED ORDER — SODIUM CHLORIDE 0.9 % IV SOLN
1.0000 g | Freq: Once | INTRAVENOUS | Status: AC
  Administered 2021-02-02: 1 g via INTRAVENOUS
  Filled 2021-02-02: qty 10

## 2021-02-02 MED ORDER — ASPIRIN 81 MG PO CHEW
324.0000 mg | CHEWABLE_TABLET | Freq: Once | ORAL | Status: AC
  Administered 2021-02-02: 324 mg via ORAL
  Filled 2021-02-02: qty 4

## 2021-02-02 MED ORDER — SODIUM CHLORIDE 0.9% FLUSH: 3.0000 mL | INTRAVENOUS | Status: DC | PRN

## 2021-02-02 MED ORDER — ASPIRIN EC 81 MG PO TBEC
81.0000 mg | DELAYED_RELEASE_TABLET | ORAL | Status: DC
  Administered 2021-02-03: 81 mg via ORAL
  Filled 2021-02-02: qty 1

## 2021-02-02 MED ORDER — SODIUM CHLORIDE 0.9 % IV SOLN: 250.0000 mL | INTRAVENOUS | Status: DC | PRN

## 2021-02-02 MED ORDER — SODIUM CHLORIDE 0.9% FLUSH
3.0000 mL | Freq: Two times a day (BID) | INTRAVENOUS | Status: DC
  Administered 2021-02-03 – 2021-02-08 (×9): 3 mL via INTRAVENOUS

## 2021-02-02 MED ORDER — FUROSEMIDE 10 MG/ML IJ SOLN: 40.0000 mg | Freq: Every day | INTRAMUSCULAR | Status: DC

## 2021-02-02 NOTE — ED Triage Notes (Signed)
Pt BIB EMS from [facility] for concerns abt her O2 sats. Pt is supposed to wear 2L/min [place] but on RA on EMS arrival. RA sat was 87%; when EMS put pt on her baseline 2L/min, O2 sat came up to 91% & was 98% 4L/min. Pt currently on her baseline 2L/min & O2 sats 96-100%

## 2021-02-02 NOTE — ED Notes (Signed)
Daughter-in-law, Roselyn Bering, is going home, but will call back to check on pt later. [name] would like RN to call her if the pt does get a room assigned or if anything changes before she calls to check on pt.  [phone]

## 2021-02-02 NOTE — H&P (Signed)
History and Physical    Rebekah Bridges JAR:[id number] DOB: 03-Mar-1915 DOA: 02/02/2021  PCP: System, Provider Not In   Patient coming from: Assisted living facility  I have personally briefly reviewed patient's old medical records in [facility]  Chief Complaint: Multiple complaints(generalized weakness, burning urination, shortness of breath)  HPI: Rebekah Bridges is a 87 y.o. female with PMH significant for hypertension, CHF, osteoporosis, paroxysmal A. fib, chronic hypoxic respiratory failure on 2 L of supplemental oxygen as needed and at night presented from assisted living facility with multiple complaints.  Patient denies any symptoms except burning urination.  History is obtained from daughter who reports patient has been feeling generalized weakness for last few days and she has fallen 2 days ago without any physical injury.  Patient also been feeling more short of breath.  She has been using her oxygen all the time for last 2 days.  She denies any fever, cough, sore throat, runny nose, recent travel, sick contact, nausea, vomiting, diarrhea.  Patient also denies any chest pain, palpitation, dizziness, headache.  ED Course: She was hemodynamically stable except tachycardia. HR 110, RR 19, temp 98.1, BP 114/73, SPO2 100% on 2 L. Labs include: Sodium 127, potassium 3.9, chloride 92, bicarb 27, glucose 116, BUN 32, creatinine 0.86, calcium 8.5, alkaline phosphatase 64, albumin 3.6, AST 50, ALT 15, total protein 7.5, ammonia less than 10, total bilirubin 1.1, procalcitonin 2.61, BNP 1379, troponin 367, WBC 9.3, hemoglobin 11.3, hematocrit 35.0, platelet 126, TSH 4.01, influenza negative, COVID-negative, UA: Leukocyte+, nitrites+, bacteria+. CT head no acute intracranial abnormality. Chest x-ray: Mild left basilar opacities, likely atelectasis.   Review of Systems:Review of Systems  Constitutional:  Positive for malaise/fatigue.  HENT:  Positive for hearing loss.   Eyes: Negative.    Respiratory:  Positive for shortness of breath.   Cardiovascular: Negative.   Gastrointestinal: Negative.   Genitourinary:  Positive for dysuria.  Musculoskeletal: Negative.   Skin: Negative.   Neurological:  Positive for weakness.  Endo/Heme/Allergies: Negative.     Past Medical History:  Diagnosis Date   Osteoporosis    s/p vertebroplasty for fracutres x 3   PAF (paroxysmal atrial fibrillation) (HCC) 07/17/2019    Past Surgical History:  Procedure Laterality Date   ABDOMINAL AORTOGRAM W/LOWER EXTREMITY N/A 05/26/2020   Procedure: ABDOMINAL AORTOGRAM W/LOWER EXTREMITY;  Surgeon: Cephus Shelling, MD;  Location: [facility];  Service: Cardiovascular;  Laterality: N/A;   BLADDER AUGMENTATION  1964   FEMORAL-POPLITEAL BYPASS GRAFT Right 06/27/2020   Procedure: RIGHT COOMMON FEMORAL TO ABOVE KNEE POPLITEAL ARTERY BYPASS GRAFT USING RIGHT GREATER SAPHENOUS VEIN;  Surgeon: Cephus Shelling, MD;  Location: [facility] OR;  Service: Vascular;  Laterality: Right;   SPINE SURGERY  2010   SPINE SURGERY  2011   SPINE SURGERY  2012   WOUND DEBRIDEMENT Right 06/27/2020   Procedure: RIGHT FOOT PARTIAL RESECTION FIRST METATARSAL/ PHALANGEAL JOINT, IMPLANT NON-BIOLOGICAL DRUG IMPLANT;  Surgeon: Edwin Cap, DPM;  Location: [facility] OR;  Service: Podiatry;  Laterality: Right;     reports that she has never smoked. She has never used smokeless tobacco. She reports that she does not currently use alcohol. She reports that she does not use drugs.  Allergies  Allergen Reactions   Hydrocodone Nausea Only   Meloxicam Nausea Only    Upset stomach   Other Other (See Comments)   Tramadol Hcl Nausea Only and Other (See Comments)    sweating   Penicillins Nausea Only and Rash  Sulfa Antibiotics Nausea Only, Hives and Rash    And rash    Family History  Problem Relation Age of Onset   Cancer Father    Learning disabilities Paternal Grandmother    Cancer Sister    Cancer Brother     Family  history reviewed and not pertinent.  Prior to Admission medications   Medication Sig Start Date End Date Taking? Authorizing Provider  aspirin 81 MG EC tablet Take 1 tablet (81 mg total) by mouth daily. Swallow whole. Patient taking differently: Take 81 mg by mouth every other day. Swallow whole. 06/30/20  Yes Setzer, Ed[address] Circle, PA-C  beta carotene 25000 UNIT capsule Take 25,000 Units by mouth at bedtime.   Yes [provider]  cholecalciferol (VITAMIN D) 25 MCG (1000 UNIT) tablet Take 1,000 Units by mouth at bedtime.   Yes [provider]  furosemide (LASIX) 20 MG tablet Take 20 mg by mouth daily. 01/23/21  Yes [provider]  isosorbide mononitrate (IMDUR) 30 MG 24 hr tablet Take 0.5 tablets (15 mg total) by mouth daily. 10/22/20 02/02/21 Yes Enzo Bi, MD  metoprolol tartrate (LOPRESSOR) 25 MG tablet Take 1 tablet (25 mg total) by mouth 2 (two) times daily. 06/30/20  Yes Setzer, Edman Circle, PA-C  Multiple Vitamin (MULTIVITAMIN WITH MINERALS) TABS tablet Take 1 tablet by mouth at bedtime. One-A-Day Multivitamin   Yes [provider]  acetaminophen (TYLENOL) 325 MG tablet Take 650 mg by mouth every 6 (six) hours as needed. 01/05/21   [provider]  atorvastatin (LIPITOR) 80 MG tablet Take 1 tablet (80 mg total) by mouth daily. 10/22/20 01/20/21  Enzo Bi, MD  cadexomer iodine (IODOSORB) 0.9 % gel Apply 1 application topically daily as needed for wound care. Patient not taking: Reported on 02/02/2021 06/01/20   Criselda Peaches, DPM  calcium carbonate (TUMS - DOSED IN MG ELEMENTAL CALCIUM) 500 MG chewable tablet Chew 1 tablet by mouth 3 (three) times daily as needed for indigestion or heartburn.    [provider]  ibuprofen (ADVIL) 200 MG tablet Take 200 mg by mouth every 8 (eight) hours as needed (back/knee pain).    [provider]  RED YEAST RICE EXTRACT PO Take 1,200 mg by mouth at bedtime. Patient not taking: Reported on 02/02/2021     [provider]    Physical Exam: Vitals:   02/02/21 1436 02/02/21 1500 02/02/21 1550 02/02/21 1555  BP: 114/73 110/90    Pulse:  96 (!) 105 99  Resp:  (!) 22 20 17   Temp:      TempSrc:      SpO2:  99% 100% 100%  Weight:      Height:        Constitutional: Appears comfortable, deconditioned, not in any acute distress. Vitals:   02/02/21 1436 02/02/21 1500 02/02/21 1550 02/02/21 1555  BP: 114/73 110/90    Pulse:  96 (!) 105 99  Resp:  (!) 22 20 17   Temp:      TempSrc:      SpO2:  99% 100% 100%  Weight:      Height:       Eyes: PERRL, lids and conjunctivae normal ENMT: Mucous membranes are moist.  Posterior pharynx without exudate..Normal dentition.  Neck: normal, supple, no masses, no thyromegaly Respiratory: Clear to auscultation bilaterally, no wheezing, no crackles. Cardiovascular: S1-S2 heard, regular rate and rhythm, no murmur. Abdomen: Abdomen is soft, nontender, nondistended, BS + Musculoskeletal: No edema, no cyanosis, no clubbing.  Good ROM, no contractures. Normal muscle tone.  Skin: no rashes, lesions, ulcers. No induration Neurologic: CN 2-12 grossly intact. Sensation intact, DTR normal.  Psychiatric: Normal judgment and insight. Alert and oriented x 2. Normal mood.     Labs on Admission: I have personally reviewed following labs and imaging studies  CBC: Recent Labs  Lab 02/02/21 1551  WBC 9.3  NEUTROABS 8.2*  HGB 11.3*  HCT 35.0*  MCV 93.6  PLT 126*   Basic Metabolic Panel: Recent Labs  Lab 02/02/21 1551  NA 127*  K 3.9  CL 92*  CO2 27  GLUCOSE 116*  BUN 32*  CREATININE 0.86  CALCIUM 8.5*   GFR: Estimated Creatinine Clearance: 24.8 mL/min (by C-G formula based on SCr of 0.86 mg/dL). Liver Function Tests: Recent Labs  Lab 02/02/21 1551  AST 50*  ALT 15  ALKPHOS 64  BILITOT 1.1  PROT 7.5  ALBUMIN 3.6   No results for input(s): LIPASE, AMYLASE in the last 168 hours. Recent Labs  Lab 02/02/21 1551  AMMONIA <10    Coagulation Profile: No results for input(s): INR, PROTIME in the last 168 hours. Cardiac Enzymes: No results for input(s): CKTOTAL, CKMB, CKMBINDEX, TROPONINI in the last 168 hours. BNP (last 3 results) No results for input(s): PROBNP in the last 8760 hours. HbA1C: No results for input(s): HGBA1C in the last 72 hours. CBG: No results for input(s): GLUCAP in the last 168 hours. Lipid Profile: No results for input(s): CHOL, HDL, LDLCALC, TRIG, CHOLHDL, LDLDIRECT in the last 72 hours. Thyroid Function Tests: Recent Labs    02/02/21 1551  TSH 4.013   Anemia Panel: No results for input(s): VITAMINB12, FOLATE, FERRITIN, TIBC, IRON, RETICCTPCT in the last 72 hours. Urine analysis:    Component Value Date/Time   COLORURINE YELLOW 02/02/2021 1710   APPEARANCEUR CLOUDY (A) 02/02/2021 1710   LABSPEC 1.020 02/02/2021 1710   PHURINE 5.5 02/02/2021 1710   GLUCOSEU NEGATIVE 02/02/2021 1710   HGBUR LARGE (A) 02/02/2021 1710   BILIRUBINUR NEGATIVE 02/02/2021 1710   KETONESUR NEGATIVE 02/02/2021 1710   PROTEINUR 100 (A) 02/02/2021 1710   NITRITE POSITIVE (A) 02/02/2021 1710   LEUKOCYTESUR LARGE (A) 02/02/2021 1710    Radiological Exams on Admission: DG Chest 2 View  Result Date: 02/02/2021 CLINICAL DATA:  AMS, ?SOB EXAM: CHEST - 2 VIEW COMPARISON:  10/19/2020. FINDINGS: Chronic increased lung markings. Mild left basilar opacities. No visible pleural effusions or pneumothorax. Cardiomediastinal silhouette is similar. Calcific atherosclerosis of the aorta. Vertebral augmentation at multiple thoracic levels. Polyarticular degenerative change. IMPRESSION: Mild left basilar opacities, likely atelectasis. Electronically Signed   By: Feliberto Harts M.D.   On: 02/02/2021 15:55   CT HEAD WO CONTRAST ( )  Result Date: 02/02/2021 CLINICAL DATA:  Mental status change, unknown cause EXAM: CT HEAD WITHOUT CONTRAST TECHNIQUE: Contiguous axial images were obtained from the base of the skull  through the vertex without intravenous contrast. COMPARISON:  04/28/2020 FINDINGS: Brain: There is no acute intracranial hemorrhage, mass effect, or edema. Gray-white differentiation is preserved. There is no extra-axial fluid collection. Stable prominence of ventricles and sulci reflecting parenchymal volume loss. Patchy and confluent hypoattenuation in the supratentorial white matter probably reflects stable chronic microvascular ischemic changes. Vascular: There is atherosclerotic calcification at the skull base. Skull: Calvarium is unremarkable. Sinuses/Orbits: Mild mucosal thickening. No acute orbital abnormality. Other: None. IMPRESSION: No acute intracranial abnormality. Stable chronic findings detailed above. Electronically Signed   By: Guadlupe Spanish M.D.   On: 02/02/2021 15:36  EKG: Ordered. please review  Assessment/Plan Principal Problem:   CHF exacerbation ([place]) Active Problems:   Osteoarthritis   Osteoporosis   PAD (peripheral artery disease) ([place])   Essential hypertension   Spinal stenosis of lumbar region  Acute on chronic hypoxic respiratory failure secondary to CHF exacerbation: Patient presented with generalized weakness and shortness of breath requiring continuous oxygen. BNP 1379.5, Chest x-ray :cardiomegaly.  She feels better after given Lasix. Continue supplemental oxygen, back to her baseline requirement. Continue Lasix 40 mg IV daily, daily weight, intake output charting  Acute systolic CHF: Presented with SOB with elevated BNP. Last echo LVEF 45 to 50%, mildly reduced LVEF Continue Lasix 40 mg daily. Start low-dose lisinopril 2.5 mg daily  Elevated troponin: Presented with troponin 367, denies any chest pain. EKG pending, seems due to demand ischemia from CHF. Continue to trend troponin if elevated, consider cardiology consult in morning Continue aspirin, Lipitor, metoprolol, Imdur  Essential hypertension: Continue metoprolol, Imdur  UTI: UA consistent  with UTI, continue ceftriaxone Follow-up urine cultures  Dementia: She has intermittent dementia.   She is fully alert oriented x2  Generalized weakness: Generalized weakness could be from CHF. PT and OT eval.  Hyponatremia: Could be due to decrease p.o. intake. Recheck a.m. labs.  DVT prophylaxis: Lovenox Code Status:  Family Communication: Daughter at bed side. Disposition Plan:   Status is: Inpatient  Remains inpatient appropriate because: Admitted for CHF exacerbation requiring IV Lasix.  Elevated troponin may require cardiology evaluation  Consults called: Cardiology Admission status: Inpatient   Shawna Clamp MD [facility]   If 7PM-7AM, please contact night-coverage  02/02/2021, 7:20 PM

## 2021-02-02 NOTE — ED Notes (Signed)
This RN spoke with [name], pts daughter & updated her. [name] states she will call in the AM before she comes to visit to check on pt.

## 2021-02-02 NOTE — ED Notes (Signed)
Troponin 367. Dr. Katrinka Blazing notified.

## 2021-02-02 NOTE — ED Notes (Signed)
Pt sleeping, resting comfortably in bed, NAD, chest rise & fall. No needs identified at this time. Bed low & locked; call light & personal items within reach. Purewick in place & functioning properly.

## 2021-02-02 NOTE — ED Provider Notes (Signed)
[facility] Emergency Department Provider Note  ____________________________________________   Event Date/Time   First MD Initiated Contact with Patient 02/02/21 1453     (approximate)  I have reviewed the triage vital signs and the nursing notes.   HISTORY  Chief Complaint Shortness of Breath   HPI Rebekah Bridges is a 87 y.o. female  with medical history significant for paroxysmal atrial fibrillation, osteoporosis, HTN, CHF and chronic hypoxic respiratory failure on 2 L nasal cannula as needed and recent admission 8/31-9/4 for management of an NSTEMI who presents accompanied by daughter-in-law from assisted living facility for assessment of several concerns including some increased weakness from baseline per daughter-in-law over the last 2 days as well as concerns that her oxygen was low on her 2 L at her nursing facility.  Patient denies any complaints and states she is not sure why she is in the emergency room.  Further history is limited from the patient secondary to her underlying baseline dementia and possibly additional component of encephalopathy.  Per daughter-in-law she has been much weaker and confused over the last few days.  She typically is able to get around with a walker but slipped to the floor 2 days ago.  She has not had any other injuries or falls and is not on anticoagulation only ASA at this time.  Patient has also had decreased appetite.  Daughter notes she had a cough about 2 weeks ago seems to have significantly improved and she does not seem to been coughing for much of the last couple days.  No other history is immediately available on patient presentation.  She had been discharged in September after a small heart attack with 2 L nasal cannula as needed but over the last 1 to 2 weeks has been using it at all times to maintain her SPO2 in due to shortness of breath.         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)    Hallux valgus with bunions of right foot    Acute pancreatitis 07/17/2019   Unspecified atrial fibrillation ([place]) 07/17/2019   Peripheral polyneuropathy 01/23/2019   Bilateral sacroiliitis ([place]) 11/05/2018   Body mass index (BMI) 26.0-26.9, adult 11/05/2018   Elevated blood-pressure reading, without diagnosis of hypertension 11/05/2018   Chronic low back pain without sciatica 01/07/2015   Degeneration of lumbar intervertebral disc 03/16/2014   Spinal stenosis of lumbar region 03/03/2014   Essential hypertension 02/06/2014   Back pain 01/30/2014   Neuropathy 01/30/2014   PAD (peripheral artery disease) ([place]) 08/28/2012   History of headache 08/28/2012   HYPERLIPIDEMIA 01/24/2010   OSTEOARTHRITIS 01/24/2010   BACK PAIN 01/24/2010   OSTEOPOROSIS 01/24/2010    Past Surgical History:  Procedure Laterality Date   ABDOMINAL AORTOGRAM W/LOWER EXTREMITY N/A 05/26/2020   Procedure: ABDOMINAL AORTOGRAM W/LOWER EXTREMITY;  Surgeon: Marty Heck, MD;  Location: [place] [facility];  Service: Cardiovascular;  Laterality: N/A;   BLADDER AUGMENTATION  1964   FEMORAL-POPLITEAL BYPASS GRAFT Right 06/27/2020   Procedure: RIGHT COOMMON FEMORAL TO ABOVE KNEE POPLITEAL ARTERY BYPASS GRAFT USING RIGHT GREATER SAPHENOUS VEIN;  Surgeon: Marty Heck, MD;  Location: [facility];  Service: Vascular;  Laterality: Right;   [address]  2010   SPINE SURGERY  2011   SPINE SURGERY  2012   WOUND DEBRIDEMENT Right 06/27/2020   Procedure: RIGHT FOOT PARTIAL RESECTION FIRST METATARSAL/ PHALANGEAL JOINT, IMPLANT NON-BIOLOGICAL DRUG IMPLANT;  Surgeon: Criselda Peaches, DPM;  Location: [place] [facility];  Service: Podiatry;  Laterality: Right;     Prior to Admission medications   Medication Sig Start Date End Date Taking? Authorizing Provider  aspirin 81 MG EC tablet Take 1 tablet (81 mg total) by mouth daily. Swallow whole. Patient taking differently: Take 81 mg by mouth every other day. Swallow whole. 06/30/20  Yes Setzer, [address], PA-C  beta carotene 25000 UNIT capsule Take 25,000 Units by mouth at bedtime.   Yes [provider]  cholecalciferol (VITAMIN D) 25 MCG (1000 UNIT) tablet Take 1,000 Units by mouth at bedtime.   Yes [provider]  furosemide (LASIX) 20 MG tablet Take 20 mg by mouth daily. 01/23/21  Yes [provider]  isosorbide mononitrate (IMDUR) 30 MG 24 hr tablet Take 0.5 tablets (15 mg total) by mouth daily. 10/22/20 02/02/21 Yes Enzo Bi, MD  metoprolol tartrate (LOPRESSOR) 25 MG tablet Take 1 tablet (25 mg total) by mouth 2 (two) times daily. 06/30/20  Yes Setzer, [address], PA-C  Multiple Vitamin (MULTIVITAMIN WITH MINERALS) TABS tablet Take 1 tablet by mouth at bedtime. One-A-Day Multivitamin   Yes [provider]  acetaminophen (TYLENOL) 325 MG tablet Take 650 mg by mouth every 6 (six) hours as needed. 01/05/21   [provider]  atorvastatin (LIPITOR) 80 MG tablet Take 1 tablet (80 mg total) by mouth daily. 10/22/20 01/20/21  Enzo Bi, MD  cadexomer iodine (IODOSORB) 0.9 % gel Apply 1 application topically daily as needed for wound care. Patient not taking: Reported on 02/02/2021 06/01/20   Criselda Peaches, DPM  calcium carbonate (TUMS - DOSED IN MG ELEMENTAL CALCIUM) 500 MG chewable tablet Chew 1 tablet by mouth 3 (three) times daily as needed for indigestion or heartburn.    [provider]  ibuprofen (ADVIL) 200 MG tablet Take 200 mg by mouth every 8 (eight) hours as needed (back/knee pain).    [provider]  RED YEAST RICE EXTRACT PO Take 1,200 mg by mouth at bedtime. Patient not taking: Reported on 02/02/2021    [provider]     Allergies Hydrocodone, Meloxicam, Other, Tramadol hcl, Penicillins, and Sulfa antibiotics  Family History  Problem Relation Age of Onset   Cancer Father    Learning disabilities Paternal Grandmother    Cancer Sister    Cancer Brother     Social History Social History   Tobacco Use   Smoking status: Never   Smokeless tobacco: Never  Vaping Use   Vaping Use: Never used  Substance Use Topics   Alcohol use: Not Currently   Drug use: No    Review of Systems  Review of Systems  Unable to perform ROS: Dementia     ____________________________________________   PHYSICAL EXAM:  VITAL SIGNS: ED Triage Vitals  Enc Vitals Group     BP 02/02/21 1436 114/73     Pulse Rate 02/02/21 1432 (!) 110     Resp 02/02/21 1432 19     Temp 02/02/21 1432 98.1 F (36.7 C)     Temp Source 02/02/21 1432 Oral     SpO2 02/02/21 1426 91 %     Weight 02/02/21 1433 126 lb (57.2 kg)     Height  02/02/21 1433 4\' 11"  (1.499 m)     Head Circumference --      Peak Flow --      Pain Score 02/02/21 1433 0     Pain Loc --      Pain Edu? --      Excl. in [place]? --    Vitals:   02/02/21 1550 02/02/21 1555  BP:    Pulse: (!) 105 99  Resp: 20 17  Temp:    SpO2: 100% 100%   Physical Exam Vitals and nursing note reviewed.  Constitutional:      General: She is not in acute distress.    Appearance: She is well-developed.  HENT:     Head: Normocephalic and atraumatic.     Right Ear: External ear normal.     Left Ear: External ear normal.     Nose: Nose normal.  Eyes:     Conjunctiva/sclera: Conjunctivae normal.  Cardiovascular:     Rate and Rhythm: Normal rate and regular rhythm.     Heart sounds: No murmur heard. Pulmonary:     Effort: Pulmonary effort is normal. No respiratory distress.     Breath sounds: Decreased breath sounds present.  Abdominal:     Palpations: Abdomen is soft.     Tenderness: There is no abdominal tenderness.  Musculoskeletal:        General: No swelling.      Cervical back: Neck supple.     Right lower leg: Edema (foot) present.     Left lower leg: Edema (foot) present.  Skin:    General: Skin is warm and dry.     Capillary Refill: Capillary refill takes less than 2 seconds.  Neurological:     Mental Status: She is alert. She is disoriented and confused.  Psychiatric:        Mood and Affect: Mood normal.    Patient is confused but pleasant and able to engage in some conversation.  She is able to follow commands in her extremities and seems preps very slightly weaker right hip flexion extension.  Sensation is intact light touch lower extremities.  2+ bilateral radial and DP pulses.  Cranial nerves II through XII are grossly intact. ____________________________________________   LABS (all labs ordered are listed, but only abnormal results are displayed)  Labs Reviewed  URINALYSIS, COMPLETE (UACMP) WITH MICROSCOPIC - Abnormal; Notable for the following components:      Result Value   APPearance CLOUDY (*)    Hgb urine dipstick LARGE (*)    Protein, ur 100 (*)    Nitrite POSITIVE (*)    Leukocytes,Ua LARGE (*)    Bacteria, UA MANY (*)    All other components within normal limits  CBC WITH DIFFERENTIAL/PLATELET - Abnormal; Notable for the following components:   RBC 3.74 (*)    Hemoglobin 11.3 (*)    HCT 35.0 (*)    RDW 16.3 (*)    Platelets 126 (*)    Neutro Abs 8.2 (*)    Lymphs Abs 0.5 (*)    All other components within normal limits  COMPREHENSIVE METABOLIC PANEL - Abnormal; Notable for the following components:   Sodium 127 (*)    Chloride 92 (*)    Glucose, Bld 116 (*)    BUN 32 (*)    Calcium 8.5 (*)    AST 50 (*)    GFR, Estimated 59 (*)    All other components within normal limits  BRAIN NATRIURETIC PEPTIDE - Abnormal; Notable for  the following components:   B Natriuretic Peptide 1,379.5 (*)    All other components within normal limits  TROPONIN I (HIGH SENSITIVITY) - Abnormal; Notable for the following components:    Troponin I (High Sensitivity) 367 (*)    All other components within normal limits  RESP PANEL BY RT-PCR (FLU A&B, COVID) [id number]  URINE CULTURE  TSH  AMMONIA  PROCALCITONIN  TROPONIN I (HIGH SENSITIVITY)   ____________________________________________  EKG  ECG shows A. fib with a ventricular rate of 94 and Q waves in anterior septal leads V2 and some nonspecific changes in inferior leads with some ST depression in aVF without other clearance of acute ischemia or significant arrhythmia. ____________________________________________  RADIOLOGY  ED MD interpretation:    CT head shows no evidence of ischemia, hemorrhage, mass-effect, or other clear acute process.  There is stable prominence of the ventricles and some chronic appearing parenchymal volume loss.  There is also evidence of some stable chronic microvascular ischemic changes.  Chest x-ray shows atelectasis at left base without other overt focal consolidation, edema, pneumothorax, effusion or other clear acute thoracic process.  Official radiology report(s): DG Chest 2 View  Result Date: 02/02/2021 CLINICAL DATA:  AMS, ?SOB EXAM: CHEST - 2 VIEW COMPARISON:  10/19/2020. FINDINGS: Chronic increased lung markings. Mild left basilar opacities. No visible pleural effusions or pneumothorax. Cardiomediastinal silhouette is similar. Calcific atherosclerosis of the aorta. Vertebral augmentation at multiple thoracic levels. Polyarticular degenerative change. IMPRESSION: Mild left basilar opacities, likely atelectasis. Electronically Signed   By: Feliberto Harts M.D.   On: 02/02/2021 15:55   CT HEAD WO CONTRAST ( )  Result Date: 02/02/2021 CLINICAL DATA:  Mental status change, unknown cause EXAM: CT HEAD WITHOUT CONTRAST TECHNIQUE: Contiguous axial images were obtained from the base of the skull through the vertex without intravenous contrast. COMPARISON:  04/28/2020 FINDINGS: Brain: There is no acute intracranial hemorrhage, mass  effect, or edema. Gray-white differentiation is preserved. There is no extra-axial fluid collection. Stable prominence of ventricles and sulci reflecting parenchymal volume loss. Patchy and confluent hypoattenuation in the supratentorial white matter probably reflects stable chronic microvascular ischemic changes. Vascular: There is atherosclerotic calcification at the skull base. Skull: Calvarium is unremarkable. Sinuses/Orbits: Mild mucosal thickening. No acute orbital abnormality. Other: None. IMPRESSION: No acute intracranial abnormality. Stable chronic findings detailed above. Electronically Signed   By: Guadlupe Spanish M.D.   On: 02/02/2021 15:36    ____________________________________________   PROCEDURES  Procedure(s) performed (including Critical Care):  .1-3 Lead EKG Interpretation Performed by: Gilles Chiquito, MD Authorized by: Gilles Chiquito, MD     Interpretation: non-specific     Rhythm: atrial fibrillation     Ectopy: none     ____________________________________________   INITIAL IMPRESSION / ASSESSMENT AND PLAN / ED COURSE      Patient presents with above-stated history exam accompanied by daughter at bedside for assessment of several concerns primarily seems around some increased weakness over the last 2 days associate with use of oxygen 24/7 with last 1 to 2 weeks when it previously Prescribed as needed since patient recently had a heart attack in September.  Seen patient had slipped 2 days ago but has not had any other falls and is denying any other pain has no significant evidence of trauma on exam.  She has a history of fairly of instrument nausea and history is somewhat limited from the patient.  Initially daughter noted she seemed may be a touch more confused than usual on reassessment daughter states  that actually her mental status is actually close to baseline.  Differential considerations for patient's presentation include arrhythmia, ACS, anemia, metabolic  derangements, CVA, CHF exacerbation, encephalopathy from acute infectious process, endocrine derangements and polypharmacy.  Well patient initially seemed to be slightly weaker right hip flexion extension she is able to move it on recheck with symmetric strength compared to the left and the seem to initially been effort dependent.  No other evidence of focal deficits to suggest CVA.  CT head shows no evidence of ischemia, hemorrhage, mass-effect, or other clear acute process.  There is stable prominence of the ventricles and some chronic appearing parenchymal volume loss.  There is also evidence of some stable chronic microvascular ischemic changes.  Chest x-ray shows atelectasis at left base without other overt focal consolidation, edema, pneumothorax, effusion or other clear acute thoracic process.  ECG with some nonspecific changes noted above.  Initial troponin is 367 compared to 16,003 months ago when patient was admitted for an NSTEMI.  Given she is denying any chest pain at this time I have a lower suspicion for occlusion MI and MR concern for some demand ischemia in the setting of heart failure exacerbation.  Patient does have elevated BNP at 1379 and lower extremity edema.  Ammonia is unremarkable and CMP shows no significant electrolyte metabolic derangements or evidence of cholestasis or hepatitis.  TSH is within normal limits.  CBC shows no leukocytosis or acute anemia.  Influenza PCR is negative.  UA is positive for cystitis with positive nitrites, large leukocyte esterase and greater than 50 WBCs with many bacteria.  I do not believe patient is septic.  Will obtain a urine culture and initiate antibiotics.  I will admit for further evaluation management of acute heart failure exacerbation.      ____________________________________________   FINAL CLINICAL IMPRESSION(S) / ED DIAGNOSES  Final diagnoses:  Acute on chronic congestive heart failure, unspecified heart failure type  ([place])  Acute cystitis without hematuria  Troponin I above reference range  Longstanding persistent atrial fibrillation (HCC)  Dementia, unspecified dementia severity, unspecified dementia type, unspecified whether behavioral, psychotic, or mood disturbance or anxiety (HCC)    Medications  furosemide (LASIX) injection 40 mg (has no administration in time range)  aspirin chewable tablet 324 mg (has no administration in time range)  cefTRIAXone (ROCEPHIN) 1 g in sodium chloride 0.9 % 100 mL IVPB (has no administration in time range)     ED Discharge Orders     None        Note:  This document was prepared using Dragon voice recognition software and may include unintentional dictation errors.    Lucrezia Starch, MD 02/02/21 [phone]

## 2021-02-03 LAB — MAGNESIUM: Magnesium: 2 mg/dL (ref 1.7–2.4)

## 2021-02-03 LAB — BASIC METABOLIC PANEL
Anion gap: 9 (ref 5–15)
BUN: 34 mg/dL — ABNORMAL HIGH (ref 8–23)
CO2: 26 mmol/L (ref 22–32)
Calcium: 8.2 mg/dL — ABNORMAL LOW (ref 8.9–10.3)
Chloride: 94 mmol/L — ABNORMAL LOW (ref 98–111)
Creatinine, Ser: 1.26 mg/dL — ABNORMAL HIGH (ref 0.44–1.00)
GFR, Estimated: 37 mL/min — ABNORMAL LOW (ref >60)
Glucose, Bld: 112 mg/dL — ABNORMAL HIGH (ref 70–99)
Potassium: 3.6 mmol/L (ref 3.5–5.1)
Sodium: 129 mmol/L — ABNORMAL LOW (ref 135–145)

## 2021-02-03 LAB — PHOSPHORUS: Phosphorus: 3.6 mg/dL (ref 2.5–4.6)

## 2021-02-03 NOTE — Evaluation (Signed)
Physical Therapy Evaluation Patient Details Name: Rebekah Bridges MRN: [id number] DOB: 09-24-1915 Today's Date: 02/03/2021  History of Present Illness  Rebekah Bridges is a 87 y/o delightful female w/ PMH: afib, osteoporosis, HTN, CHF and chronic hypoxic respiratory failure on 2Lnc PRN. She presented to ED via EMS from [facility] d/t concerns for low oxygen saturations; admitted for mangement of acute/chronic respiratory failure secondary to CHF exacerbation.  Clinical Impression  Patient resting in bed upon arrival to session; alert and oriented to basic information, follows commands and agreeabel to participation with session (voicing need for toileting).  Bilat UE/LE strength and ROM grossly symmetrical and WFL for basic transfers and mobility; no focal weakness appreciated.  Mild higher level balance deficits, mild limitations in functional endurance noted; but fair insight/awareness of needs.  Able to complete bed mobility with cga/close sup; sit/stand, basic transfers and gait (10') with RW, cga/min assist.  Demonstrates forward flexed posture, RW arms length anterior to patient.  Limited step height/length, limited balance reactions and overall functional activity tolerance.  Distance limited by need for toileting (Assisted to [facility] during session). Of note, trialed on RA throughout session; maintaining sats >93% at rest and with mobility.  Left on RA end of session; RN informed/aware. Would benefit from skilled PT to address above deficits and promote optimal return to PLOF.; Recommend transition to HHPT upon discharge from acute hospitalization.       Recommendations for follow up therapy are one component of a multi-disciplinary discharge planning process, led by the attending physician.  Recommendations may be updated based on patient status, additional functional criteria and insurance authorization.  Follow Up Recommendations Home health PT    Assistance Recommended at Discharge PRN   Functional Status Assessment Patient has had a recent decline in their functional status and demonstrates the ability to make significant improvements in function in a reasonable and predictable amount of time.  Equipment Recommendations   (has equipment needed)    Recommendations for Other Services       Precautions / Restrictions Precautions Precautions: Fall Restrictions Weight Bearing Restrictions: No      Mobility  Bed Mobility Overal bed mobility: Needs Assistance Bed Mobility: Supine to Sit     Supine to sit: Min guard;Supervision     General bed mobility comments: increased time and slight assist due to nature of stretcher    Transfers Overall transfer level: Needs assistance Equipment used: Rolling walker (2 wheels) Transfers: Sit to/from Stand Sit to Stand: Min guard;Min assist           General transfer comment: cuing for hand placement, tends to pull on RW; min assist for initial standing balance    Ambulation/Gait Ambulation/Gait assistance: Min guard Gait Distance (Feet): 10 Feet Assistive device: Rolling walker (2 wheels)         General Gait Details: forward flexed posture, RW arms length anterior to patient.  Limited step height/length, limited balance reactions and overall functional activity tolerance.  Distance limited by need for toileting (Assisted to [facility] during session).  Stairs            Wheelchair Mobility    Modified Rankin (Stroke Patients Only)       Balance Overall balance assessment: Needs assistance Sitting-balance support: No upper extremity supported;Feet supported Sitting balance-Leahy Scale: Good     Standing balance support: Bilateral upper extremity supported Standing balance-Leahy Scale: Fair  Pertinent Vitals/Pain Pain Assessment: No/denies pain    Home Living Family/patient expects to be discharged to:: Assisted living                 Home  Equipment: Rollator (4 wheels)      Prior Function Prior Level of Function : Needs assist       Physical Assist : ADLs (physical)   ADLs (physical): IADLs Mobility Comments: Pt able to walk short distances such as to dining room from her room with rollator; denies fall history; home O2 "when I needed it" (symptom recognition-dizziness, lightheadedness) ADLs Comments: facility-provided IADLs such as med mgt and meal prep. Aide assist for bathing     Hand Dominance   Dominant Hand: Right    Extremity/Trunk Assessment   Upper Extremity Assessment Upper Extremity Assessment: Overall WFL for tasks assessed    Lower Extremity Assessment Lower Extremity Assessment: Overall WFL for tasks assessed (grossly at least 4-/5 throughout; no focal weakness appreciated)       Communication   Communication: HOH  Cognition Arousal/Alertness: Awake/alert Behavior During Therapy: WFL for tasks assessed/performed Overall Cognitive Status: Within Functional Limits for tasks assessed                                 General Comments: Pt able to follow all basic commands, some decreased insight (ex: trying to sleep while seated on BSC). She is oriented to self, place, and some aspects of situation and time (oxygen, oriented to month/year)        General Comments      Exercises Other Exercises Other Exercises: Educated in role of PT and progressive mobilit, importance of consistent OOB opportunities (discontinuation of purewick), safety with transfers and mobility; patient voiced understanding and agreement.  Motivated to maintain indep as much as possible.   Assessment/Plan    PT Assessment Patient needs continued PT services  PT Problem List Decreased activity tolerance;Decreased balance;Decreased mobility;Cardiopulmonary status limiting activity;Decreased knowledge of use of DME;Decreased safety awareness;Decreased knowledge of precautions       PT Treatment Interventions  DME instruction;Gait training;Functional mobility training;Therapeutic activities;Therapeutic exercise;Balance training;Patient/family education    PT Goals (Current goals can be found in the Care Plan section)  Acute Rehab PT Goals Patient Stated Goal: "I need to go to the bathroom" PT Goal Formulation: With patient Time For Goal Achievement: 02/17/21 Potential to Achieve Goals: Good    Frequency Min 2X/week   Barriers to discharge        Co-evaluation               AM-PAC PT "6 Clicks" Mobility  Outcome Measure Help needed turning from your back to your side while in a flat bed without using bedrails?: None Help needed moving from lying on your back to sitting on the side of a flat bed without using bedrails?: None Help needed moving to and from a bed to a chair (including a wheelchair)?: A Little Help needed standing up from a chair using your arms (e.g., wheelchair or bedside chair)?: A Little Help needed to walk in hospital room?: A Little Help needed climbing 3-5 steps with a railing? : A Little 6 Click Score: 20    End of Session Equipment Utilized During Treatment: Gait belt Activity Tolerance: Patient tolerated treatment well Patient left:  (on BSC with OT at bedside to complete toileting and assist with return to recliner) Nurse Communication: Mobility status PT Visit  Diagnosis: Muscle weakness (generalized) (M62.81);Difficulty in walking, not elsewhere classified (R26.2)    Time: [id number] PT Time Calculation (min) (ACUTE ONLY): 17 min   Charges:   PT Evaluation $PT Eval Low Complexity: 1 Low         Kalan Yeley H. Owens Shark, PT, DPT, NCS 02/03/21, 11:45 AM [phone]

## 2021-02-03 NOTE — Consult Note (Signed)
[facility] Consultation Note  Patient ID: Rebekah Bridges, MRN: [id number], DOB/AGE: 85-May-1917 85 y.o. Admit date: 02/02/2021   Date of Consult: 02/03/2021 Primary Physician: System, Provider Not In Primary Cardiologist: None  Chief Complaint:  Chief Complaint  Patient presents with   Shortness of Breath   Reason for Consult:  Atrial fibrillation  HPI: 87 y.o. female with known chronic kidney disease and glomerular filtration rate of 37 with new onset cough congestion weakness and possible urinary tract infection with incidental finding of atrial fibrillation with rapid ventricular rate by EKG.  The patient does not have any current evidence of congestive heart failure or anginal symptoms although does have a troponin level of 334/367.  EKG has shown atrial fibrillation with rapid ventricular rate nonspecific ST changes.  Currently her heart rate is more controlled at 80 bpm.  The patient has had no evidence of significant symptoms from the cardiovascular standpoint but has likely exacerbation here due to significant infection.  Currently she is hemodynamically stable  Past Medical History:  Diagnosis Date   Osteoporosis    s/p vertebroplasty for fracutres x 3   PAF (paroxysmal atrial fibrillation) ([place]) 07/17/2019      Surgical History:  Past Surgical History:  Procedure Laterality Date   ABDOMINAL AORTOGRAM W/LOWER EXTREMITY N/A 05/26/2020   Procedure: ABDOMINAL AORTOGRAM W/LOWER EXTREMITY;  Surgeon: Marty Heck, MD;  Location: [facility];  Service: Cardiovascular;  Laterality: N/A;   BLADDER AUGMENTATION  1964   FEMORAL-POPLITEAL BYPASS GRAFT Right 06/27/2020   Procedure: RIGHT COOMMON FEMORAL TO ABOVE KNEE POPLITEAL ARTERY BYPASS GRAFT USING RIGHT GREATER SAPHENOUS VEIN;  Surgeon: Marty Heck, MD;  Location: [facility];  Service: Vascular;  Laterality: Right;   [place]   [place]  2011   SPINE SURGERY  2012   WOUND  DEBRIDEMENT Right 06/27/2020   Procedure: RIGHT FOOT PARTIAL RESECTION FIRST METATARSAL/ PHALANGEAL JOINT, IMPLANT NON-BIOLOGICAL DRUG IMPLANT;  Surgeon: Criselda Peaches, DPM;  Location: [facility];  Service: Podiatry;  Laterality: Right;     Home Meds: Prior to Admission medications   Medication Sig Start Date End Date Taking? Authorizing Provider  aspirin 81 MG EC tablet Take 1 tablet (81 mg total) by mouth daily. Swallow whole. Patient taking differently: Take 81 mg by mouth every other day. Swallow whole. 06/30/20  Yes Setzer, Edman Circle, PA-C  beta carotene 25000 UNIT capsule Take 25,000 Units by mouth at bedtime.   Yes [provider]  cholecalciferol (VITAMIN D) 25 MCG (1000 UNIT) tablet Take 1,000 Units by mouth at bedtime.   Yes [provider]  furosemide (LASIX) 20 MG tablet Take 20 mg by mouth daily. 01/23/21  Yes [provider]  isosorbide mononitrate (IMDUR) 30 MG 24 hr tablet Take 0.5 tablets (15 mg total) by mouth daily. 10/22/20 02/02/21 Yes Enzo Bi, MD  metoprolol tartrate (LOPRESSOR) 25 MG tablet Take 1 tablet (25 mg total) by mouth 2 (two) times daily. 06/30/20  Yes Setzer, Edman Circle, PA-C  Multiple Vitamin (MULTIVITAMIN WITH MINERALS) TABS tablet Take 1 tablet by mouth at bedtime. One-A-Day Multivitamin   Yes [provider]  acetaminophen (TYLENOL) 325 MG tablet Take 650 mg by mouth every 6 (six) hours as needed. 01/05/21   [provider]  atorvastatin (LIPITOR) 80 MG tablet Take 1 tablet (80 mg total) by mouth daily. 10/22/20 01/20/21  Enzo Bi, MD  cadexomer iodine (IODOSORB) 0.9 % gel Apply 1 application topically daily as  needed for wound care. Patient not taking: Reported on 02/02/2021 06/01/20   Criselda Peaches, DPM  calcium carbonate (TUMS - DOSED IN MG ELEMENTAL CALCIUM) 500 MG chewable tablet Chew 1 tablet by mouth 3 (three) times daily as needed for indigestion or heartburn.    [provider]  ibuprofen (ADVIL) 200 MG  tablet Take 200 mg by mouth every 8 (eight) hours as needed (back/knee pain).    [provider]  RED YEAST RICE EXTRACT PO Take 1,200 mg by mouth at bedtime. Patient not taking: Reported on 02/02/2021    [provider]    Inpatient Medications:   aspirin EC  81 mg Oral QODAY   atorvastatin  80 mg Oral Daily   enoxaparin (LOVENOX) injection  30 mg Subcutaneous Q24H   isosorbide mononitrate  15 mg Oral Daily   metoprolol tartrate  25 mg Oral BID   sodium chloride flush  3 mL Intravenous Q12H    sodium chloride     cefTRIAXone (ROCEPHIN)  IV      Allergies:  Allergies  Allergen Reactions   Hydrocodone Nausea Only   Meloxicam Nausea Only    Upset stomach   Other Other (See Comments)   Tramadol Hcl Nausea Only and Other (See Comments)    sweating   Penicillins Nausea Only and Rash   Sulfa Antibiotics Nausea Only, Hives and Rash    And rash    Social History   Socioeconomic History   Marital status: Widowed    Spouse name: Not on file   Number of children: Not on file   Years of education: Not on file   Highest education level: Not on file  Occupational History   Not on file  Tobacco Use   Smoking status: Never   Smokeless tobacco: Never  Vaping Use   Vaping Use: Never used  Substance and Sexual Activity   Alcohol use: Not Currently   Drug use: No   Sexual activity: Not on file  Other Topics Concern   Not on file  Social History Narrative   Not on file   Social Determinants of Health   Financial Resource Strain: Not on file  Food Insecurity: Not on file  Transportation Needs: Not on file  Physical Activity: Not on file  Stress: Not on file  Social Connections: Not on file  Intimate Partner Violence: Not on file     Family History  Problem Relation Age of Onset   Cancer Father    Learning disabilities Paternal Grandmother    Cancer Sister    Cancer Brother      Review of Systems Positive for cough  congestion Negative for: General:  chills, fever, night sweats or weight changes.  Cardiovascular: PND orthopnea syncope dizziness  Dermatological skin lesions rashes Respiratory: Positive for cough congestion Urologic: Frequent urination urination at night and hematuria Abdominal: negative for nausea, vomiting, diarrhea, bright red blood per rectum, melena, or hematemesis Neurologic: negative for visual changes, and/or hearing changes  All other systems reviewed and are otherwise negative except as noted above.  Labs: No results for input(s): CKTOTAL, CKMB, TROPONINI in the last 72 hours. Lab Results  Component Value Date   WBC 9.3 02/02/2021   HGB 11.3 (L) 02/02/2021   HCT 35.0 (L) 02/02/2021   MCV 93.6 02/02/2021   PLT 126 (L) 02/02/2021    Recent Labs  Lab 02/02/21 1551 02/03/21 0636  NA 127* 129*  K 3.9 3.6  CL 92* 94*  CO2 27  26  BUN 32* 34*  CREATININE 0.86 1.26*  CALCIUM 8.5* 8.2*  PROT 7.5  --   BILITOT 1.1  --   ALKPHOS 64  --   ALT 15  --   AST 50*  --   GLUCOSE 116* 112*   Lab Results  Component Value Date   CHOL 121 06/28/2020   HDL 44 06/28/2020   LDLCALC 64 06/28/2020   TRIG 63 06/28/2020   No results found for: DDIMER  Radiology/Studies:  DG Chest 2 View  Result Date: 02/02/2021 CLINICAL DATA:  AMS, ?SOB EXAM: CHEST - 2 VIEW COMPARISON:  10/19/2020. FINDINGS: Chronic increased lung markings. Mild left basilar opacities. No visible pleural effusions or pneumothorax. Cardiomediastinal silhouette is similar. Calcific atherosclerosis of the aorta. Vertebral augmentation at multiple thoracic levels. Polyarticular degenerative change. IMPRESSION: Mild left basilar opacities, likely atelectasis. Electronically Signed   By: Margaretha Sheffield M.D.   On: 02/02/2021 15:55   CT HEAD WO CONTRAST (5MM)  Result Date: 02/02/2021 CLINICAL DATA:  Mental status change, unknown cause EXAM: CT HEAD WITHOUT CONTRAST TECHNIQUE: Contiguous axial images were obtained  from the base of the skull through the vertex without intravenous contrast. COMPARISON:  04/28/2020 FINDINGS: Brain: There is no acute intracranial hemorrhage, mass effect, or edema. Gray-white differentiation is preserved. There is no extra-axial fluid collection. Stable prominence of ventricles and sulci reflecting parenchymal volume loss. Patchy and confluent hypoattenuation in the supratentorial white matter probably reflects stable chronic microvascular ischemic changes. Vascular: There is atherosclerotic calcification at the skull base. Skull: Calvarium is unremarkable. Sinuses/Orbits: Mild mucosal thickening. No acute orbital abnormality. Other: None. IMPRESSION: No acute intracranial abnormality. Stable chronic findings detailed above. Electronically Signed   By: Macy Mis M.D.   On: 02/02/2021 15:36    EKG: Atrial fibrillation rapid ventricular rate nonspecific ST changes  Weights: Filed Weights   02/02/21 1433  Weight: 57.2 kg     Physical Exam: Blood pressure 102/70, pulse 92, temperature 98.7 F (37.1 C), temperature source Oral, resp. rate (!) 22, height 4\' 11"  (1.499 m), weight 57.2 kg, SpO2 96 %. Body mass index is 25.45 kg/m. General: Well developed, well nourished, in no acute distress. Head eyes ears nose throat: Normocephalic, atraumatic, sclera non-icteric, no xanthomas, nares are without discharge. No apparent thyromegaly and/or mass  Lungs: Normal respiratory effort.  Few wheezes, no rales, some rhonchi.  Heart: Irregular with normal S1 S2. no murmur gallop, no rub, PMI is normal size and placement, carotid upstroke normal without bruit, jugular venous pressure is normal Abdomen: Soft, non-tender, non-distended with normoactive bowel sounds. No hepatomegaly. No rebound/guarding. No obvious abdominal masses. Abdominal aorta is normal size without bruit Extremities: No edema. no cyanosis, no clubbing, no ulcers  Peripheral : 2+ bilateral upper extremity pulses, 2+  bilateral femoral pulses, 2+ bilateral dorsal pedal pulse Neuro: Alert and oriented. No facial asymmetry. No focal deficit. Moves all extremities spontaneously. Musculoskeletal: Normal muscle tone without kyphosis Psych:  Responds to questions appropriately with a normal affect.    Assessment: 87 year old female with weakness fatigue cough and congestion with possible urinary tract infection having acute onset of atrial fibrillation with rapid ventricular rate and no current evidence of acute coronary syndrome and or congestive heart failure  Plan: 1.  Continue supportive care for acute infection and/or shortness of breath and wheezing 2.  Low-dose beta-blocker for heart rate control of atrial fibrillation with rapid ventricular rate 3.  Heparin for further risk reduction stroke with atrial fibrillation and possible deep venous  thrombosis and will make further decision tomorrow about the possibility of anticoagulation for further risk reduction and stroke as an outpatient. 4.  Echocardiogram for LV systolic dysfunction valvular heart disease contributing to above 5.  Begin ambulation and follow-up for improvements of symptoms per above  Signed, Lamar Blinks M.D. [facility] 02/03/2021, 3:28 PM

## 2021-02-03 NOTE — ED Notes (Signed)
Matthew RN aware of assigned bed 

## 2021-02-03 NOTE — Progress Notes (Signed)
Patient called out stating she was in pain. Patient reports pain in her back and hips. She was unable to sit still due to the pain. Patient reports sleeping in her lounge chair usually. Patient assisted to the recliner. Patient then begins to dry heave and states she is nauseated. Patient still restless and reporting pain in her back. EKG performed, second troponin sent, pain and nausea meds given. Charge and Floor Coverage NP notified. Will continue to monitor.

## 2021-02-03 NOTE — Progress Notes (Signed)
Patient asleep.

## 2021-02-03 NOTE — Evaluation (Signed)
Occupational Therapy Evaluation Patient Details Name: Rebekah Bridges MRN: [id number] DOB: 15-Apr-1915 Today's Date: 02/03/2021   History of Present Illness Kensley Valladares is a 87 y/o delightful female w/ PMH: afib, osteoporosis, HTN, CHF and chronic hypoxic respiratory failure on 2Lnc PRN. She presented to ED via EMS from [facility] d/t concerns for low oxygen saturations.   Clinical Impression   Pt seen for OT Evaluation this date in setting of acute hospitalization d/t decreased O2 saturations. She presents with improved spO2 >94% throughout during session on room air. RN updated on pt progress. Pt received on [facility] with PT finishing up session when OT presents. OT engages pt in toileting tasks with MIN A in standing with RW for UE support. She requires MOD A For LB dressing and reports she usually wears slip on shoes at baseline d/t decreased hip ROM. Pt requires CGA for ADL transfers and fxl mobility with RW and is returned to recliner with call light at end of OT session. Educated re: safety considerations, fall prevention and OT role. Will continue to follow acutely and anticipate pt can f/u with [facility] services in her ALF setting.      Recommendations for follow up therapy are one component of a multi-disciplinary discharge planning process, led by the attending physician.  Recommendations may be updated based on patient status, additional functional criteria and insurance authorization.   Follow Up Recommendations  Home health OT    Assistance Recommended at Discharge Intermittent Supervision/Assistance  Functional Status Assessment  Patient has had a recent decline in their functional status and demonstrates the ability to make significant improvements in function in a reasonable and predictable amount of time.  Equipment Recommendations  BSC/3in1;Tub/shower seat    Recommendations for Other Services       Precautions / Restrictions Precautions Precautions: Fall Restrictions Weight  Bearing Restrictions: No      Mobility Bed Mobility               General bed mobility comments: up to [facility] pre and recliner post OT session    Transfers Overall transfer level: Needs assistance Equipment used: Rolling walker (2 wheels) Transfers: Sit to/from Stand Sit to Stand: Min guard           General transfer comment: increased time, cues for safety wtih 2WW as pt is used to using 4WW at home      Balance Overall balance assessment: Mild deficits observed, not formally tested Sitting-balance support: Feet supported Sitting balance-Leahy Scale: Good     Standing balance support: Bilateral upper extremity supported Standing balance-Leahy Scale: Fair                             ADL either performed or assessed with clinical judgement   ADL                                         General ADL Comments: SETUP for seated UB ADLs, MOD A for LB ADLs.     Vision Patient Visual Report: No change from baseline       Perception     Praxis      Pertinent Vitals/Pain Pain Assessment: No/denies pain     Hand Dominance Right   Extremity/Trunk Assessment Upper Extremity Assessment Upper Extremity Assessment: Overall WFL for tasks assessed;Generalized weakness (ROMW FL, MMT grossly/4-/5)  Lower Extremity Assessment Lower Extremity Assessment: Overall WFL for tasks assessed;Generalized weakness (limited hip ROM as it pertains to LB ADLs, but functional for mobility. MMT grossly 4-/5 DF/PF.)       Communication Communication Communication: HOH   Cognition Arousal/Alertness: Awake/alert Behavior During Therapy: WFL for tasks assessed/performed Overall Cognitive Status: Difficult to assess                                 General Comments: Pt able to follow all basic commands, some decreased insight (ex: trying to sleep while seated on BSC). She is oriented to self, place, and some aspects of situation and time  (oxygen, oriented to month/year)     General Comments       Exercises Other Exercises Other Exercises: OT ed with pt re: safety considerations including use of call button and safe use of 2WW   Shoulder Instructions      Home Living Family/patient expects to be discharged to:: Assisted living                             Home Equipment: Rollator (4 wheels)          Prior Functioning/Environment Prior Level of Function : Needs assist       Physical Assist : ADLs (physical)   ADLs (physical): IADLs Mobility Comments: Pt able to walk short distances such as to dining room from her room with rollator ADLs Comments: facility-provided IADLs such as med mgt and meal prep. Aide assist for bathing        OT Problem List: Decreased strength;Decreased activity tolerance      OT Treatment/Interventions: Self-care/ADL training;Therapeutic exercise;Therapeutic activities    OT Goals(Current goals can be found in the care plan section) Acute Rehab OT Goals Patient Stated Goal: to get O2 under control and go back to ALF OT Goal Formulation: With patient Time For Goal Achievement: 02/17/21 Potential to Achieve Goals: Good  OT Frequency: Min 2X/week   Barriers to D/C:            Co-evaluation              AM-PAC OT "6 Clicks" Daily Activity     Outcome Measure Help from another person eating meals?: None Help from another person taking care of personal grooming?: A Little Help from another person toileting, which includes using toliet, bedpan, or urinal?: A Little Help from another person bathing (including washing, rinsing, drying)?: A Lot Help from another person to put on and taking off regular upper body clothing?: None Help from another person to put on and taking off regular lower body clothing?: A Lot 6 Click Score: 18   End of Session Equipment Utilized During Treatment: Gait belt;Rolling walker (2 wheels) Nurse Communication: Mobility  status  Activity Tolerance: Patient tolerated treatment well Patient left: in chair;with call bell/phone within reach  OT Visit Diagnosis: Muscle weakness (generalized) (M62.81)                Time: [id number] OT Time Calculation (min): 9 min Charges:  OT General Charges $OT Visit: 1 Visit OT Evaluation $OT Eval Low Complexity: 1 Low  Gerrianne Scale, MS, OTR/L ascom [phone] 02/03/21, 10:50 AM

## 2021-02-03 NOTE — Progress Notes (Signed)
Patient still restless and complaining about her back. Patient repositioned in recliner and given warm blanket. Patient resting with eyes closed.

## 2021-02-03 NOTE — Progress Notes (Signed)
PROGRESS NOTE    Rebekah Bridges  [id number] DOB: 1915/11/03 DOA: 02/02/2021 PCP: System, Provider Not In    Brief Narrative:  87 year old female with history of hypertension, chronic combined heart failure, osteoporosis, paroxysmal A. fib, chronic respiratory failure on 2 L oxygen from assisted living facility presented to the ER with multiple complaints including burning urination, weakness and falling 2 days ago without any physical injury.  In the emergency room heart rate 110, afebrile.  100% on 2 L oxygen.  Sodium 127.  Urinalysis with likely infection.  CT head normal.  BNP elevated 1379.  Admitted with UTI.  Initially suspected congestive heart failure exacerbation.   Assessment & Plan:   Principal Problem:   CHF exacerbation ([place]) Active Problems:   Osteoarthritis   Osteoporosis   PAD (peripheral artery disease) ([place])   Essential hypertension   Spinal stenosis of lumbar region  Suspected acute on chronic combined heart failure exacerbation: Chronic hypoxemic respiratory failure. Mildly elevated troponins without chest pain.  EKG nonischemic. Known ejection fraction 45 to 50% with mildly reduced ventricular ejection fraction.  Patient is fairly compensated and euvolemic. Patient is already on heart failure therapy with Lasix 40 mg daily at home, metoprolol and nitrates.  She is well compensated.  Given 1 dose of IV Lasix in the emergency room. To avoid further decompensation, renal failure and orthostatic symptoms, we will continue her current heart failure regimen as she is euvolemic.  Hold Lasix today with slightly elevated creatinine from baseline. She uses 2 L oxygen at home and saturating 100%. Mildly elevated troponins are likely secondary to demand ischemia.  No evidence of acute coronary syndrome.  Continue aspirin, statin, beta-blockers and nitrates.  Acute UTI present on admission: Likely related to her current presentation.  Started on Rocephin.  Urine  cultures pending.  Hypochloremic hyponatremia: Likely related to low oral intake.  Also possible intravascular volume overload due to CHF. Sodium level slightly improving.  Received Lasix.  Supportive treatment.  Deconditioning: PT OT.  Recommended home health PT OT.   DVT prophylaxis: enoxaparin (LOVENOX) injection 30 mg Start: 02/02/21 2200   Code Status: DNR Family Communication: Patient's daughter on the phone Disposition Plan: Status is: Inpatient  Remains inpatient appropriate because: Needing IV antibiotics for UTI, needing assessment for safe discharge back to ALF.    Consultants:  None  Procedures:  None  Antimicrobials:  Rocephin 12/15---   Subjective: Patient seen and examined.  Is still in the emergency room.  Pleasant.  Denies any complaints.  Sitting in bedside couch and eating breakfast.  She does agree that she had some urinary symptoms.  She thinks she has UTI.  Objective: Vitals:   02/03/21 0400 02/03/21 0408 02/03/21 0500 02/03/21 0600  BP: (!) 87/58 93/62 91/63  96/68  Pulse: 87 95 86 94  Resp: 18 16 17 14   Temp:      TempSrc:      SpO2: 99% 99% 99% 98%  Weight:      Height:        Intake/Output Summary (Last 24 hours) at 02/03/2021 1434 Last data filed at 02/02/2021 1827 Gross per 24 hour  Intake 100 ml  Output --  Net 100 ml   Filed Weights   02/02/21 1433  Weight: 57.2 kg    Examination:  General exam: Appears calm and comfortable  Comfortable on 2 L oxygen.  Patient looks younger than his stated age. She is alert oriented x4.  Generalized weakness but no focal deficits. Respiratory system:  No added sounds. Cardiovascular system: S1 & S2 heard, no edema.   Gastrointestinal system: Soft.  Nontender.  Bowel sound present. Psychiatry: Judgement and insight appear normal. Mood & affect appropriate.     Data Reviewed: I have personally reviewed following labs and imaging studies  CBC: Recent Labs  Lab 02/02/21 1551  WBC 9.3   NEUTROABS 8.2*  HGB 11.3*  HCT 35.0*  MCV 93.6  PLT 126*   Basic Metabolic Panel: Recent Labs  Lab 02/02/21 1551 02/03/21 0636  NA 127* 129*  K 3.9 3.6  CL 92* 94*  CO2 27 26  GLUCOSE 116* 112*  BUN 32* 34*  CREATININE 0.86 1.26*  CALCIUM 8.5* 8.2*  MG  --  2.0  PHOS  --  3.6   GFR: Estimated Creatinine Clearance: 16.9 mL/min (A) (by C-G formula based on SCr of 1.26 mg/dL (H)). Liver Function Tests: Recent Labs  Lab 02/02/21 1551  AST 50*  ALT 15  ALKPHOS 64  BILITOT 1.1  PROT 7.5  ALBUMIN 3.6   No results for input(s): LIPASE, AMYLASE in the last 168 hours. Recent Labs  Lab 02/02/21 1551  AMMONIA <10   Coagulation Profile: No results for input(s): INR, PROTIME in the last 168 hours. Cardiac Enzymes: No results for input(s): CKTOTAL, CKMB, CKMBINDEX, TROPONINI in the last 168 hours. BNP (last 3 results) No results for input(s): PROBNP in the last 8760 hours. HbA1C: No results for input(s): HGBA1C in the last 72 hours. CBG: No results for input(s): GLUCAP in the last 168 hours. Lipid Profile: No results for input(s): CHOL, HDL, LDLCALC, TRIG, CHOLHDL, LDLDIRECT in the last 72 hours. Thyroid Function Tests: Recent Labs    02/02/21 1551  TSH 4.013   Anemia Panel: No results for input(s): VITAMINB12, FOLATE, FERRITIN, TIBC, IRON, RETICCTPCT in the last 72 hours. Sepsis Labs: Recent Labs  Lab 02/02/21 1551  PROCALCITON 2.61    Recent Results (from the past 240 hour(s))  Resp Panel by RT-PCR (Flu A&B, Covid) Nasopharyngeal Swab     Status: None   Collection Time: 02/02/21  3:51 PM   Specimen: Nasopharyngeal Swab; Nasopharyngeal(NP) swabs in vial transport medium  Result Value Ref Range Status   SARS Coronavirus 2 by RT PCR NEGATIVE NEGATIVE Final    Comment: (NOTE) SARS-CoV-2 target nucleic acids are NOT DETECTED.  The SARS-CoV-2 RNA is generally detectable in upper respiratory specimens during the acute phase of infection. The  lowest concentration of SARS-CoV-2 viral copies this assay can detect is 138 copies/mL. A negative result does not preclude SARS-Cov-2 infection and should not be used as the sole basis for treatment or other patient management decisions. A negative result may occur with  improper specimen collection/handling, submission of specimen other than nasopharyngeal swab, presence of viral mutation(s) within the areas targeted by this assay, and inadequate number of viral copies(<138 copies/mL). A negative result must be combined with clinical observations, patient history, and epidemiological information. The expected result is Negative.  Fact Sheet for Patients:  BloggerCourse.com  Fact Sheet for Healthcare Providers:  SeriousBroker.it  This test is no t yet approved or cleared by the [place] FDA and  has been authorized for detection and/or diagnosis of SARS-CoV-2 by FDA under an Emergency Use Authorization (EUA). This EUA will remain  in effect (meaning this test can be used) for the duration of the COVID-19 declaration under Section 564(b)(1) of the Act, 21 U.S.C.section 360bbb-3(b)(1), unless the authorization is terminated  or revoked sooner.  Influenza A by PCR NEGATIVE NEGATIVE Final   Influenza B by PCR NEGATIVE NEGATIVE Final    Comment: (NOTE) The Xpert Xpress SARS-CoV-2/FLU/RSV plus assay is intended as an aid in the diagnosis of influenza from Nasopharyngeal swab specimens and should not be used as a sole basis for treatment. Nasal washings and aspirates are unacceptable for Xpert Xpress SARS-CoV-2/FLU/RSV testing.  Fact Sheet for Patients: EntrepreneurPulse.com.au  Fact Sheet for Healthcare Providers: IncredibleEmployment.be  This test is not yet approved or cleared by the [place] FDA and has been authorized for detection and/or diagnosis of SARS-CoV-2 by FDA under  an Emergency Use Authorization (EUA). This EUA will remain in effect (meaning this test can be used) for the duration of the COVID-19 declaration under Section 564(b)(1) of the Act, 21 U.S.C. section 360bbb-3(b)(1), unless the authorization is terminated or revoked.  Performed at [facility], [address], [place], [place] [address]          Radiology Studies: DG Chest 2 View  Result Date: 02/02/2021 CLINICAL DATA:  AMS, ?SOB EXAM: CHEST - 2 VIEW COMPARISON:  10/19/2020. FINDINGS: Chronic increased lung markings. Mild left basilar opacities. No visible pleural effusions or pneumothorax. Cardiomediastinal silhouette is similar. Calcific atherosclerosis of the aorta. Vertebral augmentation at multiple thoracic levels. Polyarticular degenerative change. IMPRESSION: Mild left basilar opacities, likely atelectasis. Electronically Signed   By: Margaretha Sheffield M.D.   On: 02/02/2021 15:55   CT HEAD WO CONTRAST (5MM)  Result Date: 02/02/2021 CLINICAL DATA:  Mental status change, unknown cause EXAM: CT HEAD WITHOUT CONTRAST TECHNIQUE: Contiguous axial images were obtained from the base of the skull through the vertex without intravenous contrast. COMPARISON:  04/28/2020 FINDINGS: Brain: There is no acute intracranial hemorrhage, mass effect, or edema. Gray-white differentiation is preserved. There is no extra-axial fluid collection. Stable prominence of ventricles and sulci reflecting parenchymal volume loss. Patchy and confluent hypoattenuation in the supratentorial white matter probably reflects stable chronic microvascular ischemic changes. Vascular: There is atherosclerotic calcification at the skull base. Skull: Calvarium is unremarkable. Sinuses/Orbits: Mild mucosal thickening. No acute orbital abnormality. Other: None. IMPRESSION: No acute intracranial abnormality. Stable chronic findings detailed above. Electronically Signed   By: Macy Mis M.D.   On: 02/02/2021 15:36         Scheduled Meds:  aspirin EC  81 mg Oral QODAY   atorvastatin  80 mg Oral Daily   enoxaparin (LOVENOX) injection  30 mg Subcutaneous Q24H   isosorbide mononitrate  15 mg Oral Daily   metoprolol tartrate  25 mg Oral BID   sodium chloride flush  3 mL Intravenous Q12H   Continuous Infusions:  sodium chloride     cefTRIAXone (ROCEPHIN)  IV       LOS: 1 day    Time spent: 30 minutes    Barb Merino, MD [facility] Pager [phone]

## 2021-02-04 LAB — URINE CULTURE: Culture: >=100000 — AB

## 2021-02-04 LAB — BASIC METABOLIC PANEL
Anion gap: 9 (ref 5–15)
BUN: 37 mg/dL — ABNORMAL HIGH (ref 8–23)
CO2: 27 mmol/L (ref 22–32)
Calcium: 7.9 mg/dL — ABNORMAL LOW (ref 8.9–10.3)
Chloride: 92 mmol/L — ABNORMAL LOW (ref 98–111)
Creatinine, Ser: 1.1 mg/dL — ABNORMAL HIGH (ref 0.44–1.00)
GFR, Estimated: 44 mL/min — ABNORMAL LOW (ref >60)
Glucose, Bld: 96 mg/dL (ref 70–99)
Potassium: 3.8 mmol/L (ref 3.5–5.1)
Sodium: 128 mmol/L — ABNORMAL LOW (ref 135–145)

## 2021-02-04 MED ORDER — APIXABAN 2.5 MG PO TABS
2.5000 mg | ORAL_TABLET | Freq: Two times a day (BID) | ORAL | Status: DC
  Administered 2021-02-04 – 2021-02-08 (×9): 2.5 mg via ORAL
  Filled 2021-02-04 (×9): qty 1

## 2021-02-04 MED ORDER — METOPROLOL TARTRATE 50 MG PO TABS
50.0000 mg | ORAL_TABLET | Freq: Two times a day (BID) | ORAL | Status: DC
  Administered 2021-02-04 – 2021-02-08 (×9): 50 mg via ORAL
  Filled 2021-02-04 (×9): qty 1

## 2021-02-04 NOTE — Progress Notes (Signed)
[facility] Encounter Note  Patient: Rebekah Bridges / Admit Date: 02/02/2021 / Date of Encounter: 02/04/2021, 7:36 AM   Subjective: Patient appears overall improved from admission.  Less weakness fatigue shortness of breath cough and congestion.  Patient has been receiving antibiotics for possible infection exacerbating issues.  Patient does have new onset atrial fibrillation with slightly improved heart rate control.  No evidence of heart failure type symptoms or angina at this time  Review of Systems: Positive for: Shortness of breath Negative for: Vision change, hearing change, syncope, dizziness, nausea, vomiting,diarrhea, bloody stool, stomach pain, cough, congestion, diaphoresis, urinary frequency, urinary pain,skin lesions, skin rashes Others previously listed  Objective: Telemetry: Atrial fibrillation with controlled ventricular rate Physical Exam: Blood pressure 122/70, pulse 93, temperature (!) 101.3 F (38.5 C), temperature source Oral, resp. rate 17, height 4\' 11"  (1.499 m), weight 53.1 kg, SpO2 93 %. Body mass index is 23.64 kg/m. General: Well developed, well nourished, in no acute distress. Head: Normocephalic, atraumatic, sclera non-icteric, no xanthomas, nares are without discharge. Neck: No apparent masses Lungs: Normal respirations with few new wheezes, some rhonchi, no rales , no crackles   Heart: Irregular rate and rhythm, normal S1 S2, no murmur, no rub, no gallop, PMI is normal size and placement, carotid upstroke normal without bruit, jugular venous pressure normal Abdomen: Soft, non-tender, non-distended with normoactive bowel sounds. No hepatosplenomegaly. Abdominal aorta is normal size without bruit Extremities: No edema, no clubbing, no cyanosis, no ulcers,  Peripheral: 2+ radial, 2+ femoral, 2+ dorsal pedal pulses Neuro: Alert and oriented. Moves all extremities spontaneously. Psych:  Responds to questions appropriately with a normal  affect.   Intake/Output Summary (Last 24 hours) at 02/04/2021 0736 Last data filed at 02/04/2021 0401 Gross per 24 hour  Intake 100 ml  Output --  Net 100 ml    Inpatient Medications:   aspirin EC  81 mg Oral QODAY   atorvastatin  80 mg Oral Daily   enoxaparin (LOVENOX) injection  30 mg Subcutaneous Q24H   isosorbide mononitrate  15 mg Oral Daily   metoprolol tartrate  25 mg Oral BID   sodium chloride flush  3 mL Intravenous Q12H   Infusions:   sodium chloride     cefTRIAXone (ROCEPHIN)  IV Stopped (02/03/21 1857)    Labs: Recent Labs    02/03/21 0636 02/04/21 0435  NA 129* 128*  K 3.6 3.8  CL 94* 92*  CO2 26 27  GLUCOSE 112* 96  BUN 34* 37*  CREATININE 1.26* 1.10*  CALCIUM 8.2* 7.9*  MG 2.0  --   PHOS 3.6  --    Recent Labs    02/02/21 1551  AST 50*  ALT 15  ALKPHOS 64  BILITOT 1.1  PROT 7.5  ALBUMIN 3.6   Recent Labs    02/02/21 1551  WBC 9.3  NEUTROABS 8.2*  HGB 11.3*  HCT 35.0*  MCV 93.6  PLT 126*   No results for input(s): CKTOTAL, CKMB, TROPONINI in the last 72 hours. Invalid input(s): POCBNP No results for input(s): HGBA1C in the last 72 hours.   Weights: Filed Weights   02/02/21 1433 02/04/21 0500  Weight: 57.2 kg 53.1 kg     Radiology/Studies:  DG Chest 2 View  Result Date: 02/02/2021 CLINICAL DATA:  AMS, ?SOB EXAM: CHEST - 2 VIEW COMPARISON:  10/19/2020. FINDINGS: Chronic increased lung markings. Mild left basilar opacities. No visible pleural effusions or pneumothorax. Cardiomediastinal silhouette is similar. Calcific atherosclerosis of the aorta. Vertebral  augmentation at multiple thoracic levels. Polyarticular degenerative change. IMPRESSION: Mild left basilar opacities, likely atelectasis. Electronically Signed   By: Feliberto Harts M.D.   On: 02/02/2021 15:55   CT HEAD WO CONTRAST ( )  Result Date: 02/02/2021 CLINICAL DATA:  Mental status change, unknown cause EXAM: CT HEAD WITHOUT CONTRAST TECHNIQUE:  Contiguous axial images were obtained from the base of the skull through the vertex without intravenous contrast. COMPARISON:  04/28/2020 FINDINGS: Brain: There is no acute intracranial hemorrhage, mass effect, or edema. Gray-white differentiation is preserved. There is no extra-axial fluid collection. Stable prominence of ventricles and sulci reflecting parenchymal volume loss. Patchy and confluent hypoattenuation in the supratentorial white matter probably reflects stable chronic microvascular ischemic changes. Vascular: There is atherosclerotic calcification at the skull base. Skull: Calvarium is unremarkable. Sinuses/Orbits: Mild mucosal thickening. No acute orbital abnormality. Other: None. IMPRESSION: No acute intracranial abnormality. Stable chronic findings detailed above. Electronically Signed   By: Guadlupe Spanish M.D.   On: 02/02/2021 15:36     Assessment and Recommendation  87 y.o. female with chronic kidney disease stage III with acute infection elevated troponin possibly consistent with demand ischemia and atrial fibrillation with rapid ventricular rate now better controlled 1.  Increase metoprolol for better heart rate control with goal heart rate between 60 and 90 bpm 2.  Echocardiogram for LV systolic dysfunction valvular heart disease 3.  Patient may benefit from anticoagulation for further risk reduction and stroke with atrial fibrillation and Eliquis 2.5 mg twice per day 4.  Continue supportive care of infection exacerbating above  Signed, Arnoldo Hooker M.D. FACC

## 2021-02-04 NOTE — Progress Notes (Signed)
PROGRESS NOTE    Rebekah Bridges  VVO:[id number] DOB: 1915-03-22 DOA: 02/02/2021 PCP: System, Provider Not In    Brief Narrative:  87 year old female with history of hypertension, chronic combined heart failure, osteoporosis, paroxysmal A. fib, chronic respiratory failure on 2 L oxygen from assisted living facility presented to the ER with multiple complaints including burning urination, weakness and falling 2 days ago without any physical injury.  In the emergency room heart rate 110, afebrile.  100% on 2 L oxygen.  Sodium 127.  Urinalysis with likely infection.  CT head normal.  BNP elevated 1379.  Admitted with UTI.  Initially suspected congestive heart failure exacerbation.   Assessment & Plan:   Principal Problem:   CHF exacerbation (HCC) Active Problems:   Osteoarthritis   Osteoporosis   PAD (peripheral artery disease) (HCC)   Essential hypertension   Spinal stenosis of lumbar region  Suspected acute on chronic combined heart failure exacerbation: Chronic hypoxemic respiratory failure. Mildly elevated troponins without chest pain.  EKG nonischemic. Known ejection fraction 45 to 50% with mildly reduced ventricular ejection fraction. Patient is fairly compensated and euvolemic. Patient is already on heart failure therapy with Lasix 40 mg daily at home, metoprolol and nitrates.  She uses 2 L oxygen at home and saturating 100%. Mildly elevated troponins are likely secondary to demand ischemia.  No evidence of acute coronary syndrome.  On aspirin, statin, beta-blockers and nitrates. Seen by cardiology.  Discontinue aspirin as starting on Eliquis.  Paroxysmal A. fib: Atrial fibrillation. Seen by cardiology.  Started on anticoagulation with Eliquis.  Acute UTI present on admission: E. coli pansensitive.  Continue Rocephin due to ongoing temperature.  No evidence of urinary retention.  Hypochloremic hyponatremia: Likely related to low oral intake.  Also possible intravascular  volume overload due to CHF. Sodium level slightly improving.  Received Lasix.  Supportive treatment.  Deconditioning: PT OT.  Recommended home health PT OT.   DVT prophylaxis: apixaban (ELIQUIS) tablet 2.5 mg Start: 02/04/21 1000 apixaban (ELIQUIS) tablet 2.5 mg   Code Status: DNR Family Communication: Patient's daughter on the phone 12/16. Disposition Plan: Status is: Inpatient  Remains inpatient appropriate because: Needing IV antibiotics for UTI, needing assessment for safe discharge back to ALF.    Consultants:  Cardiology  Procedures:  None  Antimicrobials:  Rocephin 12/15---   Subjective:  Patient seen and examined.  Patient herself denies any complaints.  Denies any nausea vomiting.  Still has occasional burning urination.  Denies any abdomen pain or flank pain. T-max 101 overnight. Telemetry with rate controlled A. fib.  Objective: Vitals:   02/03/21 2339 02/04/21 0359 02/04/21 0500 02/04/21 0733  BP: 100/62 117/67  122/70  Pulse: 95 100  93  Resp: 20 20  17   Temp: 99.9 F (37.7 C) 99.8 F (37.7 C)  (!) 101.3 F (38.5 C)  TempSrc: Oral   Oral  SpO2: 95% 95%  93%  Weight:   53.1 kg   Height:        Intake/Output Summary (Last 24 hours) at 02/04/2021 1126 Last data filed at 02/04/2021 0401 Gross per 24 hour  Intake 100 ml  Output --  Net 100 ml   Filed Weights   02/02/21 1433 02/04/21 0500  Weight: 57.2 kg 53.1 kg    Examination:  General exam: Appears calm and comfortable  Eating breakfast.  Today on room air.  Patient looks younger than her stated age. She is alert oriented x4.  Generalized weakness but no focal deficits. Respiratory system:  No added sounds. Cardiovascular system: S1 & S2 heard, no edema.  Irregularly irregular. Gastrointestinal system: Soft.  Nontender.  Bowel sound present. Psychiatry: Judgement and insight appear normal. Mood & affect appropriate.     Data Reviewed: I have personally reviewed following labs and  imaging studies  CBC: Recent Labs  Lab 02/02/21 1551  WBC 9.3  NEUTROABS 8.2*  HGB 11.3*  HCT 35.0*  MCV 93.6  PLT 123XX123*   Basic Metabolic Panel: Recent Labs  Lab 02/02/21 1551 02/03/21 0636 02/04/21 0435  NA 127* 129* 128*  K 3.9 3.6 3.8  CL 92* 94* 92*  CO2 27 26 27   GLUCOSE 116* 112* 96  BUN 32* 34* 37*  CREATININE 0.86 1.26* 1.10*  CALCIUM 8.5* 8.2* 7.9*  MG  --  2.0  --   PHOS  --  3.6  --    GFR: Estimated Creatinine Clearance: 18.7 mL/min (A) (by C-G formula based on SCr of 1.1 mg/dL (H)). Liver Function Tests: Recent Labs  Lab 02/02/21 1551  AST 50*  ALT 15  ALKPHOS 64  BILITOT 1.1  PROT 7.5  ALBUMIN 3.6   No results for input(s): LIPASE, AMYLASE in the last 168 hours. Recent Labs  Lab 02/02/21 1551  AMMONIA <10   Coagulation Profile: No results for input(s): INR, PROTIME in the last 168 hours. Cardiac Enzymes: No results for input(s): CKTOTAL, CKMB, CKMBINDEX, TROPONINI in the last 168 hours. BNP (last 3 results) No results for input(s): PROBNP in the last 8760 hours. HbA1C: No results for input(s): HGBA1C in the last 72 hours. CBG: No results for input(s): GLUCAP in the last 168 hours. Lipid Profile: No results for input(s): CHOL, HDL, LDLCALC, TRIG, CHOLHDL, LDLDIRECT in the last 72 hours. Thyroid Function Tests: Recent Labs    02/02/21 1551  TSH 4.013   Anemia Panel: No results for input(s): VITAMINB12, FOLATE, FERRITIN, TIBC, IRON, RETICCTPCT in the last 72 hours. Sepsis Labs: Recent Labs  Lab 02/02/21 1551  PROCALCITON 2.61    Recent Results (from the past 240 hour(s))  Resp Panel by RT-PCR (Flu A&B, Covid) Nasopharyngeal Swab     Status: None   Collection Time: 02/02/21  3:51 PM   Specimen: Nasopharyngeal Swab; Nasopharyngeal(NP) swabs in vial transport medium  Result Value Ref Range Status   SARS Coronavirus 2 by RT PCR NEGATIVE NEGATIVE Final    Comment: (NOTE) SARS-CoV-2 target nucleic acids are NOT DETECTED.  The  SARS-CoV-2 RNA is generally detectable in upper respiratory specimens during the acute phase of infection. The lowest concentration of SARS-CoV-2 viral copies this assay can detect is 138 copies/mL. A negative result does not preclude SARS-Cov-2 infection and should not be used as the sole basis for treatment or other patient management decisions. A negative result may occur with  improper specimen collection/handling, submission of specimen other than nasopharyngeal swab, presence of viral mutation(s) within the areas targeted by this assay, and inadequate number of viral copies(<138 copies/mL). A negative result must be combined with clinical observations, patient history, and epidemiological information. The expected result is Negative.  Fact Sheet for Patients:  EntrepreneurPulse.com.au  Fact Sheet for Healthcare Providers:  IncredibleEmployment.be  This test is no t yet approved or cleared by the [place] FDA and  has been authorized for detection and/or diagnosis of SARS-CoV-2 by FDA under an Emergency Use Authorization (EUA). This EUA will remain  in effect (meaning this test can be used) for the duration of the COVID-19 declaration under Section 564(b)(1) of the  Act, 21 U.S.C.section 360bbb-3(b)(1), unless the authorization is terminated  or revoked sooner.       Influenza A by PCR NEGATIVE NEGATIVE Final   Influenza B by PCR NEGATIVE NEGATIVE Final    Comment: (NOTE) The Xpert Xpress SARS-CoV-2/FLU/RSV plus assay is intended as an aid in the diagnosis of influenza from Nasopharyngeal swab specimens and should not be used as a sole basis for treatment. Nasal washings and aspirates are unacceptable for Xpert Xpress SARS-CoV-2/FLU/RSV testing.  Fact Sheet for Patients: EntrepreneurPulse.com.au  Fact Sheet for Healthcare Providers: IncredibleEmployment.be  This test is not yet approved or  cleared by the [place] FDA and has been authorized for detection and/or diagnosis of SARS-CoV-2 by FDA under an Emergency Use Authorization (EUA). This EUA will remain in effect (meaning this test can be used) for the duration of the COVID-19 declaration under Section 564(b)(1) of the Act, 21 U.S.C. section 360bbb-3(b)(1), unless the authorization is terminated or revoked.  Performed at [facility], [address] [place], [place] [address]   Urine Culture     Status: Abnormal   Collection Time: 02/02/21  5:13 PM   Specimen: Urine, Random  Result Value Ref Range Status   Specimen Description   Final    URINE, RANDOM Performed at [facility], [address] [place], [place] [address]    Special Requests   Final    NONE Performed at [facility], [address] [place], [place] [address]    Culture >=100,000 COLONIES/mL ESCHERICHIA COLI (A)  Final   Report Status 02/04/2021 FINAL  Final   Organism ID, Bacteria ESCHERICHIA COLI (A)  Final      Susceptibility   Escherichia coli - MIC*    AMPICILLIN <=2 SENSITIVE Sensitive     CEFAZOLIN <=4 SENSITIVE Sensitive     CEFEPIME <=0.12 SENSITIVE Sensitive     CEFTRIAXONE <=0.25 SENSITIVE Sensitive     CIPROFLOXACIN >=4 RESISTANT Resistant     GENTAMICIN <=1 SENSITIVE Sensitive     IMIPENEM <=0.25 SENSITIVE Sensitive     NITROFURANTOIN <=16 SENSITIVE Sensitive     TRIMETH/SULFA >=320 RESISTANT Resistant     AMPICILLIN/SULBACTAM <=2 SENSITIVE Sensitive     PIP/TAZO <=4 SENSITIVE Sensitive     * >=100,000 COLONIES/mL ESCHERICHIA COLI         Radiology Studies: DG Chest 2 View  Result Date: 02/02/2021 CLINICAL DATA:  AMS, ?SOB EXAM: CHEST - 2 VIEW COMPARISON:  10/19/2020. FINDINGS: Chronic increased lung markings. Mild left basilar opacities. No visible pleural effusions or pneumothorax. Cardiomediastinal silhouette is similar. Calcific atherosclerosis of the aorta. Vertebral augmentation at  multiple thoracic levels. Polyarticular degenerative change. IMPRESSION: Mild left basilar opacities, likely atelectasis. Electronically Signed   By: Margaretha Sheffield M.D.   On: 02/02/2021 15:55   CT HEAD WO CONTRAST (5MM)  Result Date: 02/02/2021 CLINICAL DATA:  Mental status change, unknown cause EXAM: CT HEAD WITHOUT CONTRAST TECHNIQUE: Contiguous axial images were obtained from the base of the skull through the vertex without intravenous contrast. COMPARISON:  04/28/2020 FINDINGS: Brain: There is no acute intracranial hemorrhage, mass effect, or edema. Gray-white differentiation is preserved. There is no extra-axial fluid collection. Stable prominence of ventricles and sulci reflecting parenchymal volume loss. Patchy and confluent hypoattenuation in the supratentorial white matter probably reflects stable chronic microvascular ischemic changes. Vascular: There is atherosclerotic calcification at the skull base. Skull: Calvarium is unremarkable. Sinuses/Orbits: Mild mucosal thickening. No acute orbital abnormality. Other: None. IMPRESSION: No acute intracranial abnormality. Stable chronic  findings detailed above. Electronically Signed   By: Macy Mis M.D.   On: 02/02/2021 15:36        Scheduled Meds:  apixaban  2.5 mg Oral BID   atorvastatin  80 mg Oral Daily   isosorbide mononitrate  15 mg Oral Daily   metoprolol tartrate  50 mg Oral BID   sodium chloride flush  3 mL Intravenous Q12H   Continuous Infusions:  sodium chloride     cefTRIAXone (ROCEPHIN)  IV Stopped (02/03/21 1857)     LOS: 2 days    Time spent: 30 minutes    Barb Merino, MD [facility] Pager [phone]

## 2021-02-04 NOTE — TOC CM/SW Note (Signed)
Patient was active with [facility] for RN prior to admission. They are able to add PT and OT.  Charlynn Court, CSW [phone]

## 2021-02-05 ENCOUNTER — Inpatient Hospital Stay: Payer: Medicare Other

## 2021-02-05 DIAGNOSIS — N3000 Acute cystitis without hematuria: Secondary | ICD-10-CM | POA: Diagnosis present

## 2021-02-05 DIAGNOSIS — F0390 Unspecified dementia without behavioral disturbance: Secondary | ICD-10-CM | POA: Diagnosis present

## 2021-02-05 DIAGNOSIS — I4811 Longstanding persistent atrial fibrillation: Secondary | ICD-10-CM

## 2021-02-05 DIAGNOSIS — I10 Essential (primary) hypertension: Secondary | ICD-10-CM

## 2021-02-05 DIAGNOSIS — E871 Hypo-osmolality and hyponatremia: Secondary | ICD-10-CM

## 2021-02-05 LAB — BASIC METABOLIC PANEL
Anion gap: 6 (ref 5–15)
BUN: 36 mg/dL — ABNORMAL HIGH (ref 8–23)
CO2: 26 mmol/L (ref 22–32)
Calcium: 7.7 mg/dL — ABNORMAL LOW (ref 8.9–10.3)
Chloride: 94 mmol/L — ABNORMAL LOW (ref 98–111)
Creatinine, Ser: 1.2 mg/dL — ABNORMAL HIGH (ref 0.44–1.00)
GFR, Estimated: 40 mL/min — ABNORMAL LOW (ref >60)
Glucose, Bld: 103 mg/dL — ABNORMAL HIGH (ref 70–99)
Potassium: 3.7 mmol/L (ref 3.5–5.1)
Sodium: 126 mmol/L — ABNORMAL LOW (ref 135–145)

## 2021-02-05 MED ORDER — SODIUM CHLORIDE 0.9 % IV SOLN: INTRAVENOUS | Status: DC

## 2021-02-05 MED ORDER — FUROSEMIDE 20 MG PO TABS: 20.0000 mg | ORAL_TABLET | Freq: Every day | ORAL | Status: DC

## 2021-02-05 MED ORDER — CEPHALEXIN 500 MG PO CAPS
500.0000 mg | ORAL_CAPSULE | Freq: Two times a day (BID) | ORAL | Status: DC
  Administered 2021-02-05 – 2021-02-08 (×6): 500 mg via ORAL
  Filled 2021-02-05 (×6): qty 1

## 2021-02-05 NOTE — TOC Progression Note (Signed)
Transition of Care [facility]) - Progression Note    Patient Details  Name: Rebekah Bridges MRN: [id number] Date of Birth: 1915-08-07  [facility] CM/SW Contact  Bing Quarry, RN Phone Number: 02/05/2021, 1:29 PM  Clinical Narrative: Nephrology and Cardiology consults today. Heart rate was 79 this am, watching to rate to stabilize for discharge per provider notes.   Getting some gentle hydration today per nephrology provider notes. Anticipate discharge to [facility] Monday/Tuesday. Being followed by [facility] PTA and per CM note [facility] can add PT/OT. Room air during the day being tolerated per flowcharts. Gabriel Cirri RN CM          Expected Discharge Plan and Services                                                 Social Determinants of Health (SDOH) Interventions    Readmission Risk Interventions Readmission Risk Prevention Plan 06/30/2020  Post Dischage Appt Complete  Medication Screening Complete  Transportation Screening Complete  Some recent data might be hidden

## 2021-02-05 NOTE — Consult Note (Signed)
[facility]  CONSULT NOTE    Date: 02/05/2021                  Patient Name:  Rebekah Bridges  MRN: [id number]  DOB: 27-Oct-1915  Age / Sex: 87 y.o., female         PCP: System, Provider Not In                 Service Requesting Consult: [place]                 Reason for Consult: Hyponatremia            History of Present Illness: Rebekah Bridges is a 87 y.o.  female with past medical history of CHF, hypertension, paroxysmal A. fib, hypertension, who was admitted to [facility] on 02/02/2021 for CHF exacerbation ([place]) [I50.9] Acute cystitis without hematuria [N30.00] Troponin I above reference range [R77.8] Longstanding persistent atrial fibrillation ([place]) [I48.11] Acute on chronic congestive heart failure, unspecified heart failure type ([place]) [I50.9] Dementia, unspecified dementia severity, unspecified dementia type, unspecified whether behavioral, psychotic, or mood disturbance or anxiety ([place]) [F03.90]   Patient presented to the emergency department from her assisted living facility with multiple complaints.  According to patient she states she has been having burning urination.  When speaking with daughter, she states she has had generalized weakness for the past few days and has fallen without injury.  Patient able to provide little input for hospital stay.  Chart review reveals she denies shortness of breath and fever on admission.  Patient currently sitting up in bed, nursing assisting with graham crackers and apple sauce.  Patient currently denies nausea.  Denies shortness of breath.  Denies pain and discomfort.  Sodium 127 on admission and has not been above 129 during this admission.  Other pertinent labs include creatinine 0.86 with GFR 59 on admission.  Elevated troponin and BNP.  CT head negative for acute changes.  Chest x-ray shows mild left basilar opacities.  UA positive for hematuria and mild proteinuria.  Medications: Outpatient  medications: Medications Prior to Admission  Medication Sig Dispense Refill Last Dose   aspirin 81 MG EC tablet Take 1 tablet (81 mg total) by mouth daily. Swallow whole. (Patient taking differently: Take 81 mg by mouth every other day. Swallow whole.) 30 tablet 11 02/01/2021 at 0900   beta carotene 25000 UNIT capsule Take 25,000 Units by mouth at bedtime.   02/01/2021 at 2000   cholecalciferol (VITAMIN D) 25 MCG (1000 UNIT) tablet Take 1,000 Units by mouth at bedtime.   02/01/2021 at 2000   furosemide (LASIX) 20 MG tablet Take 20 mg by mouth daily.   02/02/2021 at 0900   isosorbide mononitrate (IMDUR) 30 MG 24 hr tablet Take 0.5 tablets (15 mg total) by mouth daily. 45 tablet 0 02/02/2021 at 0900   metoprolol tartrate (LOPRESSOR) 25 MG tablet Take 1 tablet (25 mg total) by mouth 2 (two) times daily. 60 tablet 2 02/02/2021 at 0900   Multiple Vitamin (MULTIVITAMIN WITH MINERALS) TABS tablet Take 1 tablet by mouth at bedtime. One-A-Day Multivitamin   02/01/2021 at 2000   acetaminophen (TYLENOL) 325 MG tablet Take 650 mg by mouth every 6 (six) hours as needed.   prn at prn   atorvastatin (LIPITOR) 80 MG tablet Take 1 tablet (80 mg total) by mouth daily. 90 tablet 0    cadexomer iodine (IODOSORB) 0.9 % gel Apply 1 application topically daily as needed for wound care. (  Patient not taking: Reported on 02/02/2021) 40 g 0 Not Taking   calcium carbonate (TUMS - DOSED IN MG ELEMENTAL CALCIUM) 500 MG chewable tablet Chew 1 tablet by mouth 3 (three) times daily as needed for indigestion or heartburn.   prn at prn   ibuprofen (ADVIL) 200 MG tablet Take 200 mg by mouth every 8 (eight) hours as needed (back/knee pain).   prn at prn   RED YEAST RICE EXTRACT PO Take 1,200 mg by mouth at bedtime. (Patient not taking: Reported on 02/02/2021)   Not Taking    Current medications: Current Facility-Administered Medications  Medication Dose Route Frequency Provider Last Rate Last Admin   0.9 %  sodium chloride infusion   250 mL Intravenous PRN Cipriano BunkerKumar, Pardeep, MD       0.9 %  sodium chloride infusion   Intravenous Continuous Wendee BeaversBreeze, , NP 50 mL/hr at 02/05/21 1050 New Bag at 02/05/21 1050   acetaminophen (TYLENOL) tablet 650 mg  650 mg Oral Q6H PRN Cipriano BunkerKumar, Pardeep, MD   650 mg at 02/05/21 0030   acetaminophen (TYLENOL) tablet 650 mg  650 mg Oral Q4H PRN Cipriano BunkerKumar, Pardeep, MD   650 mg at 02/02/21 2303   apixaban (ELIQUIS) tablet 2.5 mg  2.5 mg Oral BID Lamar BlinksKowalski, Bruce J, MD   2.5 mg at 02/05/21 [id number]   atorvastatin (LIPITOR) tablet 80 mg  80 mg Oral Daily Cipriano BunkerKumar, Pardeep, MD   80 mg at 02/04/21 2219   cefTRIAXone (ROCEPHIN) 1 g in sodium chloride 0.9 % 100 mL IVPB  1 g Intravenous Q24H Cipriano BunkerKumar, Pardeep, MD 200 mL/hr at 02/04/21 1722 1 g at 02/04/21 1722   isosorbide mononitrate (IMDUR) 24 hr tablet 15 mg  15 mg Oral Daily Cipriano BunkerKumar, Pardeep, MD   15 mg at 02/05/21 0937   metoprolol tartrate (LOPRESSOR) tablet 50 mg  50 mg Oral BID Lamar BlinksKowalski, Bruce J, MD   50 mg at 02/05/21 0936   ondansetron [facility](ZOFRAN) injection 4 mg  4 mg Intravenous Q6H PRN Cipriano BunkerKumar, Pardeep, MD   4 mg at 02/02/21 2303   sodium chloride flush (NS) 0.9 % injection 3 mL  3 mL Intravenous Q12H Cipriano BunkerKumar, Pardeep, MD   3 mL at 02/05/21 0938   sodium chloride flush (NS) 0.9 % injection 3 mL  3 mL Intravenous PRN Cipriano BunkerKumar, Pardeep, MD          Allergies: Allergies  Allergen Reactions   Hydrocodone Nausea Only   Meloxicam Nausea Only    Upset stomach   Other Other (See Comments)   Tramadol Hcl Nausea Only and Other (See Comments)    sweating   Penicillins Nausea Only and Rash   Sulfa Antibiotics Nausea Only, Hives and Rash    And rash      Past Medical History: Past Medical History:  Diagnosis Date   Osteoporosis    s/p vertebroplasty for fracutres x 3   PAF (paroxysmal atrial fibrillation) (HCC) 07/17/2019     Past Surgical History: Past Surgical History:  Procedure Laterality Date   ABDOMINAL AORTOGRAM W/LOWER EXTREMITY N/A 05/26/2020    Procedure: ABDOMINAL AORTOGRAM W/LOWER EXTREMITY;  Surgeon: Cephus Shellinglark, Christopher J, MD;  Location: [facility] INVASIVE CV LAB;  Service: Cardiovascular;  Laterality: N/A;   BLADDER AUGMENTATION  1964   FEMORAL-POPLITEAL BYPASS GRAFT Right 06/27/2020   Procedure: RIGHT COOMMON FEMORAL TO ABOVE KNEE POPLITEAL ARTERY BYPASS GRAFT USING RIGHT GREATER SAPHENOUS VEIN;  Surgeon: Cephus Shellinglark, Christopher J, MD;  Location: [facility] OR;  Service: Vascular;  Laterality: Right;  SPINE SURGERY  2010   SPINE SURGERY  2011   SPINE SURGERY  2012   WOUND DEBRIDEMENT Right 06/27/2020   Procedure: RIGHT FOOT PARTIAL RESECTION FIRST METATARSAL/ PHALANGEAL JOINT, IMPLANT NON-BIOLOGICAL DRUG IMPLANT;  Surgeon: Edwin Cap, DPM;  Location: [facility] OR;  Service: Podiatry;  Laterality: Right;     Family History: Family History  Problem Relation Age of Onset   Cancer Father    Learning disabilities Paternal Grandmother    Cancer Sister    Cancer Brother      Social History: Social History   Socioeconomic History   Marital status: Widowed    Spouse name: Not on file   Number of children: Not on file   Years of education: Not on file   Highest education level: Not on file  Occupational History   Not on file  Tobacco Use   Smoking status: Never   Smokeless tobacco: Never  Vaping Use   Vaping Use: Never used  Substance and Sexual Activity   Alcohol use: Not Currently   Drug use: No   Sexual activity: Not on file  Other Topics Concern   Not on file  Social History Narrative   Not on file   Social Determinants of Health   Financial Resource Strain: Not on file  Food Insecurity: Not on file  Transportation Needs: Not on file  Physical Activity: Not on file  Stress: Not on file  Social Connections: Not on file  Intimate Partner Violence: Not on file     Review of Systems: Review of Systems  Constitutional:  Positive for malaise/fatigue. Negative for chills and fever.  HENT:  Negative for congestion, sore throat  and tinnitus.   Eyes:  Negative for blurred vision and redness.  Respiratory:  Negative for cough, shortness of breath and wheezing.   Cardiovascular:  Negative for chest pain, palpitations, claudication and leg swelling.  Gastrointestinal:  Negative for abdominal pain, blood in stool, diarrhea, nausea and vomiting.  Genitourinary:  Positive for dysuria. Negative for flank pain, frequency and hematuria.  Musculoskeletal:  Negative for back pain, falls and myalgias.  Skin:  Negative for rash.  Neurological:  Negative for dizziness, weakness and headaches.  Endo/Heme/Allergies:  Does not bruise/bleed easily.  Psychiatric/Behavioral:  Negative for depression. The patient is not nervous/anxious and does not have insomnia.    Vital Signs: Blood pressure 126/80, pulse 79, temperature 98 F (36.7 C), resp. rate 18, height 4\' 11"  (1.499 m), weight 55 kg, SpO2 98 %.  Weight trends: Filed Weights   02/02/21 1433 02/04/21 0500 02/05/21 0435  Weight: 57.2 kg 53.1 kg 55 kg    Physical Exam: General: NAD, resting in bed  Head: Normocephalic, atraumatic. Moist oral mucosal membranes  Eyes: Anicteric  Lungs:  Clear to auscultation, normal effort  Heart: Regular rate and rhythm  Abdomen:  Soft, nontender, nondistended  Extremities:  no peripheral edema.  Neurologic: Alert, disoriented, moving all four extremities  Skin: No lesions        Lab results: Basic Metabolic Panel: Recent Labs  Lab 02/03/21 0636 02/04/21 0435 02/05/21 0534  NA 129* 128* 126*  K 3.6 3.8 3.7  CL 94* 92* 94*  CO2 26 27 26   GLUCOSE 112* 96 103*  BUN 34* 37* 36*  CREATININE 1.26* 1.10* 1.20*  CALCIUM 8.2* 7.9* 7.7*  MG 2.0  --   --   PHOS 3.6  --   --     Liver Function Tests: Recent Labs  Lab 02/02/21  1551  AST 50*  ALT 15  ALKPHOS 64  BILITOT 1.1  PROT 7.5  ALBUMIN 3.6   No results for input(s): LIPASE, AMYLASE in the last 168 hours. Recent Labs  Lab 02/02/21 1551  AMMONIA <10     CBC: Recent Labs  Lab 02/02/21 1551  WBC 9.3  NEUTROABS 8.2*  HGB 11.3*  HCT 35.0*  MCV 93.6  PLT 126*    Cardiac Enzymes: No results for input(s): CKTOTAL, CKMB, CKMBINDEX, TROPONINI in the last 168 hours.  BNP: Invalid input(s): POCBNP  CBG: No results for input(s): GLUCAP in the last 168 hours.  Microbiology: Results for orders placed or performed during the hospital encounter of 02/02/21  Resp Panel by RT-PCR (Flu A&B, Covid) Nasopharyngeal Swab     Status: None   Collection Time: 02/02/21  3:51 PM   Specimen: Nasopharyngeal Swab; Nasopharyngeal(NP) swabs in vial transport medium  Result Value Ref Range Status   SARS Coronavirus 2 by RT PCR NEGATIVE NEGATIVE Final    Comment: (NOTE) SARS-CoV-2 target nucleic acids are NOT DETECTED.  The SARS-CoV-2 RNA is generally detectable in upper respiratory specimens during the acute phase of infection. The lowest concentration of SARS-CoV-2 viral copies this assay can detect is 138 copies/mL. A negative result does not preclude SARS-Cov-2 infection and should not be used as the sole basis for treatment or other patient management decisions. A negative result may occur with  improper specimen collection/handling, submission of specimen other than nasopharyngeal swab, presence of viral mutation(s) within the areas targeted by this assay, and inadequate number of viral copies(<138 copies/mL). A negative result must be combined with clinical observations, patient history, and epidemiological information. The expected result is Negative.  Fact Sheet for Patients:  BloggerCourse.com  Fact Sheet for Healthcare Providers:  SeriousBroker.it  This test is no t yet approved or cleared by the [place] FDA and  has been authorized for detection and/or diagnosis of SARS-CoV-2 by FDA under an Emergency Use Authorization (EUA). This EUA will remain  in effect (meaning this test  can be used) for the duration of the COVID-19 declaration under Section 564(b)(1) of the Act, 21 U.S.C.section 360bbb-3(b)(1), unless the authorization is terminated  or revoked sooner.       Influenza A by PCR NEGATIVE NEGATIVE Final   Influenza B by PCR NEGATIVE NEGATIVE Final    Comment: (NOTE) The Xpert Xpress SARS-CoV-2/FLU/RSV plus assay is intended as an aid in the diagnosis of influenza from Nasopharyngeal swab specimens and should not be used as a sole basis for treatment. Nasal washings and aspirates are unacceptable for Xpert Xpress SARS-CoV-2/FLU/RSV testing.  Fact Sheet for Patients: BloggerCourse.com  Fact Sheet for Healthcare Providers: SeriousBroker.it  This test is not yet approved or cleared by the [place] FDA and has been authorized for detection and/or diagnosis of SARS-CoV-2 by FDA under an Emergency Use Authorization (EUA). This EUA will remain in effect (meaning this test can be used) for the duration of the COVID-19 declaration under Section 564(b)(1) of the Act, 21 U.S.C. section 360bbb-3(b)(1), unless the authorization is terminated or revoked.  Performed at [facility], [address] [place], [place] [address]   Urine Culture     Status: Abnormal   Collection Time: 02/02/21  5:13 PM   Specimen: Urine, Random  Result Value Ref Range Status   Specimen Description   Final    URINE, RANDOM Performed at [facility], [address], [place], [place] [address]    Special  Requests   Final    NONE Performed at [facility], [address] [place], [place] [address]    Culture >=100,000 COLONIES/mL ESCHERICHIA COLI (A)  Final   Report Status 02/04/2021 FINAL  Final   Organism ID, Bacteria ESCHERICHIA COLI (A)  Final      Susceptibility   Escherichia coli - MIC*    AMPICILLIN <=2 SENSITIVE Sensitive     CEFAZOLIN <=4 SENSITIVE Sensitive     CEFEPIME <=0.12  SENSITIVE Sensitive     CEFTRIAXONE <=0.25 SENSITIVE Sensitive     CIPROFLOXACIN >=4 RESISTANT Resistant     GENTAMICIN <=1 SENSITIVE Sensitive     IMIPENEM <=0.25 SENSITIVE Sensitive     NITROFURANTOIN <=16 SENSITIVE Sensitive     TRIMETH/SULFA >=320 RESISTANT Resistant     AMPICILLIN/SULBACTAM <=2 SENSITIVE Sensitive     PIP/TAZO <=4 SENSITIVE Sensitive     * >=100,000 COLONIES/mL ESCHERICHIA COLI    Coagulation Studies: No results for input(s): LABPROT, INR in the last 72 hours.  Urinalysis: Recent Labs    02/02/21 1710  COLORURINE YELLOW  LABSPEC 1.020  PHURINE 5.5  GLUCOSEU NEGATIVE  HGBUR LARGE*  BILIRUBINUR NEGATIVE  KETONESUR NEGATIVE  PROTEINUR 100*  NITRITE POSITIVE*  LEUKOCYTESUR LARGE*      Imaging: No results found.   Assessment & Plan: Rebekah Bridges is a 87 y.o.  female with past medical history of CHF, hypertension, paroxysmal A. fib, hypertension, who was admitted to [facility] on 02/02/2021 for CHF exacerbation ([place]) [I50.9] Acute cystitis without hematuria [N30.00] Troponin I above reference range [R77.8] Longstanding persistent atrial fibrillation ([place]) [I48.11] Acute on chronic congestive heart failure, unspecified heart failure type ([place]) [I50.9] Dementia, unspecified dementia severity, unspecified dementia type, unspecified whether behavioral, psychotic, or mood disturbance or anxiety ([place]) [F03.90]  Hyponatremia. Appears to have been normal in September. Currently receiving Furosemide. Will stop diuretic. Elevated creatinine and Bun, indicates possible over diuresis. Will order gentle hydration NS 7ml/hr. Will obtain CT chest w/o contrast to evaluate for lung mass. Will monitor sodium level.    LOS: 3   12/18/202211:38 AM

## 2021-02-05 NOTE — Progress Notes (Signed)
[facility] Cardiology [facility] Encounter Note  Patient: Rebekah Bridges / Admit Date: 02/02/2021 / Date of Encounter: 02/05/2021, 8:47 AM   Subjective: Patient overall continuing to improve with less shortness of breath cough and congestion.  Heart rate control is better today with increase of beta-blocker from yesterday.  Atrial fibrillation remains.  Patient was placed on Eliquis without evidence of bleeding complications at this time.  Review of Systems: Positive for: Shortness of breath Negative for: Vision change, hearing change, syncope, dizziness, nausea, vomiting,diarrhea, bloody stool, stomach pain, cough, congestion, diaphoresis, urinary frequency, urinary pain,skin lesions, skin rashes Others previously listed  Objective: Telemetry: Atrial fibrillation with controlled ventricular rate Physical Exam: Blood pressure 118/63, pulse 90, temperature 98 F (36.7 C), resp. rate 18, height 4\' 11"  (1.499 m), weight 55 kg, SpO2 93 %. Body mass index is 24.49 kg/m. General: Well developed, well nourished, in no acute distress. Head: Normocephalic, atraumatic, sclera non-icteric, no xanthomas, nares are without discharge. Neck: No apparent masses Lungs: Normal respirations with few new wheezes, some rhonchi, no rales , no crackles   Heart: Irregular rate and rhythm, normal S1 S2, no murmur, no rub, no gallop, PMI is normal size and placement, carotid upstroke normal without bruit, jugular venous pressure normal Abdomen: Soft, non-tender, non-distended with normoactive bowel sounds. No hepatosplenomegaly. Abdominal aorta is normal size without bruit Extremities: No edema, no clubbing, no cyanosis, no ulcers,  Peripheral: 2+ radial, 2+ femoral, 2+ dorsal pedal pulses Neuro: Alert and oriented. Moves all extremities spontaneously. Psych:  Responds to questions appropriately with a normal affect.   Intake/Output Summary (Last 24 hours) at 02/05/2021 0847 Last data filed at 02/05/2021  0435 Gross per 24 hour  Intake 583 ml  Output 502 ml  Net 81 ml     Inpatient Medications:   apixaban  2.5 mg Oral BID   atorvastatin  80 mg Oral Daily   isosorbide mononitrate  15 mg Oral Daily   metoprolol tartrate  50 mg Oral BID   sodium chloride flush  3 mL Intravenous Q12H   Infusions:   sodium chloride     cefTRIAXone (ROCEPHIN)  IV 1 g (02/04/21 1722)    Labs: Recent Labs    02/03/21 0636 02/04/21 0435 02/05/21 0534  NA 129* 128* 126*  K 3.6 3.8 3.7  CL 94* 92* 94*  CO2 26 27 26   GLUCOSE 112* 96 103*  BUN 34* 37* 36*  CREATININE 1.26* 1.10* 1.20*  CALCIUM 8.2* 7.9* 7.7*  MG 2.0  --   --   PHOS 3.6  --   --     Recent Labs    02/02/21 1551  AST 50*  ALT 15  ALKPHOS 64  BILITOT 1.1  PROT 7.5  ALBUMIN 3.6    Recent Labs    02/02/21 1551  WBC 9.3  NEUTROABS 8.2*  HGB 11.3*  HCT 35.0*  MCV 93.6  PLT 126*    No results for input(s): CKTOTAL, CKMB, TROPONINI in the last 72 hours. Invalid input(s): POCBNP No results for input(s): HGBA1C in the last 72 hours.   Weights: Filed Weights   02/02/21 1433 02/04/21 0500 02/05/21 0435  Weight: 57.2 kg 53.1 kg 55 kg     Radiology/Studies:  DG Chest 2 View  Result Date: 02/02/2021 CLINICAL DATA:  AMS, ?SOB EXAM: CHEST - 2 VIEW COMPARISON:  10/19/2020. FINDINGS: Chronic increased lung markings. Mild left basilar opacities. No visible pleural effusions or pneumothorax. Cardiomediastinal silhouette is similar. Calcific atherosclerosis of the aorta.  Vertebral augmentation at multiple thoracic levels. Polyarticular degenerative change. IMPRESSION: Mild left basilar opacities, likely atelectasis. Electronically Signed   By: Feliberto Harts M.D.   On: 02/02/2021 15:55   CT HEAD WO CONTRAST ( )  Result Date: 02/02/2021 CLINICAL DATA:  Mental status change, unknown cause EXAM: CT HEAD WITHOUT CONTRAST TECHNIQUE: Contiguous axial images were obtained from the base of the skull through the vertex without  intravenous contrast. COMPARISON:  04/28/2020 FINDINGS: Brain: There is no acute intracranial hemorrhage, mass effect, or edema. Gray-white differentiation is preserved. There is no extra-axial fluid collection. Stable prominence of ventricles and sulci reflecting parenchymal volume loss. Patchy and confluent hypoattenuation in the supratentorial white matter probably reflects stable chronic microvascular ischemic changes. Vascular: There is atherosclerotic calcification at the skull base. Skull: Calvarium is unremarkable. Sinuses/Orbits: Mild mucosal thickening. No acute orbital abnormality. Other: None. IMPRESSION: No acute intracranial abnormality. Stable chronic findings detailed above. Electronically Signed   By: Guadlupe Spanish M.D.   On: 02/02/2021 15:36     Assessment and Recommendation  87 y.o. female with chronic kidney disease stage III with acute infection elevated troponin possibly consistent with demand ischemia and atrial fibrillation with rapid ventricular rate now better controlled 1.  Continue metoprolol for better heart rate control with goal heart rate between 60 and 90 bpm 2.  Echocardiogram for LV systolic dysfunction valvular heart disease 3.  Patient may benefit from anticoagulation for further risk reduction and stroke with atrial fibrillation and Eliquis 2.5 mg twice per day and was started on without evidence of significant bleeding complications overnight 4.  Continue supportive care of infection exacerbating above 5.  Begin ambulation and follow-up for improvements of symptoms and possible discharge to home and/or other facility if heart rate is controlled with follow-up later than the week for further adjustments of medication management  Signed, Arnoldo Hooker M.D. FACC

## 2021-02-05 NOTE — Progress Notes (Signed)
[facility]  - [place] at [facility]   PATIENT NAME: Rebekah Bridges    MR#:  [id number]  DATE OF BIRTH:  03/26/1915  SUBJECTIVE:   no family at bedside. Patient resting comfortably. Denies chest pain or shortness of breath. Not able to get much history from her. She is breathing comfortably REVIEW OF SYSTEMS:   Review of Systems  Constitutional:  Negative for chills, fever and weight loss.  HENT:  Negative for ear discharge, ear pain and nosebleeds.   Eyes:  Negative for blurred vision, pain and discharge.  Respiratory:  Negative for sputum production, shortness of breath, wheezing and stridor.   Cardiovascular:  Negative for chest pain, palpitations, orthopnea and PND.  Gastrointestinal:  Negative for abdominal pain, diarrhea, nausea and vomiting.  Genitourinary:  Negative for frequency and urgency.  Musculoskeletal:  Negative for back pain and joint pain.  Neurological:  Negative for sensory change, speech change, focal weakness and weakness.  Psychiatric/Behavioral:  Negative for depression and hallucinations. The patient is not nervous/anxious.   Tolerating Diet:yes Tolerating PT: HHPt  DRUG ALLERGIES:   Allergies  Allergen Reactions   Hydrocodone Nausea Only   Meloxicam Nausea Only    Upset stomach   Other Other (See Comments)   Tramadol Hcl Nausea Only and Other (See Comments)    sweating   Penicillins Nausea Only and Rash   Sulfa Antibiotics Nausea Only, Hives and Rash    And rash    VITALS:  Blood pressure 126/80, pulse 79, temperature 98 F (36.7 C), resp. rate 18, height 4\' 11"  (1.499 m), weight 55 kg, SpO2 98 %.  PHYSICAL EXAMINATION:   Physical Exam  GENERAL:  87 y.o.-year-old patient lying in the bed with no acute distress.  HEENT: Head atraumatic, normocephalic. Oropharynx and nasopharynx clear.  LUNGS: Normal breath sounds bilaterally, no wheezing, rales, rhonchi. No use of accessory muscles of respiration.  CARDIOVASCULAR: S1, S2 normal.  No murmurs, rubs, or gallops.  ABDOMEN: Soft, nontender, nondistended. Bowel sounds present. No organomegaly or mass.  EXTREMITIES: No cyanosis, clubbing or edema b/l.    NEUROLOGIC: nonfocal PSYCHIATRIC:  patient is alert and oriented x 3.  SKIN: skin dry  LABORATORY PANEL:  CBC Recent Labs  Lab 02/02/21 1551  WBC 9.3  HGB 11.3*  HCT 35.0*  PLT 126*    Chemistries  Recent Labs  Lab 02/02/21 1551 02/03/21 0636 02/04/21 0435 02/05/21 0534  NA 127* 129*   < > 126*  K 3.9 3.6   < > 3.7  CL 92* 94*   < > 94*  CO2 27 26   < > 26  GLUCOSE 116* 112*   < > 103*  BUN 32* 34*   < > 36*  CREATININE 0.86 1.26*   < > 1.20*  CALCIUM 8.5* 8.2*   < > 7.7*  MG  --  2.0  --   --   AST 50*  --   --   --   ALT 15  --   --   --   ALKPHOS 64  --   --   --   BILITOT 1.1  --   --   --    < > = values in this interval not displayed.   Cardiac Enzymes No results for input(s): TROPONINI in the last 168 hours. RADIOLOGY:  No results found. ASSESSMENT AND PLAN:  87 year old female with history of hypertension, chronic combined heart failure, osteoporosis, paroxysmal A. fib, chronic respiratory failure on 2 L  oxygen from assisted living facility presented to the ER with multiple complaints including burning urination, weakness and falling 2 days ago without any physical injury.  In the emergency room heart rate 110, afebrile.  100% on 2 L oxygen.  Sodium 127.  Urinalysis with likely infection.   Acute UTI present on admission: E. coli pansensitive.  Continue Rocephin due to ongoing temperature.  No evidence of urinary retention. -- No fever. Change to PO Keflex.   Hypochloremic hyponatremia: Likely related to low oral intake.  Also possible intravascular volume overload due to CHF. Sodium level slightly improving.  Received Lasix.  Supportive treatment. -- Sodium continues to decrease. Today 126. -- Nephrology consultation with Dr. Zollie Scale. Recommends fluid at low rate.  Paroxysmal A. fib:  Atrial fibrillation. Seen by cardiology.  Started on anticoagulation with Eliquis.   Suspected acute on chronic combined heart failure exacerbation: Chronic hypoxemic respiratory failure. --Mildly elevated troponins without chest pain.  EKG nonischemic. --Known ejection fraction 45 to 50% with mildly reduced ventricular ejection fraction. --Patient is fairly compensated and euvolemic. --Patient is already on heart failure therapy with Lasix 40 mg daily at home, metoprolol and nitrates.  --She uses 2 L oxygen at home and saturating 100%. --Mildly elevated troponins are likely secondary to demand ischemia.  No evidence of acute coronary syndrome.   --On aspirin, statin, beta-blockers and nitrates. -- Discontinue aspirin as starting on Eliquis.  Generalized weakness -- PT OT-- recommends home health  Family communication : Consults : cardiology, nephrology CODE STATUS: DNR DVT Prophylaxis : eliquis Level of care: Telemetry Cardiac Status is: Inpatient  Remains inpatient appropriate because: low sodium        TOTAL TIME TAKING CARE OF THIS PATIENT: 25 minutes.  >50% time spent on counselling and coordination of care  Note: This dictation was prepared with Dragon dictation along with smaller phrase technology. Any transcriptional errors that result from this process are unintentional.  Fritzi Mandes M.D    [facility]   CC: Primary care physician; System, Provider Not In Patient ID: Rebekah Bridges, female   DOB: Apr 02, 1915, 87 y.o.   MRN: [id number]

## 2021-02-06 ENCOUNTER — Inpatient Hospital Stay (HOSPITAL_COMMUNITY)
Admit: 2021-02-06 | Discharge: 2021-02-06 | Disposition: A | Discharge status: Home / Self Care | Payer: Medicare Other | Attending: Student | Admitting: Student

## 2021-02-06 DIAGNOSIS — I4891 Unspecified atrial fibrillation: Secondary | ICD-10-CM

## 2021-02-06 DIAGNOSIS — I5021 Acute systolic (congestive) heart failure: Secondary | ICD-10-CM

## 2021-02-06 LAB — BASIC METABOLIC PANEL
Anion gap: 6 (ref 5–15)
BUN: 28 mg/dL — ABNORMAL HIGH (ref 8–23)
CO2: 24 mmol/L (ref 22–32)
Calcium: 7.6 mg/dL — ABNORMAL LOW (ref 8.9–10.3)
Chloride: 97 mmol/L — ABNORMAL LOW (ref 98–111)
Creatinine, Ser: 0.81 mg/dL (ref 0.44–1.00)
GFR, Estimated: >60 mL/min (ref >60)
Glucose, Bld: 92 mg/dL (ref 70–99)
Potassium: 3.9 mmol/L (ref 3.5–5.1)
Sodium: 127 mmol/L — ABNORMAL LOW (ref 135–145)

## 2021-02-06 LAB — ECHOCARDIOGRAM COMPLETE
AR max vel: 2.75 cm2
AV Area VTI: 3.14 cm2
AV Area mean vel: 2.82 cm2
AV Mean grad: 2 mmHg
AV Peak grad: 4 mmHg
Ao pk vel: 1 m/s
Area-P 1/2: 5.5 cm2
Height: 59 in
MV VTI: 2.91 cm2
S' Lateral: 2.3 cm
Weight: 1992.96 oz

## 2021-02-06 NOTE — Progress Notes (Signed)
[facility]  ROUNDING NOTE   Subjective:   Patient seen sitting up in chair, alert and oriented Tolerating meals, states we send her too much to eat Denies shortness of breath, cramping  Sodium 127 IV fluids-normal saline at 50 ml/hr  Objective:  Vital signs in last 24 hours:  Temp:  [97.9 F (36.6 C)-98.9 F (37.2 C)] 98.3 F (36.8 C) (12/19 1109) Pulse Rate:  [76-93] 76 (12/19 1109) Resp:  [16-18] 18 (12/19 1109) BP: (98-159)/(69-96) 121/82 (12/19 1109) SpO2:  [90 %-100 %] 100 % (12/19 1109) Weight:  [56.5 kg] 56.5 kg (12/19 0604)  Weight change: 1.5 kg Filed Weights   02/04/21 0500 02/05/21 0435 02/06/21 0604  Weight: 53.1 kg 55 kg 56.5 kg    Intake/Output: I/O last 3 completed shifts: In: 1287.9 [P.O.:1080; I.V.:207.9] Out: 500 [Urine:500]   Intake/Output this shift:  No intake/output data recorded.  Physical Exam: General: NAD, sitting in chair  Head: Normocephalic, atraumatic. Moist oral mucosal membranes  Eyes: Anicteric  Lungs:  Clear to auscultation, normal effort  Heart: Regular rate and rhythm  Abdomen:  Soft, nontender  Extremities:  no peripheral edema.  Neurologic: Nonfocal, moving all four extremities  Skin: No lesions       Basic Metabolic Panel: Recent Labs  Lab 02/02/21 1551 02/03/21 0636 02/04/21 0435 02/05/21 0534 02/06/21 0613  NA 127* 129* 128* 126* 127*  K 3.9 3.6 3.8 3.7 3.9  CL 92* 94* 92* 94* 97*  CO2 27 26 27 26 24   GLUCOSE 116* 112* 96 103* 92  BUN 32* 34* 37* 36* 28*  CREATININE 0.86 1.26* 1.10* 1.20* 0.81  CALCIUM 8.5* 8.2* 7.9* 7.7* 7.6*  MG  --  2.0  --   --   --   PHOS  --  3.6  --   --   --     Liver Function Tests: Recent Labs  Lab 02/02/21 1551  AST 50*  ALT 15  ALKPHOS 64  BILITOT 1.1  PROT 7.5  ALBUMIN 3.6   No results for input(s): LIPASE, AMYLASE in the last 168 hours. Recent Labs  Lab 02/02/21 1551  AMMONIA <10    CBC: Recent Labs  Lab 02/02/21 1551  WBC 9.3  NEUTROABS  8.2*  HGB 11.3*  HCT 35.0*  MCV 93.6  PLT 126*    Cardiac Enzymes: No results for input(s): CKTOTAL, CKMB, CKMBINDEX, TROPONINI in the last 168 hours.  BNP: Invalid input(s): POCBNP  CBG: No results for input(s): GLUCAP in the last 168 hours.  Microbiology: Results for orders placed or performed during the hospital encounter of 02/02/21  Resp Panel by RT-PCR (Flu A&B, Covid) Nasopharyngeal Swab     Status: None   Collection Time: 02/02/21  3:51 PM   Specimen: Nasopharyngeal Swab; Nasopharyngeal(NP) swabs in vial transport medium  Result Value Ref Range Status   SARS Coronavirus 2 by RT PCR NEGATIVE NEGATIVE Final    Comment: (NOTE) SARS-CoV-2 target nucleic acids are NOT DETECTED.  The SARS-CoV-2 RNA is generally detectable in upper respiratory specimens during the acute phase of infection. The lowest concentration of SARS-CoV-2 viral copies this assay can detect is 138 copies/mL. A negative result does not preclude SARS-Cov-2 infection and should not be used as the sole basis for treatment or other patient management decisions. A negative result may occur with  improper specimen collection/handling, submission of specimen other than nasopharyngeal swab, presence of viral mutation(s) within the areas targeted by this assay, and inadequate number of viral copies(<138  copies/mL). A negative result must be combined with clinical observations, patient history, and epidemiological information. The expected result is Negative.  Fact Sheet for Patients:  EntrepreneurPulse.com.au  Fact Sheet for Healthcare Providers:  IncredibleEmployment.be  This test is no t yet approved or cleared by the [place] FDA and  has been authorized for detection and/or diagnosis of SARS-CoV-2 by FDA under an Emergency Use Authorization (EUA). This EUA will remain  in effect (meaning this test can be used) for the duration of the COVID-19 declaration under  Section 564(b)(1) of the Act, 21 U.S.C.section 360bbb-3(b)(1), unless the authorization is terminated  or revoked sooner.       Influenza A by PCR NEGATIVE NEGATIVE Final   Influenza B by PCR NEGATIVE NEGATIVE Final    Comment: (NOTE) The Xpert Xpress SARS-CoV-2/FLU/RSV plus assay is intended as an aid in the diagnosis of influenza from Nasopharyngeal swab specimens and should not be used as a sole basis for treatment. Nasal washings and aspirates are unacceptable for Xpert Xpress SARS-CoV-2/FLU/RSV testing.  Fact Sheet for Patients: EntrepreneurPulse.com.au  Fact Sheet for Healthcare Providers: IncredibleEmployment.be  This test is not yet approved or cleared by the [place] FDA and has been authorized for detection and/or diagnosis of SARS-CoV-2 by FDA under an Emergency Use Authorization (EUA). This EUA will remain in effect (meaning this test can be used) for the duration of the COVID-19 declaration under Section 564(b)(1) of the Act, 21 U.S.C. section 360bbb-3(b)(1), unless the authorization is terminated or revoked.  Performed at [facility], [address] [place], [place] [address]   Urine Culture     Status: Abnormal   Collection Time: 02/02/21  5:13 PM   Specimen: Urine, Random  Result Value Ref Range Status   Specimen Description   Final    URINE, RANDOM Performed at [facility], [address] [place], [place] [address]    Special Requests   Final    NONE Performed at [facility], [place], [place] [address]    Culture >=100,000 COLONIES/mL ESCHERICHIA COLI (A)  Final   Report Status 02/04/2021 FINAL  Final   Organism ID, Bacteria ESCHERICHIA COLI (A)  Final      Susceptibility   Escherichia coli - MIC*    AMPICILLIN <=2 SENSITIVE Sensitive     CEFAZOLIN <=4 SENSITIVE Sensitive     CEFEPIME <=0.12 SENSITIVE Sensitive     CEFTRIAXONE <=0.25 SENSITIVE Sensitive      CIPROFLOXACIN >=4 RESISTANT Resistant     GENTAMICIN <=1 SENSITIVE Sensitive     IMIPENEM <=0.25 SENSITIVE Sensitive     NITROFURANTOIN <=16 SENSITIVE Sensitive     TRIMETH/SULFA >=320 RESISTANT Resistant     AMPICILLIN/SULBACTAM <=2 SENSITIVE Sensitive     PIP/TAZO <=4 SENSITIVE Sensitive     * >=100,000 COLONIES/mL ESCHERICHIA COLI    Coagulation Studies: No results for input(s): LABPROT, INR in the last 72 hours.  Urinalysis: No results for input(s): COLORURINE, LABSPEC, PHURINE, GLUCOSEU, HGBUR, BILIRUBINUR, KETONESUR, PROTEINUR, UROBILINOGEN, NITRITE, LEUKOCYTESUR in the last 72 hours.  Invalid input(s): APPERANCEUR    Imaging: CT CHEST WO CONTRAST  Result Date: 02/05/2021 CLINICAL DATA:  Rule out lung mass. EXAM: CT CHEST WITHOUT CONTRAST TECHNIQUE: Multidetector CT imaging of the chest was performed following the standard protocol without IV contrast. COMPARISON:  Chest radiograph 02/02/2021 FINDINGS: Cardiovascular: No significant vascular findings. Scattered aortic calcifications. Multivessel coronary artery calcification. Normal heart size. No pericardial effusion. Mediastinum/Nodes: There are few calcified mediastinal lymph nodes.  No mediastinal, hilar, or axillary lymphadenopathy. Two hypodense subcentimeter nodules are noted in the mid right and lower left thyroid (series 2, image 9, 17). No abnormality of the trachea or esophagus. Lungs/Pleura: Trace bilateral pleural effusions with adjacent compressive atelectasis at the lung bases. Calcified granulomas in the left upper lung. No pneumothorax. Upper Abdomen: Scattered calcified granulomas in the liver and spleen. No acute abnormality of the visualized upper abdomen. Musculoskeletal: No chest wall mass or suspicious bone lesions identified. Vertebral body augmentation of the T11 and T12 vertebral bodies. Compression fracture of the superior endplate of the T4 vertebral body with less than 25% vertebral body height loss  IMPRESSION: 1. No pulmonary mass as queried. 2. Trace bilateral pleural effusions with adjacent compressive atelectasis in the lung bases. 3. Age-indeterminate compression fracture of the T4 vertebral body superior endplate with less than 25% vertebral body height loss. 4. Aortic Atherosclerosis (ICD10-I70.0). Multivessel coronary artery disease. 5. Findings of prior granulomatous disease in the thorax and upper abdomen. 6. Subcentimeter incidental thyroid nodules. Not clinically significant; no follow-up imaging recommended (ref: J Am Coll Radiol. 2015 Feb;12(2): 143-50). Electronically Signed   By: Sherron Ales M.D.   On: 02/05/2021 15:21     Medications:    sodium chloride     sodium chloride 50 mL/hr at 02/05/21 1050    apixaban  2.5 mg Oral BID   atorvastatin  80 mg Oral Daily   cephALEXin  500 mg Oral BID   isosorbide mononitrate  15 mg Oral Daily   metoprolol tartrate  50 mg Oral BID   sodium chloride flush  3 mL Intravenous Q12H   sodium chloride, acetaminophen, acetaminophen, ondansetron (ZOFRAN) IV, sodium chloride flush  Assessment/ Plan:  Ms. Rebekah Bridges is a 87 y.o.  female with past medical history of CHF, hypertension, paroxysmal A. fib, hypertension, who was admitted to [facility] on 02/02/2021 for CHF exacerbation (HCC) [I50.9] Acute cystitis without hematuria [N30.00] Troponin I above reference range [R77.8] Longstanding persistent atrial fibrillation (HCC) [I48.11] Acute on chronic congestive heart failure, unspecified heart failure type (HCC) [I50.9] Dementia, unspecified dementia severity, unspecified dementia type, unspecified whether behavioral, psychotic, or mood disturbance or anxiety (HCC) [F03.90]   Hyponatremia. Appears to have been normal in September. Continue to hold diuretic. CT chest negative for lung mass. Sodium 127 today. Continue IVF until tomorrow. Will continue to monitor.   2.  Acute Kidney Injury with baseline creatinine 0.86 and GFR of 59 on  02/02/21.  Acute kidney injury secondary to diuresis No acute indication for dialysis. No exposure to IV contrast. Creatinine has improved with gentle hydration. Continue to hold diuretics at this time.   Lab Results  Component Value Date   CREATININE 0.81 02/06/2021   CREATININE 1.20 (H) 02/05/2021   CREATININE 1.10 (H) 02/04/2021    Intake/Output Summary (Last 24 hours) at 02/06/2021 1406 Last data filed at 02/05/2021 1909 Gross per 24 hour  Intake 684.88 ml  Output --  Net 684.88 ml       LOS: 4   12/19/20222:06 PM

## 2021-02-06 NOTE — Progress Notes (Signed)
Occupational Therapy Treatment Patient Details Name: Rebekah Bridges MRN: [id number] DOB: [birth year] Today's Date: 02/06/2021   History of present illness Rebekah Bridges is a 87 y/o female w/ PMH: afib, osteoporosis, HTN, CHF and chronic hypoxic respiratory failure on 2Lnc PRN. She presented to ED via EMS from [facility] d/t concerns for low oxygen saturations; admitted for mangement of acute/chronic respiratory failure secondary to CHF exacerbation.   OT comments  Pt seen for OT treatment on this date. Upon arrival to room, pt awake and seated upright in bed on room air (SpO2 95%). Pt agreeable to OT tx. Pt currently requires MIN GUARD for bed mobility (with HOB elevated), MIN GUARD for sit<>stand transfers, MIN GUARD for functional mobility of short household distances with RW, and SET-UP assist for seated grooming tasks d/t decreased strength and activity tolerance. This author unable to obtain SpO2 reading during functional mobility, however SpO2 95% once seated in recliner at end of session. Pt is making good progress toward goals and continues to benefit from skilled OT services to maximize return to PLOF and minimize risk of future falls, injury, caregiver burden, and readmission. Will continue to follow POC. Discharge recommendation remains appropriate.     Recommendations for follow up therapy are one component of a multi-disciplinary discharge planning process, led by the attending physician.  Recommendations may be updated based on patient status, additional functional criteria and insurance authorization.    Follow Up Recommendations  Home health OT    Assistance Recommended at Discharge Intermittent Supervision/Assistance  Equipment Recommendations  BSC/3in1;Tub/shower seat       Precautions / Restrictions Precautions Precautions: Fall Restrictions Weight Bearing Restrictions: No       Mobility Bed Mobility Overal bed mobility: Needs Assistance Bed Mobility: Supine to Sit      Supine to sit: Min guard;HOB elevated        Transfers Overall transfer level: Needs assistance Equipment used: Rolling walker (2 wheels) Transfers: Sit to/from Stand Sit to Stand: Min guard           General transfer comment: cuing for hand placement, tends to pull on RW despite cues.     Balance Overall balance assessment: Needs assistance Sitting-balance support: No upper extremity supported;Feet supported Sitting balance-Leahy Scale: Good     Standing balance support: Bilateral upper extremity supported;During functional activity;Reliant on assistive device for balance Standing balance-Leahy Scale: Fair Standing balance comment: Requires MIN GUARD with use of RW                           ADL either performed or assessed with clinical judgement   ADL Overall ADL's : Needs assistance/impaired Eating/Feeding: Set up;Sitting   Grooming: Oral care;Wash/dry face;Set up;Sitting Grooming Details (indicate cue type and reason): Requires assist to remove toothbrush from packaging                             Functional mobility during ADLs: Min guard;Rolling walker (2 wheels)        Cognition Arousal/Alertness: Awake/alert Behavior During Therapy: WFL for tasks assessed/performed Overall Cognitive Status: Within Functional Limits for tasks assessed                                 General Comments: Alert and oriented to self and place. Pleasant and coorperative throughout  Exercises Other Exercises Other Exercises: safe use of DME, progressive mobility to maintain strength/independence with ADL completion      General Comments resting HR in 70's up to 85 BPM with ambulation    Pertinent Vitals/ Pain       Pain Assessment: No/denies pain         Frequency  Min 2X/week        Progress Toward Goals  OT Goals(current goals can now be found in the care plan section)  Progress towards OT goals: Progressing  toward goals  Acute Rehab OT Goals Patient Stated Goal: to regain strength OT Goal Formulation: With patient Time For Goal Achievement: 02/17/21 Potential to Achieve Goals: Good  Plan Discharge plan remains appropriate;Frequency remains appropriate       AM-PAC OT "6 Clicks" Daily Activity     Outcome Measure   Help from another person eating meals?: None Help from another person taking care of personal grooming?: A Little Help from another person toileting, which includes using toliet, bedpan, or urinal?: A Little Help from another person bathing (including washing, rinsing, drying)?: A Lot Help from another person to put on and taking off regular upper body clothing?: None Help from another person to put on and taking off regular lower body clothing?: A Lot 6 Click Score: 18    End of Session Equipment Utilized During Treatment: Gait belt;Rolling walker (2 wheels)  OT Visit Diagnosis: Muscle weakness (generalized) (M62.81)   Activity Tolerance Patient tolerated treatment well   Patient Left in chair;with call bell/phone within reach;with chair alarm set   Nurse Communication Mobility status        Time: 4193-7902 OT Time Calculation (min): 30 min  Charges: OT General Charges $OT Visit: 1 Visit OT Treatments $Self Care/Home Management : 8-22 mins $Therapeutic Activity: 8-22 mins  Matthew Folks, OTR/L [facility] [phone]

## 2021-02-06 NOTE — Consult Note (Signed)
° °  Heart Failure Nurse Navigator Note    HfmrEF 25 to 50% by echocardiogram performed in September 2022 normal right ventricular systolic function.  Echocardiogram is pending on this admission.  She presented to the emergency room with complaints of shortness of breath, generalized weakness and burning on urination.  BNP was 1379.  Comorbidities:  Hypertension Osteoporosis Paroxysmal atrial fibrillation  Chronic hypoxic respiratory failure on 2 L as needed  Medications:  Eliquis 2.5 mg 2 times a day Atorvastatin 80 mg daily Isosorbide mononitrate 15 mg daily Metoprolol tartrate 50 mg 2 times a day  Lasix 20 mg p.o. has been discontinued   Labs:  Sodium 127, potassium 3.9, chloride 97, CO2 24, BUN 28, creatinine 0.81 Weight is 56.5 kg Blood pressure 121/82 Intake 1284 mL Output not documented   Initial meeting with patient today, she is awake and alert sitting up in the chair at bedside.  She states that she lives in assisted living facility and that they prepare her meals and she does not add salt at the table, she goes on to state that she is not added salt for quite a few years.  She states that she has a scale but does not weigh herself on a daily basis.  Discussed the importance of weighing daily and recording and to report 2 to 3 pound weight gain overnight or 5 pounds within the week.  Discussed fluid restriction less than 64 ounces in a days time.  She states that she used to drink a lot of coffee throughout the day but did not do that anymore.  Several times during the time spent with her she would talk about how she feels that she is a burden on her family and that she has lived 89 years and had a good life and is just hoping that the man upstairs takes her soon.  Has an appointment in the outpatient heart failure clinic on January 16 at 10 AM.  She has a 6%  rating of no-show as 4 out of 66 appointments.  Tresa Endo RN [facility]

## 2021-02-06 NOTE — Progress Notes (Signed)
Physical Therapy Treatment Patient Details Name: Rebekah Bridges MRN: [id number] DOB: 10-Mar-1915 Today's Date: 02/06/2021   History of Present Illness Rebekah Bridges is a 87 y/o delightful female w/ PMH: afib, osteoporosis, HTN, CHF and chronic hypoxic respiratory failure on 2Lnc PRN. She presented to ED via EMS from [facility] d/t concerns for low oxygen saturations; admitted for mangement of acute/chronic respiratory failure secondary to CHF exacerbation.    PT Comments    Pt received upright in bed agreeable to PT. Reports feeling fatigued from frequent visits from hospital staff but is agreeable to treatment to her tolerance. Resting HR at 78 BPM. Able to stand to RW with minguard with VC's for hand placement with poor carryover. Amb with supervision slowly but consistently 42' in room (32' +10') with seated rest at [facility] between amb bouts. Returned to recliner with poor eccentric control but safe RW sequencing and cuing for hand placement with fair carryover. Max HR achieved at 85 BPM with ambulation. All needs in reach. D/c recs remain appropriate at this time. Will benefit from [facility] PT to optimize LE strength/endurance with functional mobility to maintain independence with ADL's.   Recommendations for follow up therapy are one component of a multi-disciplinary discharge planning process, led by the attending physician.  Recommendations may be updated based on patient status, additional functional criteria and insurance authorization.  Follow Up Recommendations  Home health PT     Assistance Recommended at Discharge PRN  Equipment Recommendations  None recommended by PT    Recommendations for Other Services       Precautions / Restrictions Precautions Precautions: Fall Restrictions Weight Bearing Restrictions: No     Mobility  Bed Mobility               General bed mobility comments: In chair pre/post session Patient Response: Cooperative  Transfers   Equipment used:  Rolling walker (2 wheels) Transfers: Sit to/from Stand Sit to Stand: Min guard           General transfer comment: cuing for hand placement, tends to pull on RW despite cues.    Ambulation/Gait Ambulation/Gait assistance: Supervision Gait Distance (Feet): 42 Feet (32' + 10') Assistive device: Rolling walker (2 wheels) Gait Pattern/deviations: Step-to pattern;Trunk flexed       General Gait Details: Keeping RW closer to BOS today compared to eval per EMR. Safe sequencing of RW. DOes require standing and seated rest bout during ambulation.   Stairs             Wheelchair Mobility    Modified Rankin (Stroke Patients Only)       Balance Overall balance assessment: Needs assistance Sitting-balance support: No upper extremity supported;Feet supported Sitting balance-Leahy Scale: Good     Standing balance support: Bilateral upper extremity supported;During functional activity Standing balance-Leahy Scale: Fair Standing balance comment: use of RW                            Cognition Arousal/Alertness: Awake/alert Behavior During Therapy: WFL for tasks assessed/performed Overall Cognitive Status: Within Functional Limits for tasks assessed                                 General Comments: Pleasant and cooperative throughout        Exercises Other Exercises Other Exercises: safe use of DME, progressive mobility to maintain strength/independence with ADL completion  General Comments General comments (skin integrity, edema, etc.): resting HR in 70's up to 85 BPM with ambulation      Pertinent Vitals/Pain Pain Assessment: No/denies pain    Home Living                          Prior Function            PT Goals (current goals can now be found in the care plan section) Acute Rehab PT Goals PT Goal Formulation: With patient Time For Goal Achievement: 02/17/21 Potential to Achieve Goals: Good Progress towards PT  goals: Progressing toward goals    Frequency    Min 2X/week      PT Plan Current plan remains appropriate    Co-evaluation              AM-PAC PT "6 Clicks" Mobility   Outcome Measure  Help needed turning from your back to your side while in a flat bed without using bedrails?: A Little Help needed moving from lying on your back to sitting on the side of a flat bed without using bedrails?: A Little Help needed moving to and from a bed to a chair (including a wheelchair)?: A Little Help needed standing up from a chair using your arms (e.g., wheelchair or bedside chair)?: A Little Help needed to walk in hospital room?: A Little Help needed climbing 3-5 steps with a railing? : A Little 6 Click Score: 18    End of Session Equipment Utilized During Treatment: Gait belt Activity Tolerance: Patient tolerated treatment well Patient left: in chair;with chair alarm set;with call bell/phone within reach Nurse Communication: Mobility status PT Visit Diagnosis: Muscle weakness (generalized) (M62.81);Difficulty in walking, not elsewhere classified (R26.2)     Time: [id number] PT Time Calculation (min) (ACUTE ONLY): 25 min  Charges:  $Therapeutic Activity: 8-22 mins                     Salem Caster. Fairly IV, PT, DPT Physical Therapist- [facility]  02/06/2021, 11:58 AM

## 2021-02-06 NOTE — Progress Notes (Signed)
[facility] Cardiology [facility] Encounter Note  Patient: Rebekah Bridges / Admit Date: 02/02/2021 / Date of Encounter: 02/06/2021, 12:40 PM   Subjective: Patient currently with no complaints of shortness of breath at this time and denying any cough or congestion.  Heart rate continues to be well controlled with the use of beta-blockers.  Patient continues to be in atrial fibrillation.  She remains on Eliquis with no evidence of bleeding or bruising complications at this time.  Review of Systems: Positive for: none Negative for: Vision change, hearing change, syncope, dizziness, nausea, vomiting,diarrhea, bloody stool, stomach pain, cough, congestion, diaphoresis, urinary frequency, urinary pain,skin lesions, skin rashes Others previously listed  Objective: Telemetry: Atrial fibrillation with controlled ventricular rate Physical Exam: Blood pressure 121/82, pulse 76, temperature 98.3 F (36.8 C), resp. rate 18, height 4\' 11"  (1.499 m), weight 56.5 kg, SpO2 100 %. Body mass index is 25.16 kg/m. General: Well developed, well nourished, in no acute distress. Head: Normocephalic, atraumatic, sclera non-icteric, no xanthomas, nares are without discharge. Neck: No apparent masses Lungs: Normal respirations with few new wheezes, some rhonchi, no rales , no crackles   Heart: Irregular rate and rhythm, normal S1 S2, no murmur, no rub, no gallop, PMI is normal size and placement, carotid upstroke normal without bruit, jugular venous pressure normal Abdomen: Soft, non-tender, non-distended with normoactive bowel sounds. No hepatosplenomegaly. Abdominal aorta is normal size without bruit Extremities: No edema, no clubbing, no cyanosis, no ulcers,  Peripheral: 2+ radial, 2+ femoral, 2+ dorsal pedal pulses Neuro: Alert and oriented. Moves all extremities spontaneously. Psych:  Responds to questions appropriately with a normal affect.   Intake/Output Summary (Last 24 hours) at 02/06/2021 1240 Last  data filed at 02/05/2021 1909 Gross per 24 hour  Intake 924.88 ml  Output --  Net 924.88 ml    Inpatient Medications:   apixaban  2.5 mg Oral BID   atorvastatin  80 mg Oral Daily   cephALEXin  500 mg Oral BID   isosorbide mononitrate  15 mg Oral Daily   metoprolol tartrate  50 mg Oral BID   sodium chloride flush  3 mL Intravenous Q12H   Infusions:   sodium chloride     sodium chloride 50 mL/hr at 02/05/21 1050    Labs: Recent Labs    02/05/21 0534 02/06/21 0613  NA 126* 127*  K 3.7 3.9  CL 94* 97*  CO2 26 24  GLUCOSE 103* 92  BUN 36* 28*  CREATININE 1.20* 0.81  CALCIUM 7.7* 7.6*   No results for input(s): AST, ALT, ALKPHOS, BILITOT, PROT, ALBUMIN in the last 72 hours.  No results for input(s): WBC, NEUTROABS, HGB, HCT, MCV, PLT in the last 72 hours.  No results for input(s): CKTOTAL, CKMB, TROPONINI in the last 72 hours. Invalid input(s): POCBNP No results for input(s): HGBA1C in the last 72 hours.   Weights: Filed Weights   02/04/21 0500 02/05/21 0435 02/06/21 0604  Weight: 53.1 kg 55 kg 56.5 kg     Radiology/Studies:  DG Chest 2 View  Result Date: 02/02/2021 CLINICAL DATA:  AMS, ?SOB EXAM: CHEST - 2 VIEW COMPARISON:  10/19/2020. FINDINGS: Chronic increased lung markings. Mild left basilar opacities. No visible pleural effusions or pneumothorax. Cardiomediastinal silhouette is similar. Calcific atherosclerosis of the aorta. Vertebral augmentation at multiple thoracic levels. Polyarticular degenerative change. IMPRESSION: Mild left basilar opacities, likely atelectasis. Electronically Signed   By: 10/21/2020 M.D.   On: 02/02/2021 15:55   CT HEAD WO CONTRAST (02/04/2021)  Result  Date: 02/02/2021 CLINICAL DATA:  Mental status change, unknown cause EXAM: CT HEAD WITHOUT CONTRAST TECHNIQUE: Contiguous axial images were obtained from the base of the skull through the vertex without intravenous contrast. COMPARISON:  04/28/2020 FINDINGS: Brain: There is no acute  intracranial hemorrhage, mass effect, or edema. Gray-white differentiation is preserved. There is no extra-axial fluid collection. Stable prominence of ventricles and sulci reflecting parenchymal volume loss. Patchy and confluent hypoattenuation in the supratentorial white matter probably reflects stable chronic microvascular ischemic changes. Vascular: There is atherosclerotic calcification at the skull base. Skull: Calvarium is unremarkable. Sinuses/Orbits: Mild mucosal thickening. No acute orbital abnormality. Other: None. IMPRESSION: No acute intracranial abnormality. Stable chronic findings detailed above. Electronically Signed   By: Macy Mis M.D.   On: 02/02/2021 15:36   CT CHEST WO CONTRAST  Result Date: 02/05/2021 CLINICAL DATA:  Rule out lung mass. EXAM: CT CHEST WITHOUT CONTRAST TECHNIQUE: Multidetector CT imaging of the chest was performed following the standard protocol without IV contrast. COMPARISON:  Chest radiograph 02/02/2021 FINDINGS: Cardiovascular: No significant vascular findings. Scattered aortic calcifications. Multivessel coronary artery calcification. Normal heart size. No pericardial effusion. Mediastinum/Nodes: There are few calcified mediastinal lymph nodes. No mediastinal, hilar, or axillary lymphadenopathy. Two hypodense subcentimeter nodules are noted in the mid right and lower left thyroid (series 2, image 9, 17). No abnormality of the trachea or esophagus. Lungs/Pleura: Trace bilateral pleural effusions with adjacent compressive atelectasis at the lung bases. Calcified granulomas in the left upper lung. No pneumothorax. Upper Abdomen: Scattered calcified granulomas in the liver and spleen. No acute abnormality of the visualized upper abdomen. Musculoskeletal: No chest wall mass or suspicious bone lesions identified. Vertebral body augmentation of the T11 and T12 vertebral bodies. Compression fracture of the superior endplate of the T4 vertebral body with less than 25%  vertebral body height loss IMPRESSION: 1. No pulmonary mass as queried. 2. Trace bilateral pleural effusions with adjacent compressive atelectasis in the lung bases. 3. Age-indeterminate compression fracture of the T4 vertebral body superior endplate with less than 25% vertebral body height loss. 4. Aortic Atherosclerosis (ICD10-I70.0). Multivessel coronary artery disease. 5. Findings of prior granulomatous disease in the thorax and upper abdomen. 6. Subcentimeter incidental thyroid nodules. Not clinically significant; no follow-up imaging recommended (ref: J Am Coll Radiol. 2015 Feb;12(2): 143-50). Electronically Signed   By: Ileana Roup M.D.   On: 02/05/2021 15:21     Assessment and Recommendation  87 y.o. female with chronic kidney disease stage III with acute infection elevated troponin possibly consistent with demand ischemia and atrial fibrillation with rapid ventricular rate now better controlled  Plan: 1.  Continue metoprolol for better heart rate control with goal heart rate between 60 and 90 bpm 2.  Echocardiogram for LV systolic dysfunction valvular heart disease 3.  Continue anticoagulation for further risk reduction and stroke with atrial fibrillation and Eliquis 2.5 mg twice per day  4.  Continue supportive care of infection exacerbating above 5.  Begin ambulation and follow-up for improvements of symptoms and possible discharge to home and/or other facility if heart rate is controlled with follow-up later than the week for further adjustments of medication management  Signed, Jettie Booze, PA-C

## 2021-02-06 NOTE — Care Management Important Message (Signed)
Important Message  Patient Details  Name: Rebekah Bridges MRN: [id number] Date of Birth: 1915-11-21   Medicare Important Message Given:  Yes     Johnell Comings 02/06/2021, 3:16 PM

## 2021-02-06 NOTE — Progress Notes (Signed)
*  PRELIMINARY RESULTS* Echocardiogram 2D Echocardiogram has been performed.  Cristela Blue 02/06/2021, 1:52 PM

## 2021-02-06 NOTE — Progress Notes (Signed)
[facility] NAME: Rebekah Bridges    MR#:  [id number]  DATE OF BIRTH:  1915/09/02  SUBJECTIVE:   Patient resting comfortably. Denies chest pain or shortness of breath. Not able to get much history from her. She is breathing comfortably sitting out in the recliner chair. REVIEW OF SYSTEMS:   Review of Systems  Constitutional:  Negative for chills, fever and weight loss.  HENT:  Negative for ear discharge, ear pain and nosebleeds.   Eyes:  Negative for blurred vision, pain and discharge.  Respiratory:  Negative for sputum production, shortness of breath, wheezing and stridor.   Cardiovascular:  Negative for chest pain, palpitations, orthopnea and PND.  Gastrointestinal:  Negative for abdominal pain, diarrhea, nausea and vomiting.  Genitourinary:  Negative for frequency and urgency.  Musculoskeletal:  Negative for back pain and joint pain.  Neurological:  Positive for weakness. Negative for sensory change, speech change and focal weakness.  Psychiatric/Behavioral:  Negative for depression and hallucinations. The patient is not nervous/anxious.   Tolerating Diet:yes Tolerating PT: HHPT  DRUG ALLERGIES:   Allergies  Allergen Reactions   Hydrocodone Nausea Only   Meloxicam Nausea Only    Upset stomach   Other Other (See Comments)   Tramadol Hcl Nausea Only and Other (See Comments)    sweating   Penicillins Nausea Only and Rash   Sulfa Antibiotics Nausea Only, Hives and Rash    And rash    VITALS:  Blood pressure 128/82, pulse 85, temperature 97.6 F (36.4 C), resp. rate 18, height 4\' 11"  (1.499 m), weight 56.5 kg, SpO2 97 %.  PHYSICAL EXAMINATION:   Physical Exam  GENERAL:  87 y.o.-year-old patient lying in the bed with no acute distress.  HEENT: Head atraumatic, normocephalic.  LUNGS: Normal breath sounds bilaterally, no wheezing, rales, rhonchi. No use of accessory muscles of respiration.  CARDIOVASCULAR: S1, S2 normal. No  murmurs, rubs, or gallops. Irregularly irregular ABDOMEN: Soft, nontender, nondistended. Bowel sounds present. No organomegaly or mass.  EXTREMITIES: No cyanosis, clubbing or edema b/l.    NEUROLOGIC: nonfocal PSYCHIATRIC:  patient is alert and oriented x 3.  SKIN: skin dry  LABORATORY PANEL:  CBC Recent Labs  Lab 02/02/21 1551  WBC 9.3  HGB 11.3*  HCT 35.0*  PLT 126*     Chemistries  Recent Labs  Lab 02/02/21 1551 02/03/21 0636 02/04/21 0435 02/06/21 0613  NA 127* 129*   < > 127*  K 3.9 3.6   < > 3.9  CL 92* 94*   < > 97*  CO2 27 26   < > 24  GLUCOSE 116* 112*   < > 92  BUN 32* 34*   < > 28*  CREATININE 0.86 1.26*   < > 0.81  CALCIUM 8.5* 8.2*   < > 7.6*  MG  --  2.0  --   --   AST 50*  --   --   --   ALT 15  --   --   --   ALKPHOS 64  --   --   --   BILITOT 1.1  --   --   --    < > = values in this interval not displayed.    Cardiac Enzymes No results for input(s): TROPONINI in the last 168 hours. RADIOLOGY:  CT CHEST WO CONTRAST  Result Date: 02/05/2021 CLINICAL DATA:  Rule out lung mass. EXAM: CT CHEST WITHOUT CONTRAST TECHNIQUE: Multidetector CT imaging  of the chest was performed following the standard protocol without IV contrast. COMPARISON:  Chest radiograph 02/02/2021 FINDINGS: Cardiovascular: No significant vascular findings. Scattered aortic calcifications. Multivessel coronary artery calcification. Normal heart size. No pericardial effusion. Mediastinum/Nodes: There are few calcified mediastinal lymph nodes. No mediastinal, hilar, or axillary lymphadenopathy. Two hypodense subcentimeter nodules are noted in the mid right and lower left thyroid (series 2, image 9, 17). No abnormality of the trachea or esophagus. Lungs/Pleura: Trace bilateral pleural effusions with adjacent compressive atelectasis at the lung bases. Calcified granulomas in the left upper lung. No pneumothorax. Upper Abdomen: Scattered calcified granulomas in the liver and spleen. No acute  abnormality of the visualized upper abdomen. Musculoskeletal: No chest wall mass or suspicious bone lesions identified. Vertebral body augmentation of the T11 and T12 vertebral bodies. Compression fracture of the superior endplate of the T4 vertebral body with less than 25% vertebral body height loss IMPRESSION: 1. No pulmonary mass as queried. 2. Trace bilateral pleural effusions with adjacent compressive atelectasis in the lung bases. 3. Age-indeterminate compression fracture of the T4 vertebral body superior endplate with less than 25% vertebral body height loss. 4. Aortic Atherosclerosis (ICD10-I70.0). Multivessel coronary artery disease. 5. Findings of prior granulomatous disease in the thorax and upper abdomen. 6. Subcentimeter incidental thyroid nodules. Not clinically significant; no follow-up imaging recommended (ref: J Am Coll Radiol. 2015 Feb;12(2): 143-50). Electronically Signed   By: Ileana Roup M.D.   On: 02/05/2021 15:21   ECHOCARDIOGRAM COMPLETE  Result Date: 02/06/2021    ECHOCARDIOGRAM REPORT   Patient Name:   Rebekah Bridges Date of Exam: 02/06/2021 Medical Rec #:  [id number]       Height:       59.0 in Accession #:    [id number]      Weight:       124.6 lb Date of Birth:  24-Jun-1915       BSA:          1.508 m Patient Age:    87 years       BP:           121/82 mmHg Patient Gender: F               HR:           76 bpm. Exam Location:  [facility] Procedure: 2D Echo, Cardiac Doppler and Color Doppler Indications:     Atrial Fibrillation I48.91                  CHF-acute systolic [id number]  History:         Patient has prior history of Echocardiogram examinations, most                  recent 10/20/2020. Paroxysmal Atrial Fibrillation.  Sonographer:     Sherrie Sport Referring Phys:  [id number] AMANDA TOBIN Diagnosing Phys: Kathlyn Sacramento MD  Sonographer Comments: No parasternal window, suboptimal apical window and Technically challenging study due to limited acoustic windows. IMPRESSIONS  1. Left ventricular  ejection fraction, by estimation, is 40 to 45%. The left ventricle has mildly decreased function. The left ventricle demonstrates regional wall motion abnormalities (see scoring diagram/findings for description). There is mild left ventricular hypertrophy. Left ventricular diastolic parameters are indeterminate. There is severe hypokinesis of the left ventricular, mid-apical anteroseptal wall, anterior wall and apical segment.  2. Right ventricular systolic function is normal. The right ventricular size is normal. There is normal pulmonary artery systolic pressure.  3. The mitral valve is  normal in structure. Mild mitral valve regurgitation. No evidence of mitral stenosis. Moderate mitral annular calcification.  4. The aortic valve is normal in structure. Aortic valve regurgitation is mild. Aortic valve sclerosis is present, with no evidence of aortic valve stenosis.  5. The inferior vena cava is normal in size with greater than 50% respiratory variability, suggesting right atrial pressure of 3 mmHg. FINDINGS  Left Ventricle: Left ventricular ejection fraction, by estimation, is 40 to 45%. The left ventricle has mildly decreased function. The left ventricle demonstrates regional wall motion abnormalities. Severe hypokinesis of the left ventricular, mid-apical  anteroseptal wall, anterior wall and apical segment. The left ventricular internal cavity size was normal in size. There is mild left ventricular hypertrophy. Left ventricular diastolic parameters are indeterminate. Right Ventricle: The right ventricular size is normal. No increase in right ventricular wall thickness. Right ventricular systolic function is normal. There is normal pulmonary artery systolic pressure. The tricuspid regurgitant velocity is 2.82 m/s, and  with an assumed right atrial pressure of 3 mmHg, the estimated right ventricular systolic pressure is [id number]. Left Atrium: Left atrial size was normal in size. Right Atrium: Right atrial size  was normal in size. Pericardium: There is no evidence of pericardial effusion. Mitral Valve: The mitral valve is normal in structure. Moderate mitral annular calcification. Mild mitral valve regurgitation. No evidence of mitral valve stenosis. MV peak gradient, 7.0 mmHg. The mean mitral valve gradient is 2.0 mmHg. Tricuspid Valve: The tricuspid valve is normal in structure. Tricuspid valve regurgitation is trivial. No evidence of tricuspid stenosis. Aortic Valve: The aortic valve is normal in structure. Aortic valve regurgitation is mild. Aortic valve sclerosis is present, with no evidence of aortic valve stenosis. Aortic valve mean gradient measures 2.0 mmHg. Aortic valve peak gradient measures 4.0  mmHg. Aortic valve area, by VTI measures 3.14 cm. Pulmonic Valve: The pulmonic valve was normal in structure. Pulmonic valve regurgitation is not visualized. No evidence of pulmonic stenosis. Aorta: The aortic root is normal in size and structure. Venous: The inferior vena cava is normal in size with greater than 50% respiratory variability, suggesting right atrial pressure of 3 mmHg. IAS/Shunts: No atrial level shunt detected by color flow Doppler.  LEFT VENTRICLE PLAX 2D LVIDd:         3.80 cm LVIDs:         2.30 cm LV PW:         1.10 cm LV IVS:        1.00 cm LVOT diam:     2.00 cm LV SV:         48 LV SV Index:   32 LVOT Area:     3.14 cm  RIGHT VENTRICLE RV Basal diam:  3.60 cm RV S prime:     11.30 cm/s TAPSE (M-mode): 3.1 cm LEFT ATRIUM           Index        RIGHT ATRIUM           Index LA diam:      3.80 cm 2.52 cm/m   RA Area:     15.50 cm LA Vol (A2C): 73.0 ml 48.40 ml/m  RA Volume:   42.30 ml  28.05 ml/m LA Vol (A4C): 17.9 ml 11.87 ml/m  AORTIC VALVE AV Area (Vmax):    2.75 cm AV Area (Vmean):   2.82 cm AV Area (VTI):     3.14 cm AV Vmax:  100.00 cm/s AV Vmean:          67.200 cm/s AV VTI:            0.153 m AV Peak Grad:      4.0 mmHg AV Mean Grad:      2.0 mmHg LVOT Vmax:         87.50  cm/s LVOT Vmean:        60.300 cm/s LVOT VTI:          0.153 m LVOT/AV VTI ratio: 1.00  AORTA Ao Root diam: 2.20 cm MITRAL VALVE                TRICUSPID VALVE MV Area (PHT): 5.50 cm     TR Peak grad:   31.8 mmHg MV Area VTI:   2.91 cm     TR Vmax:        282.00 cm/s MV Peak grad:  7.0 mmHg MV Mean grad:  2.0 mmHg     SHUNTS MV Vmax:       1.32 m/s     Systemic VTI:  0.15 m MV Vmean:      63.8 cm/s    Systemic Diam: 2.00 cm MV Decel Time: 138 msec MV E velocity: 104.00 cm/s MV A velocity: 47.10 cm/s MV E/A ratio:  2.21 Kathlyn Sacramento MD Electronically signed by Kathlyn Sacramento MD Signature Date/Time: 02/06/2021/3:18:02 PM    Final    ASSESSMENT AND PLAN:  87 year old female with history of hypertension, chronic combined heart failure, osteoporosis, paroxysmal A. fib, chronic respiratory failure on 2 L oxygen from assisted living facility presented to the ER with multiple complaints including burning urination, weakness and falling 2 days ago without any physical injury.  In the emergency room heart rate 110, afebrile.  100% on 2 L oxygen.  Sodium 127.  Urinalysis with likely infection.   Acute UTI present on admission: E. coli pansensitive.  Continue Rocephin due to ongoing temperature.  No evidence of urinary retention. -- No fever. Change to PO Keflex.   Hypochloremic hyponatremia: Likely related to low oral intake.  Also possible intravascular volume overload due to CHF. Sodium level slightly improving.  Received Lasix.  Supportive treatment. -- Sodium continues to decrease. Today 126. -- Nephrology consultation with Dr. Zollie Scale. Recommends fluid at low rate. --12/19--na 127 creat to normal. Encourage patient to continue hydration  Paroxysmal A. fib: Atrial fibrillation. Seen by cardiology.  Started on anticoagulation with Eliquis.   Suspected acute on chronic combined heart failure exacerbation: Chronic hypoxemic respiratory failure. --Mildly elevated troponins without chest pain.  EKG  nonischemic. --Known ejection fraction 45 to 50% with mildly reduced ventricular ejection fraction. --Patient is fairly compensated and euvolemic. --Patient is already on heart failure therapy with Lasix 40 mg daily at home, metoprolol and nitrates.  --She uses 2 L oxygen at home and saturating 100%. --Mildly elevated troponins are likely secondary to demand ischemia.  No evidence of acute coronary syndrome.   --On aspirin, statin, beta-blockers and nitrates. -- Discontinue aspirin as starting on Eliquis.  Generalized weakness -- PT OT-- recommends home health  Family communication : Gwenlyn Fudge on the phone Consults : cardiology, nephrology CODE STATUS: DNR DVT Prophylaxis : eliquis Level of care: Telemetry Cardiac Status is: Inpatient  Remains inpatient appropriate because: low sodium  if continues to show improvement patient will return back to her ALF tomorrow. This was discussed with family.      TOTAL TIME TAKING CARE OF THIS PATIENT: 25 minutes.  >  50% time spent on counselling and coordination of care  Note: This dictation was prepared with Dragon dictation along with smaller phrase technology. Any transcriptional errors that result from this process are unintentional.  Enedina Finner M.D    [facility]   CC: Primary care physician; System, Provider Not In Patient ID: Rebekah Bridges, female   DOB: 1915-05-16, 87 y.o.   MRN: [id number]

## 2021-02-07 LAB — BASIC METABOLIC PANEL
Anion gap: 5 (ref 5–15)
BUN: 23 mg/dL (ref 8–23)
CO2: 23 mmol/L (ref 22–32)
Calcium: 7.2 mg/dL — ABNORMAL LOW (ref 8.9–10.3)
Chloride: 101 mmol/L (ref 98–111)
Creatinine, Ser: 0.71 mg/dL (ref 0.44–1.00)
GFR, Estimated: >60 mL/min (ref >60)
Glucose, Bld: 103 mg/dL — ABNORMAL HIGH (ref 70–99)
Potassium: 3.7 mmol/L (ref 3.5–5.1)
Sodium: 129 mmol/L — ABNORMAL LOW (ref 135–145)

## 2021-02-07 NOTE — Progress Notes (Signed)
[facility] Cardiology [facility] Encounter Note  Patient: Rebekah Bridges / Admit Date: 02/02/2021 / Date of Encounter: 02/07/2021, 7:10 AM   Subjective: Patient overall continuing to improve with less shortness of breath cough and congestion.  Heart rate control is better today with increase of beta-blocker from several days prior.  Atrial fibrillation remains.  Patient was placed on Eliquis without evidence of bleeding complications at this time.  Review of Systems: Positive for: None Negative for: Vision change, hearing change, syncope, dizziness, nausea, vomiting,diarrhea, bloody stool, stomach pain, cough, congestion, diaphoresis, urinary frequency, urinary pain,skin lesions, skin rashes Others previously listed  Objective: Telemetry: Atrial fibrillation with controlled ventricular rate Physical Exam: Blood pressure 127/61, pulse 78, temperature 98 F (36.7 C), temperature source Oral, resp. rate 18, height 4\' 11"  (1.499 m), weight 56.6 kg, SpO2 95 %. Body mass index is 25.2 kg/m. General: Well developed, well nourished, in no acute distress. Head: Normocephalic, atraumatic, sclera non-icteric, no xanthomas, nares are without discharge. Neck: No apparent masses Lungs: Normal respirations with few new wheezes, some rhonchi, no rales , no crackles   Heart: Irregular rate and rhythm, normal S1 S2, no murmur, no rub, no gallop, PMI is normal size and placement, carotid upstroke normal without bruit, jugular venous pressure normal Abdomen: Soft, non-tender, non-distended with normoactive bowel sounds. No hepatosplenomegaly. Abdominal aorta is normal size without bruit Extremities: No edema, no clubbing, no cyanosis, no ulcers,  Peripheral: 2+ radial, 2+ femoral, 2+ dorsal pedal pulses Neuro: Alert and oriented. Moves all extremities spontaneously. Psych:  Responds to questions appropriately with a normal affect.   Intake/Output Summary (Last 24 hours) at 02/07/2021 0710 Last data  filed at 02/07/2021 0500 Gross per 24 hour  Intake 1795.43 ml  Output 1250 ml  Net 545.43 ml     Inpatient Medications:   apixaban  2.5 mg Oral BID   atorvastatin  80 mg Oral Daily   cephALEXin  500 mg Oral BID   isosorbide mononitrate  15 mg Oral Daily   metoprolol tartrate  50 mg Oral BID   sodium chloride flush  3 mL Intravenous Q12H   Infusions:   sodium chloride     sodium chloride 50 mL/hr at 02/07/21 0334    Labs: Recent Labs    02/05/21 0534 02/06/21 0613  NA 126* 127*  K 3.7 3.9  CL 94* 97*  CO2 26 24  GLUCOSE 103* 92  BUN 36* 28*  CREATININE 1.20* 0.81  CALCIUM 7.7* 7.6*    No results for input(s): AST, ALT, ALKPHOS, BILITOT, PROT, ALBUMIN in the last 72 hours.  No results for input(s): WBC, NEUTROABS, HGB, HCT, MCV, PLT in the last 72 hours.  No results for input(s): CKTOTAL, CKMB, TROPONINI in the last 72 hours. Invalid input(s): POCBNP No results for input(s): HGBA1C in the last 72 hours.   Weights: Filed Weights   02/05/21 0435 02/06/21 0604 02/07/21 0413  Weight: 55 kg 56.5 kg 56.6 kg     Radiology/Studies:  DG Chest 2 View  Result Date: 02/02/2021 CLINICAL DATA:  AMS, ?SOB EXAM: CHEST - 2 VIEW COMPARISON:  10/19/2020. FINDINGS: Chronic increased lung markings. Mild left basilar opacities. No visible pleural effusions or pneumothorax. Cardiomediastinal silhouette is similar. Calcific atherosclerosis of the aorta. Vertebral augmentation at multiple thoracic levels. Polyarticular degenerative change. IMPRESSION: Mild left basilar opacities, likely atelectasis. Electronically Signed   By: Margaretha Sheffield M.D.   On: 02/02/2021 15:55   CT HEAD WO CONTRAST (5MM)  Result Date: 02/02/2021 CLINICAL  DATA:  Mental status change, unknown cause EXAM: CT HEAD WITHOUT CONTRAST TECHNIQUE: Contiguous axial images were obtained from the base of the skull through the vertex without intravenous contrast. COMPARISON:  04/28/2020 FINDINGS: Brain: There is no  acute intracranial hemorrhage, mass effect, or edema. Gray-white differentiation is preserved. There is no extra-axial fluid collection. Stable prominence of ventricles and sulci reflecting parenchymal volume loss. Patchy and confluent hypoattenuation in the supratentorial white matter probably reflects stable chronic microvascular ischemic changes. Vascular: There is atherosclerotic calcification at the skull base. Skull: Calvarium is unremarkable. Sinuses/Orbits: Mild mucosal thickening. No acute orbital abnormality. Other: None. IMPRESSION: No acute intracranial abnormality. Stable chronic findings detailed above. Electronically Signed   By: Macy Mis M.D.   On: 02/02/2021 15:36   CT CHEST WO CONTRAST  Result Date: 02/05/2021 CLINICAL DATA:  Rule out lung mass. EXAM: CT CHEST WITHOUT CONTRAST TECHNIQUE: Multidetector CT imaging of the chest was performed following the standard protocol without IV contrast. COMPARISON:  Chest radiograph 02/02/2021 FINDINGS: Cardiovascular: No significant vascular findings. Scattered aortic calcifications. Multivessel coronary artery calcification. Normal heart size. No pericardial effusion. Mediastinum/Nodes: There are few calcified mediastinal lymph nodes. No mediastinal, hilar, or axillary lymphadenopathy. Two hypodense subcentimeter nodules are noted in the mid right and lower left thyroid (series 2, image 9, 17). No abnormality of the trachea or esophagus. Lungs/Pleura: Trace bilateral pleural effusions with adjacent compressive atelectasis at the lung bases. Calcified granulomas in the left upper lung. No pneumothorax. Upper Abdomen: Scattered calcified granulomas in the liver and spleen. No acute abnormality of the visualized upper abdomen. Musculoskeletal: No chest wall mass or suspicious bone lesions identified. Vertebral body augmentation of the T11 and T12 vertebral bodies. Compression fracture of the superior endplate of the T4 vertebral body with less than  25% vertebral body height loss IMPRESSION: 1. No pulmonary mass as queried. 2. Trace bilateral pleural effusions with adjacent compressive atelectasis in the lung bases. 3. Age-indeterminate compression fracture of the T4 vertebral body superior endplate with less than 25% vertebral body height loss. 4. Aortic Atherosclerosis (ICD10-I70.0). Multivessel coronary artery disease. 5. Findings of prior granulomatous disease in the thorax and upper abdomen. 6. Subcentimeter incidental thyroid nodules. Not clinically significant; no follow-up imaging recommended (ref: J Am Coll Radiol. 2015 Feb;12(2): 143-50). Electronically Signed   By: Ileana Roup M.D.   On: 02/05/2021 15:21   ECHOCARDIOGRAM COMPLETE  Result Date: 02/06/2021    ECHOCARDIOGRAM REPORT   Patient Name:   Rebekah Bridges Date of Exam: 02/06/2021 Medical Rec #:  [id number]       Height:       59.0 in Accession #:    [id number]      Weight:       124.6 lb Date of Birth:  March 15, 1915       BSA:          1.508 m Patient Age:    87 years       BP:           121/82 mmHg Patient Gender: F               HR:           76 bpm. Exam Location:  [facility] Procedure: 2D Echo, Cardiac Doppler and Color Doppler Indications:     Atrial Fibrillation I48.91                  CHF-acute systolic [id number]  History:         Patient  has prior history of Echocardiogram examinations, most                  recent 10/20/2020. Paroxysmal Atrial Fibrillation.  Sonographer:     Sherrie Sport Referring Phys:  [id number] AMANDA TOBIN Diagnosing Phys: Kathlyn Sacramento MD  Sonographer Comments: No parasternal window, suboptimal apical window and Technically challenging study due to limited acoustic windows. IMPRESSIONS  1. Left ventricular ejection fraction, by estimation, is 40 to 45%. The left ventricle has mildly decreased function. The left ventricle demonstrates regional wall motion abnormalities (see scoring diagram/findings for description). There is mild left ventricular hypertrophy. Left  ventricular diastolic parameters are indeterminate. There is severe hypokinesis of the left ventricular, mid-apical anteroseptal wall, anterior wall and apical segment.  2. Right ventricular systolic function is normal. The right ventricular size is normal. There is normal pulmonary artery systolic pressure.  3. The mitral valve is normal in structure. Mild mitral valve regurgitation. No evidence of mitral stenosis. Moderate mitral annular calcification.  4. The aortic valve is normal in structure. Aortic valve regurgitation is mild. Aortic valve sclerosis is present, with no evidence of aortic valve stenosis.  5. The inferior vena cava is normal in size with greater than 50% respiratory variability, suggesting right atrial pressure of 3 mmHg. FINDINGS  Left Ventricle: Left ventricular ejection fraction, by estimation, is 40 to 45%. The left ventricle has mildly decreased function. The left ventricle demonstrates regional wall motion abnormalities. Severe hypokinesis of the left ventricular, mid-apical  anteroseptal wall, anterior wall and apical segment. The left ventricular internal cavity size was normal in size. There is mild left ventricular hypertrophy. Left ventricular diastolic parameters are indeterminate. Right Ventricle: The right ventricular size is normal. No increase in right ventricular wall thickness. Right ventricular systolic function is normal. There is normal pulmonary artery systolic pressure. The tricuspid regurgitant velocity is 2.82 m/s, and  with an assumed right atrial pressure of 3 mmHg, the estimated right ventricular systolic pressure is [id number]. Left Atrium: Left atrial size was normal in size. Right Atrium: Right atrial size was normal in size. Pericardium: There is no evidence of pericardial effusion. Mitral Valve: The mitral valve is normal in structure. Moderate mitral annular calcification. Mild mitral valve regurgitation. No evidence of mitral valve stenosis. MV peak gradient,  7.0 mmHg. The mean mitral valve gradient is 2.0 mmHg. Tricuspid Valve: The tricuspid valve is normal in structure. Tricuspid valve regurgitation is trivial. No evidence of tricuspid stenosis. Aortic Valve: The aortic valve is normal in structure. Aortic valve regurgitation is mild. Aortic valve sclerosis is present, with no evidence of aortic valve stenosis. Aortic valve mean gradient measures 2.0 mmHg. Aortic valve peak gradient measures 4.0  mmHg. Aortic valve area, by VTI measures 3.14 cm. Pulmonic Valve: The pulmonic valve was normal in structure. Pulmonic valve regurgitation is not visualized. No evidence of pulmonic stenosis. Aorta: The aortic root is normal in size and structure. Venous: The inferior vena cava is normal in size with greater than 50% respiratory variability, suggesting right atrial pressure of 3 mmHg. IAS/Shunts: No atrial level shunt detected by color flow Doppler.  LEFT VENTRICLE PLAX 2D LVIDd:         3.80 cm LVIDs:         2.30 cm LV PW:         1.10 cm LV IVS:        1.00 cm LVOT diam:     2.00 cm LV SV:  48 LV SV Index:   32 LVOT Area:     3.14 cm  RIGHT VENTRICLE RV Basal diam:  3.60 cm RV S prime:     11.30 cm/s TAPSE (M-mode): 3.1 cm LEFT ATRIUM           Index        RIGHT ATRIUM           Index LA diam:      3.80 cm 2.52 cm/m   RA Area:     15.50 cm LA Vol (A2C): 73.0 ml 48.40 ml/m  RA Volume:   42.30 ml  28.05 ml/m LA Vol (A4C): 17.9 ml 11.87 ml/m  AORTIC VALVE AV Area (Vmax):    2.75 cm AV Area (Vmean):   2.82 cm AV Area (VTI):     3.14 cm AV Vmax:           100.00 cm/s AV Vmean:          67.200 cm/s AV VTI:            0.153 m AV Peak Grad:      4.0 mmHg AV Mean Grad:      2.0 mmHg LVOT Vmax:         87.50 cm/s LVOT Vmean:        60.300 cm/s LVOT VTI:          0.153 m LVOT/AV VTI ratio: 1.00  AORTA Ao Root diam: 2.20 cm MITRAL VALVE                TRICUSPID VALVE MV Area (PHT): 5.50 cm     TR Peak grad:   31.8 mmHg MV Area VTI:   2.91 cm     TR Vmax:         282.00 cm/s MV Peak grad:  7.0 mmHg MV Mean grad:  2.0 mmHg     SHUNTS MV Vmax:       1.32 m/s     Systemic VTI:  0.15 m MV Vmean:      63.8 cm/s    Systemic Diam: 2.00 cm MV Decel Time: 138 msec MV E velocity: 104.00 cm/s MV A velocity: 47.10 cm/s MV E/A ratio:  2.21 Lorine Bears MD Electronically signed by Lorine Bears MD Signature Date/Time: 02/06/2021/3:18:02 PM    Final      Assessment and Recommendation  87 y.o. female with chronic kidney disease stage III with acute infection elevated troponin possibly consistent with demand ischemia and atrial fibrillation with rapid ventricular rate now better controlled 1.  Continue metoprolol for better heart rate control with goal heart rate between 60 and 90 bpm 2.  No further cardiac diagnostics necessary at this time 3.  Continuation of Eliquis for further risk reduction of stroke with atrial fibrillation 4.  Continue supportive care of infection exacerbating above 5.  Begin ambulation and follow-up for improvements of symptoms and possible discharge to home and/or other facility if heart rate is controlled with follow-up later than the week for further adjustments of medication management 6.  Call if further questions Signed, Arnoldo Hooker M.D. FACC

## 2021-02-07 NOTE — TOC Progression Note (Signed)
Transition of Care [facility]) - Progression Note    Patient Details  Name: Rebekah Bridges MRN: [id number] Date of Birth: 1915/03/16  Transition of Care [facility]) CM/SW Contact  Gildardo Griffes, [place] Phone Number: 02/07/2021, 9:02 AM  Clinical Narrative:     TOC following for medical readiness to return to [facility] with [facility].   Per MD patient has low sodium, if continues to show improvement patient will return back to her ALF once NA >130    Barriers to Discharge: Continued Medical Work up  Expected Discharge Plan and Services                                                 Social Determinants of Health (SDOH) Interventions    Readmission Risk Interventions Readmission Risk Prevention Plan 06/30/2020  Post Dischage Appt Complete  Medication Screening Complete  Transportation Screening Complete  Some recent data might be hidden

## 2021-02-07 NOTE — Progress Notes (Signed)
[facility] [place] NAME: Rebekah Bridges    MR#:  [id number]  DATE OF BIRTH:  [birth year]  SUBJECTIVE:   Patient resting comfortably. Denies chest pain or shortness of breath. Not able to get much history from her. She is breathing comfortably No new complaints.  REVIEW OF SYSTEMS:   Review of Systems  Constitutional:  Negative for chills, fever and weight loss.  HENT:  Negative for ear discharge, ear pain and nosebleeds.   Eyes:  Negative for blurred vision, pain and discharge.  Respiratory:  Negative for sputum production, shortness of breath, wheezing and stridor.   Cardiovascular:  Negative for chest pain, palpitations, orthopnea and PND.  Gastrointestinal:  Negative for abdominal pain, diarrhea, nausea and vomiting.  Genitourinary:  Negative for frequency and urgency.  Musculoskeletal:  Negative for back pain and joint pain.  Neurological:  Positive for weakness. Negative for sensory change, speech change and focal weakness.  Psychiatric/Behavioral:  Negative for depression and hallucinations. The patient is not nervous/anxious.   Tolerating Diet:yes Tolerating PT: HHPT  DRUG ALLERGIES:   Allergies  Allergen Reactions   Hydrocodone Nausea Only   Meloxicam Nausea Only    Upset stomach   Other Other (See Comments)   Tramadol Hcl Nausea Only and Other (See Comments)    sweating   Penicillins Nausea Only and Rash   Sulfa Antibiotics Nausea Only, Hives and Rash    And rash    VITALS:  Blood pressure 117/76, pulse 84, temperature 98.1 F (36.7 C), temperature source Oral, resp. rate 18, height 4\' 11"  (1.499 m), weight 56.6 kg, SpO2 95 %.  PHYSICAL EXAMINATION:   Physical Exam  GENERAL:  87 y.o.-year-old patient lying in the bed with no acute distress.  HEENT: Head atraumatic, normocephalic.  LUNGS: Normal breath sounds bilaterally, no wheezing, rales, rhonchi. No use of accessory muscles of respiration.  CARDIOVASCULAR: S1, S2  normal. No murmurs, rubs, or gallops. Irregularly irregular ABDOMEN: Soft, nontender, nondistended. Bowel sounds present. No organomegaly or mass.  EXTREMITIES: No cyanosis, clubbing or edema b/l.    NEUROLOGIC: nonfocal PSYCHIATRIC:  patient is alert and oriented x 3.  SKIN: skin dry  LABORATORY PANEL:  CBC Recent Labs  Lab 02/02/21 1551  WBC 9.3  HGB 11.3*  HCT 35.0*  PLT 126*     Chemistries  Recent Labs  Lab 02/02/21 1551 02/03/21 0636 02/04/21 0435 02/07/21 0630  NA 127* 129*   < > 129*  K 3.9 3.6   < > 3.7  CL 92* 94*   < > 101  CO2 27 26   < > 23  GLUCOSE 116* 112*   < > 103*  BUN 32* 34*   < > 23  CREATININE 0.86 1.26*   < > 0.71  CALCIUM 8.5* 8.2*   < > 7.2*  MG  --  2.0  --   --   AST 50*  --   --   --   ALT 15  --   --   --   ALKPHOS 64  --   --   --   BILITOT 1.1  --   --   --    < > = values in this interval not displayed.    Cardiac Enzymes No results for input(s): TROPONINI in the last 168 hours. RADIOLOGY:  CT CHEST WO CONTRAST  Result Date: 02/05/2021 CLINICAL DATA:  Rule out lung mass. EXAM: CT CHEST WITHOUT CONTRAST TECHNIQUE: Multidetector CT  imaging of the chest was performed following the standard protocol without IV contrast. COMPARISON:  Chest radiograph 02/02/2021 FINDINGS: Cardiovascular: No significant vascular findings. Scattered aortic calcifications. Multivessel coronary artery calcification. Normal heart size. No pericardial effusion. Mediastinum/Nodes: There are few calcified mediastinal lymph nodes. No mediastinal, hilar, or axillary lymphadenopathy. Two hypodense subcentimeter nodules are noted in the mid right and lower left thyroid (series 2, image 9, 17). No abnormality of the trachea or esophagus. Lungs/Pleura: Trace bilateral pleural effusions with adjacent compressive atelectasis at the lung bases. Calcified granulomas in the left upper lung. No pneumothorax. Upper Abdomen: Scattered calcified granulomas in the liver and spleen.  No acute abnormality of the visualized upper abdomen. Musculoskeletal: No chest wall mass or suspicious bone lesions identified. Vertebral body augmentation of the T11 and T12 vertebral bodies. Compression fracture of the superior endplate of the T4 vertebral body with less than 25% vertebral body height loss IMPRESSION: 1. No pulmonary mass as queried. 2. Trace bilateral pleural effusions with adjacent compressive atelectasis in the lung bases. 3. Age-indeterminate compression fracture of the T4 vertebral body superior endplate with less than 25% vertebral body height loss. 4. Aortic Atherosclerosis (ICD10-I70.0). Multivessel coronary artery disease. 5. Findings of prior granulomatous disease in the thorax and upper abdomen. 6. Subcentimeter incidental thyroid nodules. Not clinically significant; no follow-up imaging recommended (ref: J Am Coll Radiol. 2015 Feb;12(2): 143-50). Electronically Signed   By: Ileana Roup M.D.   On: 02/05/2021 15:21   ECHOCARDIOGRAM COMPLETE  Result Date: 02/06/2021    ECHOCARDIOGRAM REPORT   Patient Name:   Rebekah Bridges Date of Exam: 02/06/2021 Medical Rec #:  [id number]       Height:       59.0 in Accession #:    [id number]      Weight:       124.6 lb Date of Birth:  11-25-1915       BSA:          1.508 m Patient Age:    87 years       BP:           121/82 mmHg Patient Gender: F               HR:           76 bpm. Exam Location:  [facility] Procedure: 2D Echo, Cardiac Doppler and Color Doppler Indications:     Atrial Fibrillation I48.91                  CHF-acute systolic [id number]  History:         Patient has prior history of Echocardiogram examinations, most                  recent 10/20/2020. Paroxysmal Atrial Fibrillation.  Sonographer:     Sherrie Sport Referring Phys:  [id number] AMANDA TOBIN Diagnosing Phys: Kathlyn Sacramento MD  Sonographer Comments: No parasternal window, suboptimal apical window and Technically challenging study due to limited acoustic windows. IMPRESSIONS  1. Left  ventricular ejection fraction, by estimation, is 40 to 45%. The left ventricle has mildly decreased function. The left ventricle demonstrates regional wall motion abnormalities (see scoring diagram/findings for description). There is mild left ventricular hypertrophy. Left ventricular diastolic parameters are indeterminate. There is severe hypokinesis of the left ventricular, mid-apical anteroseptal wall, anterior wall and apical segment.  2. Right ventricular systolic function is normal. The right ventricular size is normal. There is normal pulmonary artery systolic pressure.  3. The mitral valve  is normal in structure. Mild mitral valve regurgitation. No evidence of mitral stenosis. Moderate mitral annular calcification.  4. The aortic valve is normal in structure. Aortic valve regurgitation is mild. Aortic valve sclerosis is present, with no evidence of aortic valve stenosis.  5. The inferior vena cava is normal in size with greater than 50% respiratory variability, suggesting right atrial pressure of 3 mmHg. FINDINGS  Left Ventricle: Left ventricular ejection fraction, by estimation, is 40 to 45%. The left ventricle has mildly decreased function. The left ventricle demonstrates regional wall motion abnormalities. Severe hypokinesis of the left ventricular, mid-apical  anteroseptal wall, anterior wall and apical segment. The left ventricular internal cavity size was normal in size. There is mild left ventricular hypertrophy. Left ventricular diastolic parameters are indeterminate. Right Ventricle: The right ventricular size is normal. No increase in right ventricular wall thickness. Right ventricular systolic function is normal. There is normal pulmonary artery systolic pressure. The tricuspid regurgitant velocity is 2.82 m/s, and  with an assumed right atrial pressure of 3 mmHg, the estimated right ventricular systolic pressure is [id number]. Left Atrium: Left atrial size was normal in size. Right Atrium: Right  atrial size was normal in size. Pericardium: There is no evidence of pericardial effusion. Mitral Valve: The mitral valve is normal in structure. Moderate mitral annular calcification. Mild mitral valve regurgitation. No evidence of mitral valve stenosis. MV peak gradient, 7.0 mmHg. The mean mitral valve gradient is 2.0 mmHg. Tricuspid Valve: The tricuspid valve is normal in structure. Tricuspid valve regurgitation is trivial. No evidence of tricuspid stenosis. Aortic Valve: The aortic valve is normal in structure. Aortic valve regurgitation is mild. Aortic valve sclerosis is present, with no evidence of aortic valve stenosis. Aortic valve mean gradient measures 2.0 mmHg. Aortic valve peak gradient measures 4.0  mmHg. Aortic valve area, by VTI measures 3.14 cm. Pulmonic Valve: The pulmonic valve was normal in structure. Pulmonic valve regurgitation is not visualized. No evidence of pulmonic stenosis. Aorta: The aortic root is normal in size and structure. Venous: The inferior vena cava is normal in size with greater than 50% respiratory variability, suggesting right atrial pressure of 3 mmHg. IAS/Shunts: No atrial level shunt detected by color flow Doppler.  LEFT VENTRICLE PLAX 2D LVIDd:         3.80 cm LVIDs:         2.30 cm LV PW:         1.10 cm LV IVS:        1.00 cm LVOT diam:     2.00 cm LV SV:         48 LV SV Index:   32 LVOT Area:     3.14 cm  RIGHT VENTRICLE RV Basal diam:  3.60 cm RV S prime:     11.30 cm/s TAPSE (M-mode): 3.1 cm LEFT ATRIUM           Index        RIGHT ATRIUM           Index LA diam:      3.80 cm 2.52 cm/m   RA Area:     15.50 cm LA Vol (A2C): 73.0 ml 48.40 ml/m  RA Volume:   42.30 ml  28.05 ml/m LA Vol (A4C): 17.9 ml 11.87 ml/m  AORTIC VALVE AV Area (Vmax):    2.75 cm AV Area (Vmean):   2.82 cm AV Area (VTI):     3.14 cm AV Vmax:  100.00 cm/s AV Vmean:          67.200 cm/s AV VTI:            0.153 m AV Peak Grad:      4.0 mmHg AV Mean Grad:      2.0 mmHg LVOT Vmax:          87.50 cm/s LVOT Vmean:        60.300 cm/s LVOT VTI:          0.153 m LVOT/AV VTI ratio: 1.00  AORTA Ao Root diam: 2.20 cm MITRAL VALVE                TRICUSPID VALVE MV Area (PHT): 5.50 cm     TR Peak grad:   31.8 mmHg MV Area VTI:   2.91 cm     TR Vmax:        282.00 cm/s MV Peak grad:  7.0 mmHg MV Mean grad:  2.0 mmHg     SHUNTS MV Vmax:       1.32 m/s     Systemic VTI:  0.15 m MV Vmean:      63.8 cm/s    Systemic Diam: 2.00 cm MV Decel Time: 138 msec MV E velocity: 104.00 cm/s MV A velocity: 47.10 cm/s MV E/A ratio:  2.21 Lorine Bears MD Electronically signed by Lorine Bears MD Signature Date/Time: 02/06/2021/3:18:02 PM    Final    ASSESSMENT AND PLAN:  87 year old female with history of hypertension, chronic combined heart failure, osteoporosis, paroxysmal A. fib, chronic respiratory failure on 2 L oxygen from assisted living facility presented to the ER with multiple complaints including burning urination, weakness and falling 2 days ago without any physical injury.  In the emergency room heart rate 110, afebrile.  100% on 2 L oxygen.  Sodium 127.  Urinalysis with likely infection.   Acute UTI present on admission: E. coli pansensitive.  Continue Rocephin due to ongoing temperature.  No evidence of urinary retention. -- No fever. Change to PO Keflex.   Hypochloremic hyponatremia: Likely related to low oral intake.  Also possible intravascular volume overload due to CHF. Sodium level slightly improving.  Received Lasix.  Supportive treatment. -- Sodium continues to decrease. Today 126. -- Nephrology consultation with Dr. Cherylann Ratel. Recommends fluid at low rate. --12/19--na 127 creat to normal. Encourage patient to continue hydration --12/20-- Na 129. Cont IVF. Per dr Cherylann Ratel ok to d/c once na >130. Pt hemodynamically stable  Paroxysmal A. fib: Atrial fibrillation. Seen by cardiology.  Started on anticoagulation with Eliquis.   Suspected acute on chronic combined heart failure  exacerbation: Chronic hypoxemic respiratory failure. --Mildly elevated troponins without chest pain.  EKG nonischemic. --Known ejection fraction 45 to 50% with mildly reduced ventricular ejection fraction. --Patient is fairly compensated and euvolemic. --Patient is already on heart failure therapy with Lasix 40 mg daily at home, metoprolol and nitrates.  --She uses 2 L oxygen at home and saturating 100%. --Mildly elevated troponins are likely secondary to demand ischemia.  No evidence of acute coronary syndrome.   --On  statin, beta-blockers and nitrates. -- Discontinue aspirin as starting on Eliquis.  Generalized weakness -- PT OT-- recommends home health  Family communication : Roselyn Bering on the phone Consults : cardiology, nephrology CODE STATUS: DNR DVT Prophylaxis : eliquis Level of care: Telemetry Cardiac Status is: Inpatient  Remains inpatient appropriate because: low sodium  if continues to show improvement patient will return back to her ALF once NA >130 This  was discussed with family.      TOTAL TIME TAKING CARE OF THIS PATIENT: 25 minutes.  >50% time spent on counselling and coordination of care  Note: This dictation was prepared with Dragon dictation along with smaller phrase technology. Any transcriptional errors that result from this process are unintentional.  Fritzi Mandes M.D    [facility]   CC: Primary care physician; System, Provider Not In Patient ID: Rebekah Bridges, female   DOB: 06-Oct-1915, 87 y.o.   MRN: [id number]

## 2021-02-07 NOTE — Progress Notes (Signed)
Mobility Specialist - Progress Note   02/07/21 1400  Mobility  Activity Ambulated in room;Transferred:  Chair to bed  Level of Assistance Standby assist, set-up cues, supervision of patient - no hands on  Assistive Device Front wheel walker  Distance Ambulated (ft) 25 ft  Mobility Ambulated with assistance in room  Mobility Response Tolerated well  Mobility performed by Mobility specialist  $Mobility charge 1 Mobility    Pt sitting in recliner upon arrival, utilizing RA. MinA to stand from chair, ambulated in room with supervision. Does voice weakness and stiffness in LE. Pt returned to bed with assist for LE support. Alarm set.    Rebekah Bridges Mobility Specialist 02/07/21, 2:47 PM

## 2021-02-07 NOTE — Progress Notes (Signed)
[facility]  ROUNDING NOTE   Subjective:   Patient seen resting in bed, awaiting breakfast tray Denies nausea and vomiting Denies shortness of breath  Sodium improved to 129  Objective:  Vital signs in last 24 hours:  Temp:  [97.6 F (36.4 C)-98.3 F (36.8 C)] 98.1 F (36.7 C) (12/20 0804) Pulse Rate:  [76-85] 84 (12/20 0804) Resp:  [18] 18 (12/20 0804) BP: (117-136)/(61-89) 117/76 (12/20 0804) SpO2:  [95 %-100 %] 95 % (12/20 0804) Weight:  [56.6 kg] 56.6 kg (12/20 0413)  Weight change: 0.1 kg Filed Weights   02/05/21 0435 02/06/21 0604 02/07/21 0413  Weight: 55 kg 56.5 kg 56.6 kg    Intake/Output: I/O last 3 completed shifts: In: 2275.4 [P.O.:480; I.V.:1795.4] Out: 1250 [Urine:1250]   Intake/Output this shift:  Total I/O In: 390.8 [P.O.:240; I.V.:150.8] Out: -   Physical Exam: General: NAD, sitting in chair  Head: Normocephalic, atraumatic. Moist oral mucosal membranes  Eyes: Anicteric  Lungs:  Clear to auscultation, normal effort  Heart: Regular rate and rhythm  Abdomen:  Soft, nontender  Extremities:  no peripheral edema.  Neurologic: Nonfocal, moving all four extremities  Skin: No lesions       Basic Metabolic Panel: Recent Labs  Lab 02/03/21 0636 02/04/21 0435 02/05/21 0534 02/06/21 0613 02/07/21 0630  NA 129* 128* 126* 127* 129*  K 3.6 3.8 3.7 3.9 3.7  CL 94* 92* 94* 97* 101  CO2 26 27 26 24 23   GLUCOSE 112* 96 103* 92 103*  BUN 34* 37* 36* 28* 23  CREATININE 1.26* 1.10* 1.20* 0.81 0.71  CALCIUM 8.2* 7.9* 7.7* 7.6* 7.2*  MG 2.0  --   --   --   --   PHOS 3.6  --   --   --   --      Liver Function Tests: Recent Labs  Lab 02/02/21 1551  AST 50*  ALT 15  ALKPHOS 64  BILITOT 1.1  PROT 7.5  ALBUMIN 3.6    No results for input(s): LIPASE, AMYLASE in the last 168 hours. Recent Labs  Lab 02/02/21 1551  AMMONIA <10     CBC: Recent Labs  Lab 02/02/21 1551  WBC 9.3  NEUTROABS 8.2*  HGB 11.3*  HCT 35.0*  MCV  93.6  PLT 126*     Cardiac Enzymes: No results for input(s): CKTOTAL, CKMB, CKMBINDEX, TROPONINI in the last 168 hours.  BNP: Invalid input(s): POCBNP  CBG: No results for input(s): GLUCAP in the last 168 hours.  Microbiology: Results for orders placed or performed during the hospital encounter of 02/02/21  Resp Panel by RT-PCR (Flu A&B, Covid) Nasopharyngeal Swab     Status: None   Collection Time: 02/02/21  3:51 PM   Specimen: Nasopharyngeal Swab; Nasopharyngeal(NP) swabs in vial transport medium  Result Value Ref Range Status   SARS Coronavirus 2 by RT PCR NEGATIVE NEGATIVE Final    Comment: (NOTE) SARS-CoV-2 target nucleic acids are NOT DETECTED.  The SARS-CoV-2 RNA is generally detectable in upper respiratory specimens during the acute phase of infection. The lowest concentration of SARS-CoV-2 viral copies this assay can detect is 138 copies/mL. A negative result does not preclude SARS-Cov-2 infection and should not be used as the sole basis for treatment or other patient management decisions. A negative result may occur with  improper specimen collection/handling, submission of specimen other than nasopharyngeal swab, presence of viral mutation(s) within the areas targeted by this assay, and inadequate number of viral copies(<138 copies/mL). A negative  result must be combined with clinical observations, patient history, and epidemiological information. The expected result is Negative.  Fact Sheet for Patients:  EntrepreneurPulse.com.au  Fact Sheet for Healthcare Providers:  IncredibleEmployment.be  This test is no t yet approved or cleared by the [place] FDA and  has been authorized for detection and/or diagnosis of SARS-CoV-2 by FDA under an Emergency Use Authorization (EUA). This EUA will remain  in effect (meaning this test can be used) for the duration of the COVID-19 declaration under Section 564(b)(1) of the Act,  21 U.S.C.section 360bbb-3(b)(1), unless the authorization is terminated  or revoked sooner.       Influenza A by PCR NEGATIVE NEGATIVE Final   Influenza B by PCR NEGATIVE NEGATIVE Final    Comment: (NOTE) The Xpert Xpress SARS-CoV-2/FLU/RSV plus assay is intended as an aid in the diagnosis of influenza from Nasopharyngeal swab specimens and should not be used as a sole basis for treatment. Nasal washings and aspirates are unacceptable for Xpert Xpress SARS-CoV-2/FLU/RSV testing.  Fact Sheet for Patients: EntrepreneurPulse.com.au  Fact Sheet for Healthcare Providers: IncredibleEmployment.be  This test is not yet approved or cleared by the [place] FDA and has been authorized for detection and/or diagnosis of SARS-CoV-2 by FDA under an Emergency Use Authorization (EUA). This EUA will remain in effect (meaning this test can be used) for the duration of the COVID-19 declaration under Section 564(b)(1) of the Act, 21 U.S.C. section 360bbb-3(b)(1), unless the authorization is terminated or revoked.  Performed at [facility], [address], [place], [place] [address]   Urine Culture     Status: Abnormal   Collection Time: 02/02/21  5:13 PM   Specimen: Urine, Random  Result Value Ref Range Status   Specimen Description   Final    URINE, RANDOM Performed at [facility], [address] [place], [place] [address]    Special Requests   Final    NONE Performed at [facility], [place], [place] [address]    Culture >=100,000 COLONIES/mL ESCHERICHIA COLI (A)  Final   Report Status 02/04/2021 FINAL  Final   Organism ID, Bacteria ESCHERICHIA COLI (A)  Final      Susceptibility   Escherichia coli - MIC*    AMPICILLIN <=2 SENSITIVE Sensitive     CEFAZOLIN <=4 SENSITIVE Sensitive     CEFEPIME <=0.12 SENSITIVE Sensitive     CEFTRIAXONE <=0.25 SENSITIVE Sensitive     CIPROFLOXACIN >=4 RESISTANT  Resistant     GENTAMICIN <=1 SENSITIVE Sensitive     IMIPENEM <=0.25 SENSITIVE Sensitive     NITROFURANTOIN <=16 SENSITIVE Sensitive     TRIMETH/SULFA >=320 RESISTANT Resistant     AMPICILLIN/SULBACTAM <=2 SENSITIVE Sensitive     PIP/TAZO <=4 SENSITIVE Sensitive     * >=100,000 COLONIES/mL ESCHERICHIA COLI    Coagulation Studies: No results for input(s): LABPROT, INR in the last 72 hours.  Urinalysis: No results for input(s): COLORURINE, LABSPEC, PHURINE, GLUCOSEU, HGBUR, BILIRUBINUR, KETONESUR, PROTEINUR, UROBILINOGEN, NITRITE, LEUKOCYTESUR in the last 72 hours.  Invalid input(s): APPERANCEUR    Imaging: CT CHEST WO CONTRAST  Result Date: 02/05/2021 CLINICAL DATA:  Rule out lung mass. EXAM: CT CHEST WITHOUT CONTRAST TECHNIQUE: Multidetector CT imaging of the chest was performed following the standard protocol without IV contrast. COMPARISON:  Chest radiograph 02/02/2021 FINDINGS: Cardiovascular: No significant vascular findings. Scattered aortic calcifications. Multivessel coronary artery calcification. Normal heart size. No pericardial effusion. Mediastinum/Nodes: There are few calcified mediastinal lymph nodes. No mediastinal, hilar,  or axillary lymphadenopathy. Two hypodense subcentimeter nodules are noted in the mid right and lower left thyroid (series 2, image 9, 17). No abnormality of the trachea or esophagus. Lungs/Pleura: Trace bilateral pleural effusions with adjacent compressive atelectasis at the lung bases. Calcified granulomas in the left upper lung. No pneumothorax. Upper Abdomen: Scattered calcified granulomas in the liver and spleen. No acute abnormality of the visualized upper abdomen. Musculoskeletal: No chest wall mass or suspicious bone lesions identified. Vertebral body augmentation of the T11 and T12 vertebral bodies. Compression fracture of the superior endplate of the T4 vertebral body with less than 25% vertebral body height loss IMPRESSION: 1. No pulmonary mass as  queried. 2. Trace bilateral pleural effusions with adjacent compressive atelectasis in the lung bases. 3. Age-indeterminate compression fracture of the T4 vertebral body superior endplate with less than 25% vertebral body height loss. 4. Aortic Atherosclerosis (ICD10-I70.0). Multivessel coronary artery disease. 5. Findings of prior granulomatous disease in the thorax and upper abdomen. 6. Subcentimeter incidental thyroid nodules. Not clinically significant; no follow-up imaging recommended (ref: J Am Coll Radiol. 2015 Feb;12(2): 143-50). Electronically Signed   By: Sherron Ales M.D.   On: 02/05/2021 15:21   ECHOCARDIOGRAM COMPLETE  Result Date: 02/06/2021    ECHOCARDIOGRAM REPORT   Patient Name:   Rebekah Bridges Date of Exam: 02/06/2021 Medical Rec #:  [id number]       Height:       59.0 in Accession #:    [id number]      Weight:       124.6 lb Date of Birth:  September 18, 1915       BSA:          1.508 m Patient Age:    89 years       BP:           121/82 mmHg Patient Gender: F               HR:           76 bpm. Exam Location:  [facility] Procedure: 2D Echo, Cardiac Doppler and Color Doppler Indications:     Atrial Fibrillation I48.91                  CHF-acute systolic I50.21  History:         Patient has prior history of Echocardiogram examinations, most                  recent 10/20/2020. Paroxysmal Atrial Fibrillation.  Sonographer:     Cristela Blue Referring Phys:  [id number] AMANDA TOBIN Diagnosing Phys: Lorine Bears MD  Sonographer Comments: No parasternal window, suboptimal apical window and Technically challenging study due to limited acoustic windows. IMPRESSIONS  1. Left ventricular ejection fraction, by estimation, is 40 to 45%. The left ventricle has mildly decreased function. The left ventricle demonstrates regional wall motion abnormalities (see scoring diagram/findings for description). There is mild left ventricular hypertrophy. Left ventricular diastolic parameters are indeterminate. There is severe  hypokinesis of the left ventricular, mid-apical anteroseptal wall, anterior wall and apical segment.  2. Right ventricular systolic function is normal. The right ventricular size is normal. There is normal pulmonary artery systolic pressure.  3. The mitral valve is normal in structure. Mild mitral valve regurgitation. No evidence of mitral stenosis. Moderate mitral annular calcification.  4. The aortic valve is normal in structure. Aortic valve regurgitation is mild. Aortic valve sclerosis is present, with no evidence of aortic valve stenosis.  5. The inferior vena  cava is normal in size with greater than 50% respiratory variability, suggesting right atrial pressure of 3 mmHg. FINDINGS  Left Ventricle: Left ventricular ejection fraction, by estimation, is 40 to 45%. The left ventricle has mildly decreased function. The left ventricle demonstrates regional wall motion abnormalities. Severe hypokinesis of the left ventricular, mid-apical  anteroseptal wall, anterior wall and apical segment. The left ventricular internal cavity size was normal in size. There is mild left ventricular hypertrophy. Left ventricular diastolic parameters are indeterminate. Right Ventricle: The right ventricular size is normal. No increase in right ventricular wall thickness. Right ventricular systolic function is normal. There is normal pulmonary artery systolic pressure. The tricuspid regurgitant velocity is 2.82 m/s, and  with an assumed right atrial pressure of 3 mmHg, the estimated right ventricular systolic pressure is [id number]. Left Atrium: Left atrial size was normal in size. Right Atrium: Right atrial size was normal in size. Pericardium: There is no evidence of pericardial effusion. Mitral Valve: The mitral valve is normal in structure. Moderate mitral annular calcification. Mild mitral valve regurgitation. No evidence of mitral valve stenosis. MV peak gradient, 7.0 mmHg. The mean mitral valve gradient is 2.0 mmHg. Tricuspid  Valve: The tricuspid valve is normal in structure. Tricuspid valve regurgitation is trivial. No evidence of tricuspid stenosis. Aortic Valve: The aortic valve is normal in structure. Aortic valve regurgitation is mild. Aortic valve sclerosis is present, with no evidence of aortic valve stenosis. Aortic valve mean gradient measures 2.0 mmHg. Aortic valve peak gradient measures 4.0  mmHg. Aortic valve area, by VTI measures 3.14 cm. Pulmonic Valve: The pulmonic valve was normal in structure. Pulmonic valve regurgitation is not visualized. No evidence of pulmonic stenosis. Aorta: The aortic root is normal in size and structure. Venous: The inferior vena cava is normal in size with greater than 50% respiratory variability, suggesting right atrial pressure of 3 mmHg. IAS/Shunts: No atrial level shunt detected by color flow Doppler.  LEFT VENTRICLE PLAX 2D LVIDd:         3.80 cm LVIDs:         2.30 cm LV PW:         1.10 cm LV IVS:        1.00 cm LVOT diam:     2.00 cm LV SV:         48 LV SV Index:   32 LVOT Area:     3.14 cm  RIGHT VENTRICLE RV Basal diam:  3.60 cm RV S prime:     11.30 cm/s TAPSE (M-mode): 3.1 cm LEFT ATRIUM           Index        RIGHT ATRIUM           Index LA diam:      3.80 cm 2.52 cm/m   RA Area:     15.50 cm LA Vol (A2C): 73.0 ml 48.40 ml/m  RA Volume:   42.30 ml  28.05 ml/m LA Vol (A4C): 17.9 ml 11.87 ml/m  AORTIC VALVE AV Area (Vmax):    2.75 cm AV Area (Vmean):   2.82 cm AV Area (VTI):     3.14 cm AV Vmax:           100.00 cm/s AV Vmean:          67.200 cm/s AV VTI:            0.153 m AV Peak Grad:      4.0 mmHg AV Mean Grad:  2.0 mmHg LVOT Vmax:         87.50 cm/s LVOT Vmean:        60.300 cm/s LVOT VTI:          0.153 m LVOT/AV VTI ratio: 1.00  AORTA Ao Root diam: 2.20 cm MITRAL VALVE                TRICUSPID VALVE MV Area (PHT): 5.50 cm     TR Peak grad:   31.8 mmHg MV Area VTI:   2.91 cm     TR Vmax:        282.00 cm/s MV Peak grad:  7.0 mmHg MV Mean grad:  2.0 mmHg      SHUNTS MV Vmax:       1.32 m/s     Systemic VTI:  0.15 m MV Vmean:      63.8 cm/s    Systemic Diam: 2.00 cm MV Decel Time: 138 msec MV E velocity: 104.00 cm/s MV A velocity: 47.10 cm/s MV E/A ratio:  2.21 Kathlyn Sacramento MD Electronically signed by Kathlyn Sacramento MD Signature Date/Time: 02/06/2021/3:18:02 PM    Final      Medications:    sodium chloride     sodium chloride 50 mL/hr at 02/07/21 0801    apixaban  2.5 mg Oral BID   atorvastatin  80 mg Oral Daily   cephALEXin  500 mg Oral BID   isosorbide mononitrate  15 mg Oral Daily   metoprolol tartrate  50 mg Oral BID   sodium chloride flush  3 mL Intravenous Q12H   sodium chloride, acetaminophen, acetaminophen, ondansetron (ZOFRAN) IV, sodium chloride flush  Assessment/ Plan:  Rebekah Bridges is a 87 y.o.  female with past medical history of CHF, hypertension, paroxysmal A. fib, hypertension, who was admitted to [facility] on 02/02/2021 for CHF exacerbation ([place]) [I50.9] Acute cystitis without hematuria [N30.00] Troponin I above reference range [R77.8] Longstanding persistent atrial fibrillation ([place]) [I48.11] Acute on chronic congestive heart failure, unspecified heart failure type ([place]) [I50.9] Dementia, unspecified dementia severity, unspecified dementia type, unspecified whether behavioral, psychotic, or mood disturbance or anxiety ([place]) [F03.90]   Hyponatremia. Appears to have been normal in September. Continue to hold diuretic. CT chest negative for lung mass.  -Sodium improved to 129.  Continue IV fluids for another 24 hours.  Desired goal 130 before consideration of discharge..  2.  Acute Kidney Injury with baseline creatinine 0.86 and GFR of 59 on 02/02/21.  Acute kidney injury secondary to diuresis No acute indication for dialysis. No exposure to IV contrast.  Creatinine returned to baseline. Continue to hold diuretics at this time.   Lab Results  Component Value Date   CREATININE 0.71 02/07/2021   CREATININE 0.81  02/06/2021   CREATININE 1.20 (H) 02/05/2021    Intake/Output Summary (Last 24 hours) at 02/07/2021 1044 Last data filed at 02/07/2021 0805 Gross per 24 hour  Intake 2186.23 ml  Output 1250 ml  Net 936.23 ml        LOS: 5   12/20/202210:44 AM

## 2021-02-08 DIAGNOSIS — I739 Peripheral vascular disease, unspecified: Secondary | ICD-10-CM

## 2021-02-08 LAB — BASIC METABOLIC PANEL
Anion gap: 4 — ABNORMAL LOW (ref 5–15)
BUN: 16 mg/dL (ref 8–23)
CO2: 25 mmol/L (ref 22–32)
Calcium: 7.4 mg/dL — ABNORMAL LOW (ref 8.9–10.3)
Chloride: 102 mmol/L (ref 98–111)
Creatinine, Ser: 0.64 mg/dL (ref 0.44–1.00)
GFR, Estimated: >60 mL/min (ref >60)
Glucose, Bld: 131 mg/dL — ABNORMAL HIGH (ref 70–99)
Potassium: 3.4 mmol/L — ABNORMAL LOW (ref 3.5–5.1)
Sodium: 131 mmol/L — ABNORMAL LOW (ref 135–145)

## 2021-02-08 MED ORDER — APIXABAN 2.5 MG PO TABS
2.5000 mg | ORAL_TABLET | Freq: Two times a day (BID) | ORAL | 2 refills | Status: AC
Start: 2021-02-08 — End: 2021-05-09

## 2021-02-08 MED ORDER — METOPROLOL TARTRATE 50 MG PO TABS: 50.0000 mg | ORAL_TABLET | Freq: Two times a day (BID) | ORAL | 2 refills | Status: AC

## 2021-02-08 MED ORDER — POTASSIUM CHLORIDE CRYS ER 20 MEQ PO TBCR
40.0000 meq | EXTENDED_RELEASE_TABLET | Freq: Once | ORAL | Status: AC
  Administered 2021-02-08: 10:00:00 40 meq via ORAL
  Filled 2021-02-08: qty 2

## 2021-02-08 NOTE — Progress Notes (Signed)
Patient awaiting for EMS to arrive for discharge. VSS, no complaints of pain. PIV x 1 removed, no bleeding, intact. Report called to Misty Stanley, all questions answered. Will continue to monitor and service while waiting for transport.

## 2021-02-08 NOTE — Progress Notes (Signed)
Report given to [place] from [facility] via phone at this time. All questions answered.

## 2021-02-08 NOTE — TOC Transition Note (Signed)
Transition of Care [facility]) - CM/SW Discharge Note   Patient Details  Name: Rebekah Bridges MRN: [id number] Date of Birth: 03-28-1915  Transition of Care [facility]) CM/SW Contact:  Gildardo Griffes, LCSW Phone Number: 02/08/2021, 2:15 PM   Clinical Narrative:     Per [name] at [facility] at [phone] patient can return today, clinicals faxed to [phone] Attn Tiffany or Misty Stanley. CSW has also informed [facility] of patient's dc today.    Patient will DC to: [facility] Anticipated DC date: 02/08/21 Family notified: daughter [name] Transport byWendie Simmer  Per MD patient ready for DC to [facility]. RN, patient, patient's family, and facility notified of DC. Discharge Summary sent to facility. RN given number for report  [phone]. DC packet on chart. Ambulance transport requested for patient.  CSW signing off.  Angeline Slim, LCSW    Final next level of care: Assisted Living Barriers to Discharge: No Barriers Identified   Patient Goals and CMS Choice Patient states their goals for this hospitalization and ongoing recovery are:: to go home CMS Medicare.gov Compare Post Acute Care list provided to:: Patient Represenative (must comment) (daughter) Choice offered to / list presented to : Adult Children  Discharge Placement              Patient chooses bed at:  [facility]) Patient to be transferred to facility by: [facility] Name of family member notified: daughter Patient and family notified of of transfer: 02/08/21  Discharge Plan and Services                          HH Arranged: PT, OT [facility] Agency: [facility] Date HH Agency Contacted: 02/08/21 Time HH Agency Contacted: 1415 Representative spoke with at [facility] Agency: Barbara Cower  Social Determinants of Health (SDOH) Interventions     Readmission Risk Interventions Readmission Risk Prevention Plan 06/30/2020  Post Dischage Appt Complete  Medication Screening Complete  Transportation Screening  Complete  Some recent data might be hidden

## 2021-02-08 NOTE — Plan of Care (Signed)
Problem: Education: Goal: Knowledge of General Education information will improve Description: Including pain rating scale, medication(s)/side effects and non-pharmacologic comfort measures 02/08/2021 1552 by Orvan Seen, RN Outcome: Completed/Met 02/08/2021 0833 by Orvan Seen, RN Outcome: Progressing   Problem: Health Behavior/Discharge Planning: Goal: Ability to manage health-related needs will improve 02/08/2021 1552 by Orvan Seen, RN Outcome: Completed/Met 02/08/2021 0833 by Orvan Seen, RN Outcome: Progressing   Problem: Clinical Measurements: Goal: Ability to maintain clinical measurements within normal limits will improve 02/08/2021 1552 by Orvan Seen, RN Outcome: Completed/Met 02/08/2021 0833 by Orvan Seen, RN Outcome: Progressing Goal: Will remain free from infection 02/08/2021 1552 by Orvan Seen, RN Outcome: Completed/Met 02/08/2021 0833 by Orvan Seen, RN Outcome: Progressing Goal: Diagnostic test results will improve 02/08/2021 1552 by Orvan Seen, RN Outcome: Completed/Met 02/08/2021 0833 by Orvan Seen, RN Outcome: Progressing Goal: Respiratory complications will improve 02/08/2021 1552 by Orvan Seen, RN Outcome: Completed/Met 02/08/2021 0833 by Orvan Seen, RN Outcome: Progressing Goal: Cardiovascular complication will be avoided 02/08/2021 1552 by Orvan Seen, RN Outcome: Completed/Met 02/08/2021 0833 by Orvan Seen, RN Outcome: Progressing   Problem: Activity: Goal: Risk for activity intolerance will decrease 02/08/2021 1552 by Orvan Seen, RN Outcome: Completed/Met 02/08/2021 4097 by Orvan Seen, RN Outcome: Progressing   Problem: Nutrition: Goal: Adequate nutrition will be maintained 02/08/2021 1552 by Orvan Seen, RN Outcome: Completed/Met 02/08/2021 0833 by Orvan Seen, RN Outcome: Progressing   Problem: Coping: Goal: Level of anxiety will  decrease 02/08/2021 1552 by Orvan Seen, RN Outcome: Completed/Met 02/08/2021 0833 by Orvan Seen, RN Outcome: Progressing   Problem: Elimination: Goal: Will not experience complications related to bowel motility 02/08/2021 1552 by Orvan Seen, RN Outcome: Completed/Met 02/08/2021 0833 by Orvan Seen, RN Outcome: Progressing Goal: Will not experience complications related to urinary retention 02/08/2021 1552 by Orvan Seen, RN Outcome: Completed/Met 02/08/2021 0833 by Orvan Seen, RN Outcome: Progressing   Problem: Pain Managment: Goal: General experience of comfort will improve 02/08/2021 1552 by Orvan Seen, RN Outcome: Completed/Met 02/08/2021 0833 by Orvan Seen, RN Outcome: Progressing   Problem: Safety: Goal: Ability to remain free from injury will improve 02/08/2021 1552 by Orvan Seen, RN Outcome: Completed/Met 02/08/2021 0833 by Orvan Seen, RN Outcome: Progressing   Problem: Skin Integrity: Goal: Risk for impaired skin integrity will decrease 02/08/2021 1552 by Orvan Seen, RN Outcome: Completed/Met 02/08/2021 0833 by Orvan Seen, RN Outcome: Progressing

## 2021-02-08 NOTE — NC FL2 (Signed)
[place] LEVEL OF CARE SCREENING TOOL     IDENTIFICATION  Patient Name: Rebekah Bridges Birthdate: 1915-08-30 Sex: female Admission Date (Current Location): 02/02/2021  [place] and [place] Number:  Engineering geologist and Address:  [facility], [address] [place], [place] [address]      Provider Number: [id number]  Attending Physician Name and Address:  [place] Eric J, DO  Relative Name and Phone Number:  Pamala Hurry (daughter) [phone]    Current Level of Care: Hospital Recommended Level of Care: [place] Prior Approval Number:    Date Approved/Denied:   PASRR Number:    Discharge Plan: Other (Comment) [facility])    Current Diagnoses: Patient Active Problem List   Diagnosis Date Noted   Acute cystitis without hematuria    Dementia ([place])    Hyponatremia    CHF exacerbation ([place]) 02/02/2021   NSTEMI (non-ST elevated myocardial infarction) ([place]) 10/20/2020   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)    Hallux valgus with bunions of right foot    Acute pancreatitis 07/17/2019   Unspecified atrial fibrillation ([place]) 07/17/2019   Peripheral polyneuropathy 01/23/2019   Bilateral sacroiliitis ([place]) 11/05/2018   Body mass index (BMI) 26.0-26.9, adult 11/05/2018   Elevated blood-pressure reading, without diagnosis of hypertension 11/05/2018   Chronic low back pain without sciatica 01/07/2015   Degeneration of lumbar intervertebral disc 03/16/2014   Spinal stenosis of lumbar region 03/03/2014   Essential hypertension 02/06/2014   Back pain 01/30/2014   Neuropathy 01/30/2014   PAD (peripheral artery disease) ([place]) 08/28/2012   History of headache 08/28/2012   HYPERLIPIDEMIA 01/24/2010   Osteoarthritis 01/24/2010   BACK PAIN 01/24/2010   Osteoporosis 01/24/2010    Orientation RESPIRATION BLADDER Height & Weight     Self, Time, Situation, Place  Normal Incontinent, External  catheter Weight: 129 lb 3 oz (58.6 kg) Height:  4\' 11"  (149.9 cm)  BEHAVIORAL SYMPTOMS/MOOD NEUROLOGICAL BOWEL NUTRITION STATUS      Continent Diet (heart healthy)  AMBULATORY STATUS COMMUNICATION OF NEEDS Skin   Limited Assist Verbally Normal                       Personal Care Assistance Level of Assistance  Feeding, Total care, Bathing, Dressing Bathing Assistance: Independent Feeding assistance: Independent Dressing Assistance: Independent Total Care Assistance: Limited assistance   Functional Limitations Info  Sight, Hearing, Speech Sight Info: Adequate Hearing Info: Impaired Speech Info: Adequate    SPECIAL CARE FACTORS FREQUENCY  PT (By licensed PT), OT (By licensed OT)     PT Frequency: home health OT Frequency: home health            Contractures Contractures Info: Not present    Additional Factors Info  Code Status, Allergies Code Status Info: DNR Allergies Info: Hydrocodone   Meloxicam   Other   Tramadol Hcl   Penicillins   Sulfa Antibiotics           Current Medications (02/08/2021):      Discharge Medications: acetaminophen 325 MG tablet Commonly known as: TYLENOL Take 650 mg by mouth every 6 (six) hours as needed.    apixaban 2.5 MG Tabs tablet Commonly known as: ELIQUIS Take 1 tablet (2.5 mg total) by mouth 2 (two) times daily.    Aspirin Low Dose 81 MG EC tablet Generic drug: aspirin Take 1 tablet (81 mg total) by mouth daily. Swallow whole. What changed: when to take this  atorvastatin 80 MG tablet Commonly known as: LIPITOR Take 1 tablet (80 mg total) by mouth daily.    beta carotene 45625 UNIT capsule Take 25,000 Units by mouth at bedtime.    calcium carbonate 500 MG chewable tablet Commonly known as: TUMS - dosed in mg elemental calcium Chew 1 tablet by mouth 3 (three) times daily as needed for indigestion or heartburn.    cholecalciferol 25 MCG (1000 UNIT) tablet Commonly known as: VITAMIN D Take 1,000 Units by  mouth at bedtime.    ibuprofen 200 MG tablet Commonly known as: ADVIL Take 200 mg by mouth every 8 (eight) hours as needed (back/knee pain).    isosorbide mononitrate 30 MG 24 hr tablet Commonly known as: IMDUR Take 0.5 tablets (15 mg total) by mouth daily.    metoprolol tartrate 50 MG tablet Commonly known as: LOPRESSOR Take 1 tablet (50 mg total) by mouth 2 (two) times daily. What changed:  medication strength how much to take    multivitamin with minerals Tabs tablet Take 1 tablet by mouth at bedtime. One-A-Day Multivitamin           Relevant Imaging Results:  Relevant Lab Results:   Additional Information SSN: [id number]  Gildardo Griffes, LCSW

## 2021-02-08 NOTE — Plan of Care (Signed)

## 2021-02-08 NOTE — Progress Notes (Signed)
Attempted to call [phone] for report to [facility] at this time. No answer. Will try again in 10 minutes.

## 2021-02-08 NOTE — Progress Notes (Signed)
Called EMS to inquire about patient pick up status. EMS reported that patient is on cue and there are two people ahead of patient. Will continue to monitor and service.

## 2021-02-08 NOTE — Progress Notes (Signed)
Occupational Therapy Treatment Patient Details Name: Rebekah Bridges MRN: [id number] DOB: 04-02-1915 Today's Date: 02/08/2021   History of present illness Rebekah Bridges is a 87 y/o female w/ PMH: afib, osteoporosis, HTN, CHF and chronic hypoxic respiratory failure on 2Lnc PRN. She presented to ED via EMS from [facility] d/t concerns for low oxygen saturations; admitted for mangement of acute/chronic respiratory failure secondary to CHF exacerbation.   OT comments  Pt seen for OT treatment on this date. Upon arrival to room, pt awake and seated upright in recliner. Pt alert and oriented to self, place, month/year, and some aspects of situation. Pt agreeable to tx. Pt currently requires SUPERVISION for functional mobility of short household distances (~30 ft) with RW, SUPERVISION-MIN GUARD for UB/LB dressing, MIN GUARD for toilet transfers/hygiene, and SUPERVISION for standing grooming tasks d/t decreased activity tolerance and decreased balance. Pt is making good progress toward goals and continues to benefit from skilled OT services to maximize return to PLOF and minimize risk of future falls, injury, caregiver burden, and readmission. Will continue to follow POC. Discharge recommendation remains appropriate.     Recommendations for follow up therapy are one component of a multi-disciplinary discharge planning process, led by the attending physician.  Recommendations may be updated based on patient status, additional functional criteria and insurance authorization.    Follow Up Recommendations  Home health OT    Assistance Recommended at Discharge Intermittent Supervision/Assistance  Equipment Recommendations  BSC/3in1;Tub/shower seat       Precautions / Restrictions Precautions Precautions: Fall Restrictions Weight Bearing Restrictions: No       Mobility Bed Mobility               General bed mobility comments: In recliner beginning/end of treatment session     Transfers Overall transfer level: Needs assistance Equipment used: Rolling walker (2 wheels) Transfers: Sit to/from Stand Sit to Stand: Min guard;Supervision           General transfer comment: cuing for hand placement, tends to pull on RW despite cues.     Balance Overall balance assessment: Needs assistance Sitting-balance support: No upper extremity supported;Feet supported Sitting balance-Leahy Scale: Good Sitting balance - Comments: good sitting balance reaching outside BOS to don/doff underwear   Standing balance support: No upper extremity supported;During functional activity Standing balance-Leahy Scale: Fair Standing balance comment: Requires MIN GUARD for standing hand hygiene                           ADL either performed or assessed with clinical judgement   ADL Overall ADL's : Needs assistance/impaired     Grooming: Wash/dry hands;Supervision/safety;Standing           Upper Body Dressing : Supervision/safety;Set up;Sitting Upper Body Dressing Details (indicate cue type and reason): to don/doff hospital gown Lower Body Dressing: Min guard;Sit to/from stand Lower Body Dressing Details (indicate cue type and reason): to don/doff underwear. Pt with some difficulty threading over toes however no physical assist requires Toilet Transfer: Min guard;Ambulation;Regular Toilet;Rolling walker (2 wheels) Toilet Transfer Details (indicate cue type and reason): Requires verbal cues for safe hand placement with RW use Toileting- Clothing Manipulation and Hygiene: Supervision/safety;Sitting/lateral lean       Functional mobility during ADLs: Supervision/safety;Rolling walker (2 wheels)        Cognition Arousal/Alertness: Awake/alert Behavior During Therapy: WFL for tasks assessed/performed Overall Cognitive Status: Within Functional Limits for tasks assessed  General Comments: Pt pleasant and agreeable  throughout. Some decreased short term memory (ex: forgot purewick was removed and urinated while standing). She is oriented to self, place, month/year, and some aspects of situation                     Pertinent Vitals/ Pain       Pain Assessment: No/denies pain         Frequency  Min 2X/week        Progress Toward Goals  OT Goals(current goals can now be found in the care plan section)  Progress towards OT goals: Progressing toward goals  Acute Rehab OT Goals Patient Stated Goal: to regain strength OT Goal Formulation: With patient Time For Goal Achievement: 02/17/21 Potential to Achieve Goals: Good  Plan Discharge plan remains appropriate;Frequency remains appropriate       AM-PAC OT "6 Clicks" Daily Activity     Outcome Measure   Help from another person eating meals?: None Help from another person taking care of personal grooming?: A Little Help from another person toileting, which includes using toliet, bedpan, or urinal?: A Little Help from another person bathing (including washing, rinsing, drying)?: A Lot Help from another person to put on and taking off regular upper body clothing?: None Help from another person to put on and taking off regular lower body clothing?: A Little 6 Click Score: 19    End of Session Equipment Utilized During Treatment: Rolling walker (2 wheels)  OT Visit Diagnosis: Muscle weakness (generalized) (M62.81)   Activity Tolerance Patient tolerated treatment well   Patient Left in chair;with call bell/phone within reach;with chair alarm set   Nurse Communication Mobility status        Time: 6333-5456 OT Time Calculation (min): 27 min  Charges: OT General Charges $OT Visit: 1 Visit OT Treatments $Self Care/Home Management : 23-37 mins  Matthew Folks, OTR/L [facility] [phone]

## 2021-02-08 NOTE — Progress Notes (Signed)
At pt's request, 2L [place] applied. Pt endorsed "needing it" and that she uses it from time to time at home. No increased WOB or ShOB observed. SpO2 stable.

## 2021-02-08 NOTE — Progress Notes (Signed)
[facility]  ROUNDING NOTE   Subjective:   Patient seen sitting up in chair, completed breakfast tray at bedside. Currently alert and oriented Denies shortness of breath Denies pain or discomfort  Sodium improved to 131 Urine output recorded was 650 mL overnight  Objective:  Vital signs in last 24 hours:  Temp:  [97.4 F (36.3 C)-98.1 F (36.7 C)] 98.1 F (36.7 C) (12/21 1139) Pulse Rate:  [55-88] 82 (12/21 1139) Resp:  [16-20] 18 (12/21 1139) BP: (118-138)/(67-87) 131/80 (12/21 1139) SpO2:  [94 %-100 %] 100 % (12/21 1139) Weight:  [58.6 kg] 58.6 kg (12/21 0436)  Weight change: 2 kg Filed Weights   02/06/21 0604 02/07/21 0413 02/08/21 0436  Weight: 56.5 kg 56.6 kg 58.6 kg    Intake/Output: I/O last 3 completed shifts: In: 4242.9 [P.O.:1200; I.V.:3042.9] Out: 1900 [Urine:1900]   Intake/Output this shift:  Total I/O In: 720 [P.O.:720] Out: 450 [Urine:450]  Physical Exam: General: NAD, sitting in chair  Head: Normocephalic, atraumatic. Moist oral mucosal membranes  Eyes: Anicteric  Lungs:  Clear to auscultation, normal effort  Heart: Regular rate and rhythm  Abdomen:  Soft, nontender  Extremities:  no peripheral edema.  Neurologic: Nonfocal, moving all four extremities  Skin: No lesions       Basic Metabolic Panel: Recent Labs  Lab 02/03/21 0636 02/04/21 0435 02/05/21 0534 02/06/21 0613 02/07/21 0630 02/08/21 0902  NA 129* 128* 126* 127* 129* 131*  K 3.6 3.8 3.7 3.9 3.7 3.4*  CL 94* 92* 94* 97* 101 102  CO2 26 27 26 24 23 25   GLUCOSE 112* 96 103* 92 103* 131*  BUN 34* 37* 36* 28* 23 16  CREATININE 1.26* 1.10* 1.20* 0.81 0.71 0.64  CALCIUM 8.2* 7.9* 7.7* 7.6* 7.2* 7.4*  MG 2.0  --   --   --   --   --   PHOS 3.6  --   --   --   --   --      Liver Function Tests: Recent Labs  Lab 02/02/21 1551  AST 50*  ALT 15  ALKPHOS 64  BILITOT 1.1  PROT 7.5  ALBUMIN 3.6    No results for input(s): LIPASE, AMYLASE in the last 168  hours. Recent Labs  Lab 02/02/21 1551  AMMONIA <10     CBC: Recent Labs  Lab 02/02/21 1551  WBC 9.3  NEUTROABS 8.2*  HGB 11.3*  HCT 35.0*  MCV 93.6  PLT 126*     Cardiac Enzymes: No results for input(s): CKTOTAL, CKMB, CKMBINDEX, TROPONINI in the last 168 hours.  BNP: Invalid input(s): POCBNP  CBG: No results for input(s): GLUCAP in the last 168 hours.  Microbiology: Results for orders placed or performed during the hospital encounter of 02/02/21  Resp Panel by RT-PCR (Flu A&B, Covid) Nasopharyngeal Swab     Status: None   Collection Time: 02/02/21  3:51 PM   Specimen: Nasopharyngeal Swab; Nasopharyngeal(NP) swabs in vial transport medium  Result Value Ref Range Status   SARS Coronavirus 2 by RT PCR NEGATIVE NEGATIVE Final    Comment: (NOTE) SARS-CoV-2 target nucleic acids are NOT DETECTED.  The SARS-CoV-2 RNA is generally detectable in upper respiratory specimens during the acute phase of infection. The lowest concentration of SARS-CoV-2 viral copies this assay can detect is 138 copies/mL. A negative result does not preclude SARS-Cov-2 infection and should not be used as the sole basis for treatment or other patient management decisions. A negative result may occur with  improper  specimen collection/handling, submission of specimen other than nasopharyngeal swab, presence of viral mutation(s) within the areas targeted by this assay, and inadequate number of viral copies(<138 copies/mL). A negative result must be combined with clinical observations, patient history, and epidemiological information. The expected result is Negative.  Fact Sheet for Patients:  EntrepreneurPulse.com.au  Fact Sheet for Healthcare Providers:  IncredibleEmployment.be  This test is no t yet approved or cleared by the [place] FDA and  has been authorized for detection and/or diagnosis of SARS-CoV-2 by FDA under an Emergency Use Authorization  (EUA). This EUA will remain  in effect (meaning this test can be used) for the duration of the COVID-19 declaration under Section 564(b)(1) of the Act, 21 U.S.C.section 360bbb-3(b)(1), unless the authorization is terminated  or revoked sooner.       Influenza A by PCR NEGATIVE NEGATIVE Final   Influenza B by PCR NEGATIVE NEGATIVE Final    Comment: (NOTE) The Xpert Xpress SARS-CoV-2/FLU/RSV plus assay is intended as an aid in the diagnosis of influenza from Nasopharyngeal swab specimens and should not be used as a sole basis for treatment. Nasal washings and aspirates are unacceptable for Xpert Xpress SARS-CoV-2/FLU/RSV testing.  Fact Sheet for Patients: EntrepreneurPulse.com.au  Fact Sheet for Healthcare Providers: IncredibleEmployment.be  This test is not yet approved or cleared by the [place] FDA and has been authorized for detection and/or diagnosis of SARS-CoV-2 by FDA under an Emergency Use Authorization (EUA). This EUA will remain in effect (meaning this test can be used) for the duration of the COVID-19 declaration under Section 564(b)(1) of the Act, 21 U.S.C. section 360bbb-3(b)(1), unless the authorization is terminated or revoked.  Performed at [facility], [address], [place], [place] [address]   Urine Culture     Status: Abnormal   Collection Time: 02/02/21  5:13 PM   Specimen: Urine, Random  Result Value Ref Range Status   Specimen Description   Final    URINE, RANDOM Performed at [facility], [address] [place], [place] [address]    Special Requests   Final    NONE Performed at [facility], [place], [place] [address]    Culture >=100,000 COLONIES/mL ESCHERICHIA COLI (A)  Final   Report Status 02/04/2021 FINAL  Final   Organism ID, Bacteria ESCHERICHIA COLI (A)  Final      Susceptibility   Escherichia coli - MIC*    AMPICILLIN <=2 SENSITIVE Sensitive      CEFAZOLIN <=4 SENSITIVE Sensitive     CEFEPIME <=0.12 SENSITIVE Sensitive     CEFTRIAXONE <=0.25 SENSITIVE Sensitive     CIPROFLOXACIN >=4 RESISTANT Resistant     GENTAMICIN <=1 SENSITIVE Sensitive     IMIPENEM <=0.25 SENSITIVE Sensitive     NITROFURANTOIN <=16 SENSITIVE Sensitive     TRIMETH/SULFA >=320 RESISTANT Resistant     AMPICILLIN/SULBACTAM <=2 SENSITIVE Sensitive     PIP/TAZO <=4 SENSITIVE Sensitive     * >=100,000 COLONIES/mL ESCHERICHIA COLI    Coagulation Studies: No results for input(s): LABPROT, INR in the last 72 hours.  Urinalysis: No results for input(s): COLORURINE, LABSPEC, PHURINE, GLUCOSEU, HGBUR, BILIRUBINUR, KETONESUR, PROTEINUR, UROBILINOGEN, NITRITE, LEUKOCYTESUR in the last 72 hours.  Invalid input(s): APPERANCEUR    Imaging: No results found.   Medications:    sodium chloride      apixaban  2.5 mg Oral BID   atorvastatin  80 mg Oral Daily   cephALEXin  500 mg Oral BID   isosorbide mononitrate  15 mg Oral Daily   metoprolol tartrate  50 mg Oral BID   sodium chloride flush  3 mL Intravenous Q12H   sodium chloride, acetaminophen, acetaminophen, ondansetron (ZOFRAN) IV, sodium chloride flush  Assessment/ Plan:  Ms. Rebekah Bridges is a 87 y.o.  female with past medical history of CHF, hypertension, paroxysmal A. fib, hypertension, who was admitted to [facility] on 02/02/2021 for CHF exacerbation (HCC) [I50.9] Acute cystitis without hematuria [N30.00] Troponin I above reference range [R77.8] Longstanding persistent atrial fibrillation (HCC) [I48.11] Acute on chronic congestive heart failure, unspecified heart failure type (HCC) [I50.9] Dementia, unspecified dementia severity, unspecified dementia type, unspecified whether behavioral, psychotic, or mood disturbance or anxiety (HCC) [F03.90]   Hyponatremia. Appears to have been normal in September. Continue to hold diuretic. CT chest negative for lung mass.  -Sodium 131 this AM.  IV fluids  stopped -Patient cleared to discharge from our stance.  2.  Acute Kidney Injury with baseline creatinine 0.86 and GFR of 59 on 02/02/21.  Acute kidney injury secondary to diuresis No acute indication for dialysis. No exposure to IV contrast.   Renal function has returned to normal Lab Results  Component Value Date   CREATININE 0.64 02/08/2021   CREATININE 0.71 02/07/2021   CREATININE 0.81 02/06/2021    Intake/Output Summary (Last 24 hours) at 02/08/2021 1701 Last data filed at 02/08/2021 1554 Gross per 24 hour  Intake 2296.7 ml  Output 1100 ml  Net 1196.7 ml        LOS: 6   12/21/20225:01 PM

## 2021-02-08 NOTE — Care Management Important Message (Signed)
Important Message  Patient Details  Name: Rebekah Bridges MRN: [id number] Date of Birth: 1915-11-18   Medicare Important Message Given:  Yes     Johnell Comings 02/08/2021, 10:54 AM

## 2021-02-08 NOTE — Progress Notes (Signed)
Physical Therapy Treatment Patient Details Name: Cappie Glockner MRN: [id number] DOB: 17-Jul-1915 Today's Date: 02/08/2021   History of Present Illness Syrah Dinneen is a 87 y/o female w/ PMH: afib, osteoporosis, HTN, CHF and chronic hypoxic respiratory failure on 2Lnc PRN. She presented to ED via EMS from [facility] d/t concerns for low oxygen saturations; admitted for mangement of acute/chronic respiratory failure secondary to CHF exacerbation.    PT Comments    Min encouragement for participation, but does very well once engaged with session.  Completes all mobility with no greater than cga/close sup; improved walker position and standing posture this date; steady (but slower) cadence, with good overall stability.  Good awareness of paced activity, self-directing distance and activity level (BORG 8/10) after 50' distance.  Patient hopeful for discharge home soon.    Recommendations for follow up therapy are one component of a multi-disciplinary discharge planning process, led by the attending physician.  Recommendations may be updated based on patient status, additional functional criteria and insurance authorization.  Follow Up Recommendations  Home health PT     Assistance Recommended at Discharge PRN  Equipment Recommendations       Recommendations for Other Services       Precautions / Restrictions Precautions Precautions: Fall Restrictions Weight Bearing Restrictions: No     Mobility  Bed Mobility               General bed mobility comments: In recliner beginnign/end of treatment session    Transfers Overall transfer level: Needs assistance Equipment used: Rolling walker (2 wheels) Transfers: Sit to/from Stand Sit to Stand: Min guard;Supervision           General transfer comment: cuing for hand placement, tends to pull on RW despite cues.    Ambulation/Gait Ambulation/Gait assistance: Min guard;Supervision Gait Distance (Feet): 50 Feet Assistive  device: Rolling walker (2 wheels)         General Gait Details: improved walker position and standing posture this date; steady (but slower) cadence, with good overall stability.  Good awareness of paced activity, self-directing distance and activity level (BORG 8/10) after 50' distance.   Stairs             Wheelchair Mobility    Modified Rankin (Stroke Patients Only)       Balance Overall balance assessment: Needs assistance Sitting-balance support: No upper extremity supported;Feet supported Sitting balance-Leahy Scale: Good     Standing balance support: Bilateral upper extremity supported Standing balance-Leahy Scale: Fair                              Cognition Arousal/Alertness: Awake/alert Behavior During Therapy: WFL for tasks assessed/performed Overall Cognitive Status: Within Functional Limits for tasks assessed                                          Exercises Other Exercises Other Exercises: 5x sit/stand with RW, cga/close sup--23 seconds.  Decreased power/endurance bilat LEs, does require UE support to complete (evident of increased fall risk with funcitonal activity) Other Exercises: Reviewed seated LE therex for use as HEP-ankle pumps, LAQs, marching.  Patient demonstrated indep performance x5 each bilat and voices understanding/agreement in performance outside of therapy.    General Comments        Pertinent Vitals/Pain Pain Assessment: No/denies pain    Home Living  Prior Function            PT Goals (current goals can now be found in the care plan section) Acute Rehab PT Goals Patient Stated Goal: "I need to go to the bathroom" PT Goal Formulation: With patient Time For Goal Achievement: 02/17/21 Potential to Achieve Goals: Good Progress towards PT goals: Progressing toward goals    Frequency    Min 2X/week      PT Plan Current plan remains appropriate     Co-evaluation              AM-PAC PT "6 Clicks" Mobility   Outcome Measure  Help needed turning from your back to your side while in a flat bed without using bedrails?: None Help needed moving from lying on your back to sitting on the side of a flat bed without using bedrails?: A Little Help needed moving to and from a bed to a chair (including a wheelchair)?: A Little Help needed standing up from a chair using your arms (e.g., wheelchair or bedside chair)?: A Little Help needed to walk in hospital room?: A Little Help needed climbing 3-5 steps with a railing? : A Little 6 Click Score: 19    End of Session Equipment Utilized During Treatment: Gait belt Activity Tolerance: Patient tolerated treatment well Patient left: in chair;with chair alarm set;with call bell/phone within reach   PT Visit Diagnosis: Muscle weakness (generalized) (M62.81);Difficulty in walking, not elsewhere classified (R26.2)     Time: 8182-9937 PT Time Calculation (min) (ACUTE ONLY): 16 min  Charges:  $Gait Training: 8-22 mins                     Tadeo Besecker H. Manson Passey, PT, DPT, NCS 02/08/21, 11:15 AM [phone]

## 2021-02-08 NOTE — Discharge Summary (Signed)
Physician Discharge Summary  Rebekah Bridges [id number] DOB: 26-May-1915 DOA: 02/02/2021  PCP: System, Provider Not In  Admit date: 02/02/2021 Discharge date: 02/08/2021  Admitted From: [facility] Disposition: [facility]  Recommendations for Outpatient Follow-up:  Follow up with PCP in 1-2 weeks Follow-up with cardiology, Dr. Nehemiah Massed 1 week Metoprolol tartrate increased to 50 mg p.o. twice daily Started on Eliquis for atrial fibrillation Holding home furosemide due to hyponatremia Please obtain BMP in one week to assess sodium level Recommend monitoring daily weights  Home Health: PT/OT Equipment/Devices: None  Discharge Condition: Stable CODE STATUS: DNR Diet recommendation: Heart healthy diet  History of present illness:  Rebekah Bridges is a 87 year old female with past medical history significant for essential hypertension, chronic combined systolic and diastolic congestive heart failure, osteoporosis, paroxysmal atrial fibrillation, chronic respiratory failure on 2 L oxygen who presented from [facility] with burning urination, weakness, falls.  Evaluation the ED with urinalysis consistent with acute UTI and sodium 127.  Patient was started on antibiotics and IV fluid hydration.  Hospitalist service consulted for further evaluation and management.   Hospital course:  E. coli urinary tract infection Patient presented to the hospital with burning urination, urinalysis consistent with urinary tract infection and urine culture positive for E. coli.  Patient was initially started on IV antibiotics with ceftriaxone and transition to Keflex in which she completed 7-day course during hospitalization.  Paroxysmal atrial fibrillation with RVR Cardiology was consulted and followed during hospital course.  Patient was noted to be in A. fib with RVR and patient's metoprolol tartrate was increased to 50 mg p.o. twice daily.  Started on Eliquis 2.5 mg p.o. twice daily.   Outpatient follow-up with cardiology in 1 weeks.  Hypovolemic/hypochloremic hyponatremia Etiology likely related to low oral intake.  Nephrology was consulted and followed during hospital course.  Patient was started on IV fluid hydration with improvement of sodium from 127 to 131 at time of discharge. Continue to hold home furosemide on discharge.  Recommend repeat BMP 1 week.  Chronic systolic/diastolic congestive heart failure, compensated Chronic respiratory failure LVEF 45-50%.  Patient on furosemide outpatient but now on hold due to hyponatremia as above.  Continue metoprolol tartrate 50 mg p.o. twice daily.  Recommend monitoring of daily weights.  Outpatient follow-up with cardiology 1 week.  Continue home oxygen 2 L per nasal cannula.  Generalized weakness Seen by PT/OT during hospitalization with recommendations of a home health.  Discharge Diagnoses:  Principal Problem:   CHF exacerbation ([place]) Active Problems:   Osteoarthritis   Osteoporosis   PAD (peripheral artery disease) ([place])   Essential hypertension   Spinal stenosis of lumbar region   Acute cystitis without hematuria   Dementia (HCC)   Hyponatremia    Discharge Instructions  Discharge Instructions     (HEART FAILURE PATIENTS) Call MD:  Anytime you have any of the following symptoms: 1) 3 pound weight gain in 24 hours or 5 pounds in 1 week 2) shortness of breath, with or without a dry hacking cough 3) swelling in the hands, feet or stomach 4) if you have to sleep on extra pillows at night in order to breathe.   Complete by: As directed    Amb Referral to HF Clinic   Complete by: As directed    Call MD for:  difficulty breathing, headache or visual disturbances   Complete by: As directed    Call MD for:  extreme fatigue   Complete by: As directed  Call MD for:  persistant dizziness or light-headedness   Complete by: As directed    Call MD for:  persistant nausea and vomiting   Complete by: As directed     Call MD for:  severe uncontrolled pain   Complete by: As directed    Call MD for:  temperature >100.4   Complete by: As directed    Diet - low sodium heart healthy   Complete by: As directed    Increase activity slowly   Complete by: As directed       Allergies as of 02/08/2021       Reactions   Hydrocodone Nausea Only   Meloxicam Nausea Only   Upset stomach   Other Other (See Comments)   Tramadol Hcl Nausea Only, Other (See Comments)   sweating   Penicillins Nausea Only, Rash   Sulfa Antibiotics Nausea Only, Hives, Rash   And rash        Medication List     STOP taking these medications    furosemide 20 MG tablet Commonly known as: LASIX   Iodosorb 0.9 % gel Generic drug: cadexomer iodine   RED YEAST RICE EXTRACT PO       TAKE these medications    acetaminophen 325 MG tablet Commonly known as: TYLENOL Take 650 mg by mouth every 6 (six) hours as needed.   apixaban 2.5 MG Tabs tablet Commonly known as: ELIQUIS Take 1 tablet (2.5 mg total) by mouth 2 (two) times daily.   Aspirin Low Dose 81 MG EC tablet Generic drug: aspirin Take 1 tablet (81 mg total) by mouth daily. Swallow whole. What changed: when to take this   atorvastatin 80 MG tablet Commonly known as: LIPITOR Take 1 tablet (80 mg total) by mouth daily.   beta carotene 25000 UNIT capsule Take 25,000 Units by mouth at bedtime.   calcium carbonate 500 MG chewable tablet Commonly known as: TUMS - dosed in mg elemental calcium Chew 1 tablet by mouth 3 (three) times daily as needed for indigestion or heartburn.   cholecalciferol 25 MCG (1000 UNIT) tablet Commonly known as: VITAMIN D Take 1,000 Units by mouth at bedtime.   ibuprofen 200 MG tablet Commonly known as: ADVIL Take 200 mg by mouth every 8 (eight) hours as needed (back/knee pain).   isosorbide mononitrate 30 MG 24 hr tablet Commonly known as: IMDUR Take 0.5 tablets (15 mg total) by mouth daily.   metoprolol tartrate 50 MG  tablet Commonly known as: LOPRESSOR Take 1 tablet (50 mg total) by mouth 2 (two) times daily. What changed:  medication strength how much to take   multivitamin with minerals Tabs tablet Take 1 tablet by mouth at bedtime. One-A-Day Multivitamin        Follow-up Information     Corey Skains, MD Follow up in 1 week(s).   Specialty: Cardiology Contact information: [facility] [place] [address] [phone]                Allergies  Allergen Reactions   Hydrocodone Nausea Only   Meloxicam Nausea Only    Upset stomach   Other Other (See Comments)   Tramadol Hcl Nausea Only and Other (See Comments)    sweating   Penicillins Nausea Only and Rash   Sulfa Antibiotics Nausea Only, Hives and Rash    And rash    Consultations: Cardiology   Procedures/Studies: DG Chest 2 View  Result Date: 02/02/2021 CLINICAL DATA:  AMS, ?SOB  EXAM: CHEST - 2 VIEW COMPARISON:  10/19/2020. FINDINGS: Chronic increased lung markings. Mild left basilar opacities. No visible pleural effusions or pneumothorax. Cardiomediastinal silhouette is similar. Calcific atherosclerosis of the aorta. Vertebral augmentation at multiple thoracic levels. Polyarticular degenerative change. IMPRESSION: Mild left basilar opacities, likely atelectasis. Electronically Signed   By: Margaretha Sheffield M.D.   On: 02/02/2021 15:55   CT HEAD WO CONTRAST (5MM)  Result Date: 02/02/2021 CLINICAL DATA:  Mental status change, unknown cause EXAM: CT HEAD WITHOUT CONTRAST TECHNIQUE: Contiguous axial images were obtained from the base of the skull through the vertex without intravenous contrast. COMPARISON:  04/28/2020 FINDINGS: Brain: There is no acute intracranial hemorrhage, mass effect, or edema. Gray-white differentiation is preserved. There is no extra-axial fluid collection. Stable prominence of ventricles and sulci reflecting parenchymal volume loss. Patchy and confluent  hypoattenuation in the supratentorial white matter probably reflects stable chronic microvascular ischemic changes. Vascular: There is atherosclerotic calcification at the skull base. Skull: Calvarium is unremarkable. Sinuses/Orbits: Mild mucosal thickening. No acute orbital abnormality. Other: None. IMPRESSION: No acute intracranial abnormality. Stable chronic findings detailed above. Electronically Signed   By: Macy Mis M.D.   On: 02/02/2021 15:36   CT CHEST WO CONTRAST  Result Date: 02/05/2021 CLINICAL DATA:  Rule out lung mass. EXAM: CT CHEST WITHOUT CONTRAST TECHNIQUE: Multidetector CT imaging of the chest was performed following the standard protocol without IV contrast. COMPARISON:  Chest radiograph 02/02/2021 FINDINGS: Cardiovascular: No significant vascular findings. Scattered aortic calcifications. Multivessel coronary artery calcification. Normal heart size. No pericardial effusion. Mediastinum/Nodes: There are few calcified mediastinal lymph nodes. No mediastinal, hilar, or axillary lymphadenopathy. Two hypodense subcentimeter nodules are noted in the mid right and lower left thyroid (series 2, image 9, 17). No abnormality of the trachea or esophagus. Lungs/Pleura: Trace bilateral pleural effusions with adjacent compressive atelectasis at the lung bases. Calcified granulomas in the left upper lung. No pneumothorax. Upper Abdomen: Scattered calcified granulomas in the liver and spleen. No acute abnormality of the visualized upper abdomen. Musculoskeletal: No chest wall mass or suspicious bone lesions identified. Vertebral body augmentation of the T11 and T12 vertebral bodies. Compression fracture of the superior endplate of the T4 vertebral body with less than 25% vertebral body height loss IMPRESSION: 1. No pulmonary mass as queried. 2. Trace bilateral pleural effusions with adjacent compressive atelectasis in the lung bases. 3. Age-indeterminate compression fracture of the T4 vertebral body  superior endplate with less than 25% vertebral body height loss. 4. Aortic Atherosclerosis (ICD10-I70.0). Multivessel coronary artery disease. 5. Findings of prior granulomatous disease in the thorax and upper abdomen. 6. Subcentimeter incidental thyroid nodules. Not clinically significant; no follow-up imaging recommended (ref: J Am Coll Radiol. 2015 Feb;12(2): 143-50). Electronically Signed   By: Ileana Roup M.D.   On: 02/05/2021 15:21   ECHOCARDIOGRAM COMPLETE  Result Date: 02/06/2021    ECHOCARDIOGRAM REPORT   Patient Name:   Jeslin POGATS Asche Date of Exam: 02/06/2021 Medical Rec #:  [id number]       Height:       59.0 in Accession #:    [id number]      Weight:       124.6 lb Date of Birth:  05/11/1915       BSA:          1.508 m Patient Age:    89 years       BP:           121/82 mmHg Patient Gender: F  HR:           76 bpm. Exam Location:  [facility] Procedure: 2D Echo, Cardiac Doppler and Color Doppler Indications:     Atrial Fibrillation I48.91                  CHF-acute systolic [id number]  History:         Patient has prior history of Echocardiogram examinations, most                  recent 10/20/2020. Paroxysmal Atrial Fibrillation.  Sonographer:     Sherrie Sport Referring Phys:  [id number] AMANDA TOBIN Diagnosing Phys: Kathlyn Sacramento MD  Sonographer Comments: No parasternal window, suboptimal apical window and Technically challenging study due to limited acoustic windows. IMPRESSIONS  1. Left ventricular ejection fraction, by estimation, is 40 to 45%. The left ventricle has mildly decreased function. The left ventricle demonstrates regional wall motion abnormalities (see scoring diagram/findings for description). There is mild left ventricular hypertrophy. Left ventricular diastolic parameters are indeterminate. There is severe hypokinesis of the left ventricular, mid-apical anteroseptal wall, anterior wall and apical segment.  2. Right ventricular systolic function is normal. The right ventricular size  is normal. There is normal pulmonary artery systolic pressure.  3. The mitral valve is normal in structure. Mild mitral valve regurgitation. No evidence of mitral stenosis. Moderate mitral annular calcification.  4. The aortic valve is normal in structure. Aortic valve regurgitation is mild. Aortic valve sclerosis is present, with no evidence of aortic valve stenosis.  5. The inferior vena cava is normal in size with greater than 50% respiratory variability, suggesting right atrial pressure of 3 mmHg. FINDINGS  Left Ventricle: Left ventricular ejection fraction, by estimation, is 40 to 45%. The left ventricle has mildly decreased function. The left ventricle demonstrates regional wall motion abnormalities. Severe hypokinesis of the left ventricular, mid-apical  anteroseptal wall, anterior wall and apical segment. The left ventricular internal cavity size was normal in size. There is mild left ventricular hypertrophy. Left ventricular diastolic parameters are indeterminate. Right Ventricle: The right ventricular size is normal. No increase in right ventricular wall thickness. Right ventricular systolic function is normal. There is normal pulmonary artery systolic pressure. The tricuspid regurgitant velocity is 2.82 m/s, and  with an assumed right atrial pressure of 3 mmHg, the estimated right ventricular systolic pressure is [id number]. Left Atrium: Left atrial size was normal in size. Right Atrium: Right atrial size was normal in size. Pericardium: There is no evidence of pericardial effusion. Mitral Valve: The mitral valve is normal in structure. Moderate mitral annular calcification. Mild mitral valve regurgitation. No evidence of mitral valve stenosis. MV peak gradient, 7.0 mmHg. The mean mitral valve gradient is 2.0 mmHg. Tricuspid Valve: The tricuspid valve is normal in structure. Tricuspid valve regurgitation is trivial. No evidence of tricuspid stenosis. Aortic Valve: The aortic valve is normal in structure.  Aortic valve regurgitation is mild. Aortic valve sclerosis is present, with no evidence of aortic valve stenosis. Aortic valve mean gradient measures 2.0 mmHg. Aortic valve peak gradient measures 4.0  mmHg. Aortic valve area, by VTI measures 3.14 cm. Pulmonic Valve: The pulmonic valve was normal in structure. Pulmonic valve regurgitation is not visualized. No evidence of pulmonic stenosis. Aorta: The aortic root is normal in size and structure. Venous: The inferior vena cava is normal in size with greater than 50% respiratory variability, suggesting right atrial pressure of 3 mmHg. IAS/Shunts: No atrial level shunt detected by color flow Doppler.  LEFT  VENTRICLE PLAX 2D LVIDd:         3.80 cm LVIDs:         2.30 cm LV PW:         1.10 cm LV IVS:        1.00 cm LVOT diam:     2.00 cm LV SV:         48 LV SV Index:   32 LVOT Area:     3.14 cm  RIGHT VENTRICLE RV Basal diam:  3.60 cm RV S prime:     11.30 cm/s TAPSE (M-mode): 3.1 cm LEFT ATRIUM           Index        RIGHT ATRIUM           Index LA diam:      3.80 cm 2.52 cm/m   RA Area:     15.50 cm LA Vol (A2C): 73.0 ml 48.40 ml/m  RA Volume:   42.30 ml  28.05 ml/m LA Vol (A4C): 17.9 ml 11.87 ml/m  AORTIC VALVE AV Area (Vmax):    2.75 cm AV Area (Vmean):   2.82 cm AV Area (VTI):     3.14 cm AV Vmax:           100.00 cm/s AV Vmean:          67.200 cm/s AV VTI:            0.153 m AV Peak Grad:      4.0 mmHg AV Mean Grad:      2.0 mmHg LVOT Vmax:         87.50 cm/s LVOT Vmean:        60.300 cm/s LVOT VTI:          0.153 m LVOT/AV VTI ratio: 1.00  AORTA Ao Root diam: 2.20 cm MITRAL VALVE                TRICUSPID VALVE MV Area (PHT): 5.50 cm     TR Peak grad:   31.8 mmHg MV Area VTI:   2.91 cm     TR Vmax:        282.00 cm/s MV Peak grad:  7.0 mmHg MV Mean grad:  2.0 mmHg     SHUNTS MV Vmax:       1.32 m/s     Systemic VTI:  0.15 m MV Vmean:      63.8 cm/s    Systemic Diam: 2.00 cm MV Decel Time: 138 msec MV E velocity: 104.00 cm/s MV A velocity: 47.10 cm/s MV  E/A ratio:  2.21 Kathlyn Sacramento MD Electronically signed by Kathlyn Sacramento MD Signature Date/Time: 02/06/2021/3:18:02 PM    Final      Subjective: Patient seen examined at bedside, resting comfortably.  Working with physical therapy this morning.  Seen by nephrology and now sodium up to 131 and okay for discharge back to her ALF.  No other questions or concerns at this time.  Updated patient's daughter via telephone.  Patient denies headache, no chest pain, no shortness of breath, no abdominal pain, no fever/chills/night sweats, no nausea/vomiting/diarrhea.  No acute events reported overnight by nursing staff.  Discharge Exam: Vitals:   02/08/21 0721 02/08/21 1139  BP: 138/67 131/80  Pulse: 86 82  Resp: 17 18  Temp: 97.8 F (36.6 C) 98.1 F (36.7 C)  SpO2: 94% 100%   Vitals:   02/07/21 2341 02/08/21 0436 02/08/21 0721 02/08/21 1139  BP: 137/87 128/82 138/67 131/80  Pulse: (!) 55 88 86 82  Resp: 16 18 17 18   Temp: (!) 97.4 F (36.3 C) 97.6 F (36.4 C) 97.8 F (36.6 C) 98.1 F (36.7 C)  TempSrc: Oral Oral Oral   SpO2: 97% 98% 94% 100%  Weight:  58.6 kg    Height:        General: Pt is alert, awake, not in acute distress Cardiovascular: RRR, S1/S2 +, no rubs, no gallops Respiratory: CTA bilaterally, no wheezing, no rhonchi Abdominal: Soft, NT, ND, bowel sounds + Extremities: trace lower extremity edema with chronic skin changes bilateral lower extremities no cyanosis    The results of significant diagnostics from this hospitalization (including imaging, microbiology, ancillary and laboratory) are listed below for reference.     Microbiology: Recent Results (from the past 240 hour(s))  Resp Panel by RT-PCR (Flu A&B, Covid) Nasopharyngeal Swab     Status: None   Collection Time: 02/02/21  3:51 PM   Specimen: Nasopharyngeal Swab; Nasopharyngeal(NP) swabs in vial transport medium  Result Value Ref Range Status   SARS Coronavirus 2 by RT PCR NEGATIVE NEGATIVE Final     Comment: (NOTE) SARS-CoV-2 target nucleic acids are NOT DETECTED.  The SARS-CoV-2 RNA is generally detectable in upper respiratory specimens during the acute phase of infection. The lowest concentration of SARS-CoV-2 viral copies this assay can detect is 138 copies/mL. A negative result does not preclude SARS-Cov-2 infection and should not be used as the sole basis for treatment or other patient management decisions. A negative result may occur with  improper specimen collection/handling, submission of specimen other than nasopharyngeal swab, presence of viral mutation(s) within the areas targeted by this assay, and inadequate number of viral copies(<138 copies/mL). A negative result must be combined with clinical observations, patient history, and epidemiological information. The expected result is Negative.  Fact Sheet for Patients:  02/04/21  Fact Sheet for Healthcare Providers:  BloggerCourse.com  This test is no t yet approved or cleared by the [employer] FDA and  has been authorized for detection and/or diagnosis of SARS-CoV-2 by FDA under an Emergency Use Authorization (EUA). This EUA will remain  in effect (meaning this test can be used) for the duration of the COVID-19 declaration under Section 564(b)(1) of the Act, 21 U.S.C.section 360bbb-3(b)(1), unless the authorization is terminated  or revoked sooner.       Influenza A by PCR NEGATIVE NEGATIVE Final   Influenza B by PCR NEGATIVE NEGATIVE Final    Comment: (NOTE) The Xpert Xpress SARS-CoV-2/FLU/RSV plus assay is intended as an aid in the diagnosis of influenza from Nasopharyngeal swab specimens and should not be used as a sole basis for treatment. Nasal washings and aspirates are unacceptable for Xpert Xpress SARS-CoV-2/FLU/RSV testing.  Fact Sheet for Patients: [place]  Fact Sheet for Healthcare  Providers: BloggerCourse.com  This test is not yet approved or cleared by the [employer]ious[employer] FDA and has been authorized for detection and/or diagnosis of SARS-CoV-2 by FDA under an Emergency Use Authorization (EUA). This EUA will remain in effect (meaning this test can be used) for the duration of the COVID-19 declaration under Section 564(b)(1) of the Act, 21 U.S.C. section 360bbb-3(b)(1), unless the authorization is terminated or revoked.  Performed at [facility], [address] [place], [place]   Urine Culture     Status: Abnormal   Collection Time: 02/02/21  5:13 PM   Specimen: Urine, Random  Result Value Ref Range Status   Specimen Description   Final  URINE, RANDOM Performed at [facility], [address] [place], [place] [address]    Special Requests   Final    NONE Performed at [facility], [address] [place], [place] [address]    Culture >=100,000 COLONIES/mL ESCHERICHIA COLI (A)  Final   Report Status 02/04/2021 FINAL  Final   Organism ID, Bacteria ESCHERICHIA COLI (A)  Final      Susceptibility   Escherichia coli - MIC*    AMPICILLIN <=2 SENSITIVE Sensitive     CEFAZOLIN <=4 SENSITIVE Sensitive     CEFEPIME <=0.12 SENSITIVE Sensitive     CEFTRIAXONE <=0.25 SENSITIVE Sensitive     CIPROFLOXACIN >=4 RESISTANT Resistant     GENTAMICIN <=1 SENSITIVE Sensitive     IMIPENEM <=0.25 SENSITIVE Sensitive     NITROFURANTOIN <=16 SENSITIVE Sensitive     TRIMETH/SULFA >=320 RESISTANT Resistant     AMPICILLIN/SULBACTAM <=2 SENSITIVE Sensitive     PIP/TAZO <=4 SENSITIVE Sensitive     * >=100,000 COLONIES/mL ESCHERICHIA COLI     Labs: BNP (last 3 results) Recent Labs    02/02/21 1551  BNP [id number]*   Basic Metabolic Panel: Recent Labs  Lab 02/03/21 0636 02/04/21 0435 02/05/21 0534 02/06/21 0613 02/07/21 0630 02/08/21 0902  NA 129* 128* 126* 127* 129* 131*  K 3.6 3.8 3.7 3.9 3.7 3.4*   CL 94* 92* 94* 97* 101 102  CO2 26 27 26 24 23 25   GLUCOSE 112* 96 103* 92 103* 131*  BUN 34* 37* 36* 28* 23 16  CREATININE 1.26* 1.10* 1.20* 0.81 0.71 0.64  CALCIUM 8.2* 7.9* 7.7* 7.6* 7.2* 7.4*  MG 2.0  --   --   --   --   --   PHOS 3.6  --   --   --   --   --    Liver Function Tests: Recent Labs  Lab 02/02/21 1551  AST 50*  ALT 15  ALKPHOS 64  BILITOT 1.1  PROT 7.5  ALBUMIN 3.6   No results for input(s): LIPASE, AMYLASE in the last 168 hours. Recent Labs  Lab 02/02/21 1551  AMMONIA <10   CBC: Recent Labs  Lab 02/02/21 1551  WBC 9.3  NEUTROABS 8.2*  HGB 11.3*  HCT 35.0*  MCV 93.6  PLT 126*   Cardiac Enzymes: No results for input(s): CKTOTAL, CKMB, CKMBINDEX, TROPONINI in the last 168 hours. BNP: Invalid input(s): POCBNP CBG: No results for input(s): GLUCAP in the last 168 hours. D-Dimer No results for input(s): DDIMER in the last 72 hours. Hgb A1c No results for input(s): HGBA1C in the last 72 hours. Lipid Profile No results for input(s): CHOL, HDL, LDLCALC, TRIG, CHOLHDL, LDLDIRECT in the last 72 hours. Thyroid function studies No results for input(s): TSH, T4TOTAL, T3FREE, THYROIDAB in the last 72 hours.  Invalid input(s): FREET3 Anemia work up No results for input(s): VITAMINB12, FOLATE, FERRITIN, TIBC, IRON, RETICCTPCT in the last 72 hours. Urinalysis    Component Value Date/Time   COLORURINE YELLOW 02/02/2021 1710   APPEARANCEUR CLOUDY (A) 02/02/2021 1710   LABSPEC 1.020 02/02/2021 1710   PHURINE 5.5 02/02/2021 1710   GLUCOSEU NEGATIVE 02/02/2021 1710   HGBUR LARGE (A) 02/02/2021 1710   BILIRUBINUR NEGATIVE 02/02/2021 1710   KETONESUR NEGATIVE 02/02/2021 1710   PROTEINUR 100 (A) 02/02/2021 1710   NITRITE POSITIVE (A) 02/02/2021 1710   LEUKOCYTESUR LARGE (A) 02/02/2021 1710   Sepsis Labs Invalid input(s): PROCALCITONIN,  WBC,  LACTICIDVEN Microbiology Recent Results (from the past 240  hour(s))  Resp Panel by RT-PCR (Flu A&B, Covid)  Nasopharyngeal Swab     Status: None   Collection Time: 02/02/21  3:51 PM   Specimen: Nasopharyngeal Swab; Nasopharyngeal(NP) swabs in vial transport medium  Result Value Ref Range Status   SARS Coronavirus 2 by RT PCR NEGATIVE NEGATIVE Final    Comment: (NOTE) SARS-CoV-2 target nucleic acids are NOT DETECTED.  The SARS-CoV-2 RNA is generally detectable in upper respiratory specimens during the acute phase of infection. The lowest concentration of SARS-CoV-2 viral copies this assay can detect is 138 copies/mL. A negative result does not preclude SARS-Cov-2 infection and should not be used as the sole basis for treatment or other patient management decisions. A negative result may occur with  improper specimen collection/handling, submission of specimen other than nasopharyngeal swab, presence of viral mutation(s) within the areas targeted by this assay, and inadequate number of viral copies(<138 copies/mL). A negative result must be combined with clinical observations, patient history, and epidemiological information. The expected result is Negative.  Fact Sheet for Patients:  EntrepreneurPulse.com.au  Fact Sheet for Healthcare Providers:  IncredibleEmployment.be  This test is no t yet approved or cleared by the [place] FDA and  has been authorized for detection and/or diagnosis of SARS-CoV-2 by FDA under an Emergency Use Authorization (EUA). This EUA will remain  in effect (meaning this test can be used) for the duration of the COVID-19 declaration under Section 564(b)(1) of the Act, 21 U.S.C.section 360bbb-3(b)(1), unless the authorization is terminated  or revoked sooner.       Influenza A by PCR NEGATIVE NEGATIVE Final   Influenza B by PCR NEGATIVE NEGATIVE Final    Comment: (NOTE) The Xpert Xpress SARS-CoV-2/FLU/RSV plus assay is intended as an aid in the diagnosis of influenza from Nasopharyngeal swab specimens and should not be  used as a sole basis for treatment. Nasal washings and aspirates are unacceptable for Xpert Xpress SARS-CoV-2/FLU/RSV testing.  Fact Sheet for Patients: EntrepreneurPulse.com.au  Fact Sheet for Healthcare Providers: IncredibleEmployment.be  This test is not yet approved or cleared by the [place] FDA and has been authorized for detection and/or diagnosis of SARS-CoV-2 by FDA under an Emergency Use Authorization (EUA). This EUA will remain in effect (meaning this test can be used) for the duration of the COVID-19 declaration under Section 564(b)(1) of the Act, 21 U.S.C. section 360bbb-3(b)(1), unless the authorization is terminated or revoked.  Performed at [facility], [address] [place], [place] [address]   Urine Culture     Status: Abnormal   Collection Time: 02/02/21  5:13 PM   Specimen: Urine, Random  Result Value Ref Range Status   Specimen Description   Final    URINE, RANDOM Performed at [facility], [address] [place], [place] [address]    Special Requests   Final    NONE Performed at [facility], [address] [place], [place] [address]    Culture >=100,000 COLONIES/mL ESCHERICHIA COLI (A)  Final   Report Status 02/04/2021 FINAL  Final   Organism ID, Bacteria ESCHERICHIA COLI (A)  Final      Susceptibility   Escherichia coli - MIC*    AMPICILLIN <=2 SENSITIVE Sensitive     CEFAZOLIN <=4 SENSITIVE Sensitive     CEFEPIME <=0.12 SENSITIVE Sensitive     CEFTRIAXONE <=0.25 SENSITIVE Sensitive     CIPROFLOXACIN >=4 RESISTANT Resistant     GENTAMICIN <=1 SENSITIVE Sensitive     IMIPENEM <=0.25 SENSITIVE Sensitive  NITROFURANTOIN <=16 SENSITIVE Sensitive     TRIMETH/SULFA >=320 RESISTANT Resistant     AMPICILLIN/SULBACTAM <=2 SENSITIVE Sensitive     PIP/TAZO <=4 SENSITIVE Sensitive     * >=100,000 COLONIES/mL ESCHERICHIA COLI     Time coordinating discharge: Over 30  minutes  SIGNED:   Tailynn Armetta J [place] DO  [facility] 02/08/2021, 12:41 PM

## 2021-02-23 ENCOUNTER — Ambulatory Visit: Payer: Medicare Other | Admitting: Family

## 2021-05-12 ENCOUNTER — Emergency Department
Admission: EM | Admit: 2021-05-12 | Discharge: 2021-05-12 | Disposition: A | Discharge status: Skilled Nursing Facility | Attending: Emergency Medicine | Admitting: Emergency Medicine

## 2021-05-12 ENCOUNTER — Other Ambulatory Visit: Payer: Self-pay

## 2021-05-12 ENCOUNTER — Emergency Department

## 2021-05-12 ENCOUNTER — Encounter: Payer: Self-pay | Admitting: Emergency Medicine

## 2021-05-12 DIAGNOSIS — S0003XA Contusion of scalp, initial encounter: Secondary | ICD-10-CM | POA: Insufficient documentation

## 2021-05-12 DIAGNOSIS — Z7901 Long term (current) use of anticoagulants: Secondary | ICD-10-CM | POA: Diagnosis not present

## 2021-05-12 DIAGNOSIS — W01198A Fall on same level from slipping, tripping and stumbling with subsequent striking against other object, initial encounter: Secondary | ICD-10-CM | POA: Insufficient documentation

## 2021-05-12 DIAGNOSIS — S0990XA Unspecified injury of head, initial encounter: Secondary | ICD-10-CM

## 2021-05-12 NOTE — ED Notes (Addendum)
This RN attempted to call [facility] and speak with staff about DC. Got disconnected twice. And daughter will take her back to facility.  ?

## 2021-05-12 NOTE — ED Provider Notes (Signed)
? ?[facility] ?Provider Note ? ? ? Event Date/Time  ? First MD Initiated Contact with Patient 05/12/21 [phone]   ?  (approximate) ? ?History  ? ?Chief Complaint: Fall ? ?HPI ? ?Rebekah Bridges is a 88 y.o. female who presents to the emergency department after a fall.  According to the patient she fell backwards hitting the back of her head.  Patient denies LOC or vomiting.  Patient is awake alert, calm cooperative and pleasant.  Patient states she is ready to go home.  Her only complaint is pain to the back of the head.  Per record review patient appears to be on Eliquis twice daily. ? ?Physical Exam  ? ?Triage Vital Signs: ?ED Triage Vitals  ?Enc Vitals Group  ?   BP 05/12/21 0738 (!) 178/98  ?   Pulse Rate 05/12/21 0738 96  ?   Resp 05/12/21 0738 18  ?   Temp 05/12/21 0738 98.1 ?F (36.7 ?C)  ?   Temp Source 05/12/21 0738 Oral  ?   SpO2 05/12/21 0738 100 %  ?   Weight 05/12/21 0737 129 lb 3 oz (58.6 kg)  ?   Height 05/12/21 0737 4\' 11"  (1.499 m)  ?   Head Circumference --   ?   Peak Flow --   ?   Pain Score 05/12/21 0737 0  ?   Pain Loc --   ?   Pain Edu? --   ?   Excl. in [place]? --   ? ? ?Most recent vital signs: ?Vitals:  ? 05/12/21 0738  ?BP: (!) 178/98  ?Pulse: 96  ?Resp: 18  ?Temp: 98.1 ?F (36.7 ?C)  ?SpO2: 100%  ? ? ?General: Awake, no distress.  ?CV:  Good peripheral perfusion.  Regular rate and rhythm  ?Resp:  Normal effort.  Equal breath sounds bilaterally.  ?Abd:  No distention.  Soft, nontender.  No rebound or guarding. ?Other:  Moderate hematoma to the left occipital scalp ? ? ?ED Results / Procedures / Treatments  ? ?EKG ? ?EKG viewed and interpreted by myself shows a sinus rhythm at 94 bpm with a narrow QRS, normal axis, normal intervals, no concerning ST changes. ? ?RADIOLOGY ? ?I personally reviewed the CT images.  No acute intracranial abnormality seen on my evaluation at least no large bleed. ?Radiology is read the CT scan is negative for acute intracranial abnormality, moderate  size scalp hematoma.  C-spine negative. ? ? ?MEDICATIONS ORDERED IN ED: ?Medications - No data to display ? ? ?IMPRESSION / MDM / ASSESSMENT AND PLAN / ED COURSE  ?I reviewed the triage vital signs and the nursing notes. ? ?Patient presents to the emergency department for a fall.  Patient does have an occipital scalp hematoma and is on Eliquis for paroxysmal atrial fibrillation.  Appears to be in normal sinus rhythm currently.  We will obtain CT imaging of the head and C-spine as precaution.  Patient's only complaint is mild pain to the back of the head.  She is awake alert, calm cooperative pleasant, appears younger than stated age. ? ?CT scans are reassuring.  Patient continues to appear well.  We will discharge home with Tylenol to be used as needed for discomfort as written on the box.  Patient agreeable to plan. ? ? ?FINAL CLINICAL IMPRESSION(S) / ED DIAGNOSES  ? ?Fall ?Head injury ? ?Rx / DC Orders  ? ?Supportive care, PCP follow-up ? ?Note:  This document was prepared using Set designer  software and may include unintentional dictation errors. ?  Harvest Dark, MD ?05/12/21 [phone] ? ?

## 2021-05-12 NOTE — Discharge Instructions (Signed)
Please use Tylenol as needed for discomfort as written on the box.  Please follow-up with your doctor in the next 2 to 3 days for recheck/reevaluation.  Return to the emergency department for any significant headache, weakness or numbness of any arm or leg confusion or slurred speech. ?

## 2021-05-12 NOTE — ED Notes (Signed)
Daughter notified. On her way.  ?

## 2021-05-12 NOTE — ED Triage Notes (Signed)
Patient to ED from [facility]. Patient had an unwitnessed fall in bathroom this AM hitting the back of her head. Per EMS patient does not remember falling or hitting head. ?

## 2021-07-20 DEATH — deceased

## 2022-06-27 IMAGING — MR MR FOOT*R* W/O CM
4 of 5 series · 22 of 40 positions shown · non-contrast
Comparison: None.

CLINICAL DATA: Open wound right great toe status post recent
debridement. Question osteomyelitis.

EXAM:
MRI OF THE RIGHT FOREFOOT WITHOUT CONTRAST
TECHNIQUE: Multiplanar, multisequence MR imaging of the right forefoot was
performed. No intravenous contrast was administered.

[Series 4: T1 · coronal · 3.0mm · 0.23mm/px · 3 of 44 slices shown (1 of 2)]
[im 5/44]
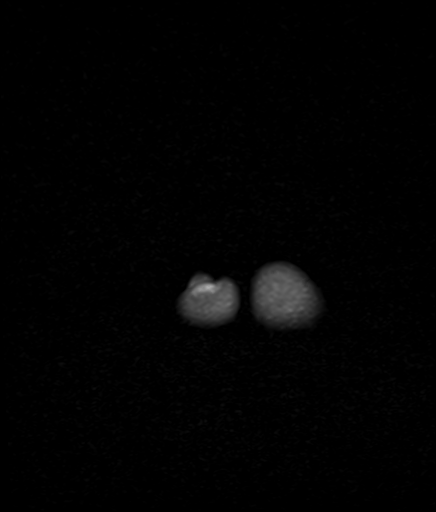
[im 22/44]
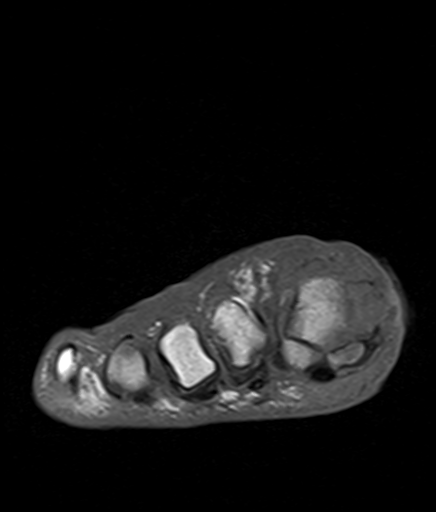
[im 39/44]
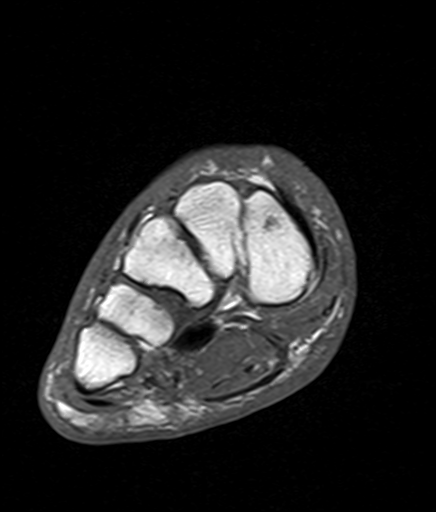

[Series 5: T2 fat-sat · coronal · 3.0mm · 0.23mm/px · 11 of 44 slices shown (1 of 2)]
[im 1/44]
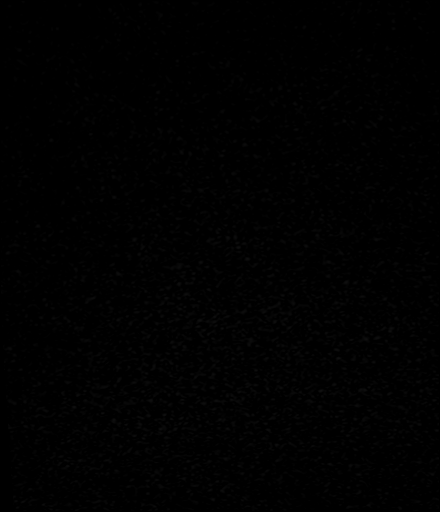
[im 5/44]
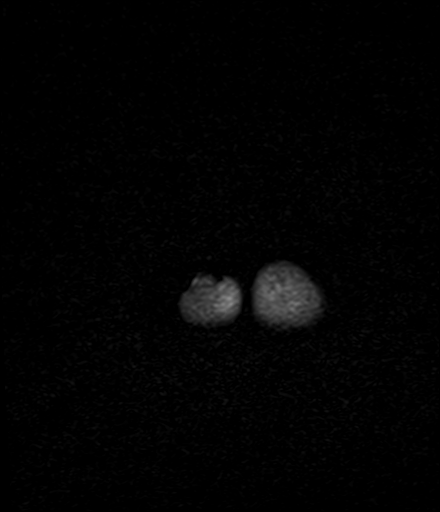
[im 9/44]
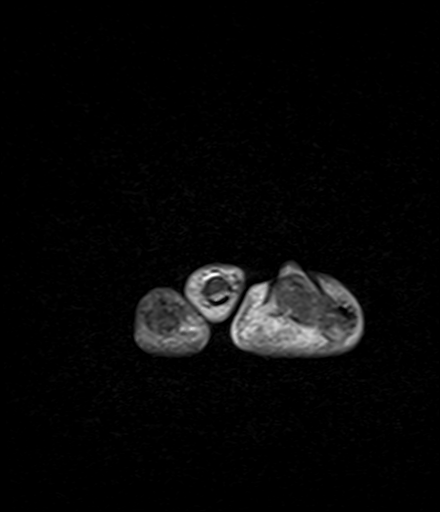
[im 13/44]
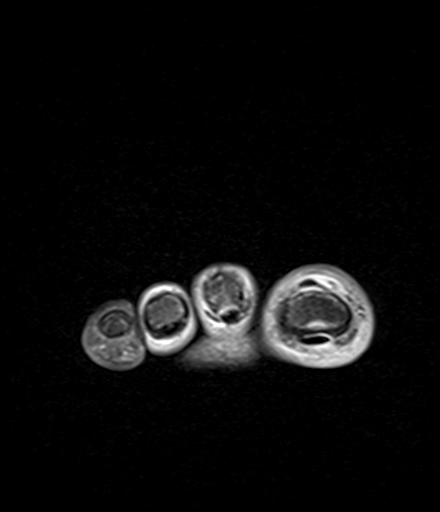
[im 18/44]
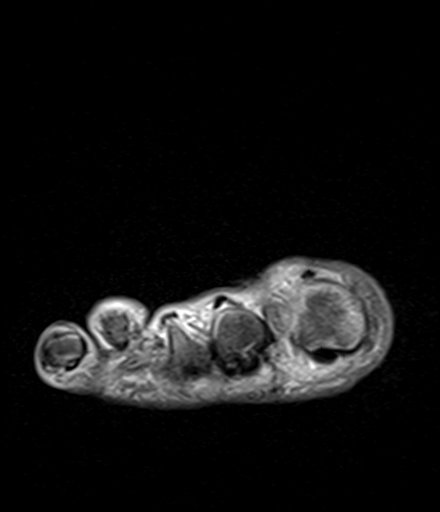
[im 22/44]
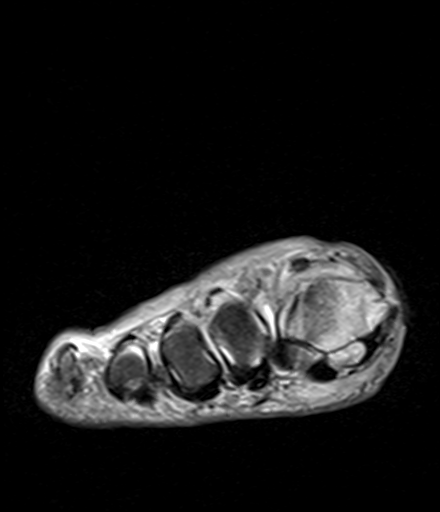
[im 26/44]
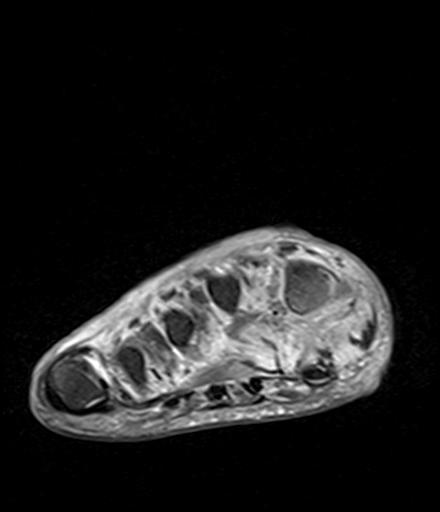
[im 31/44]
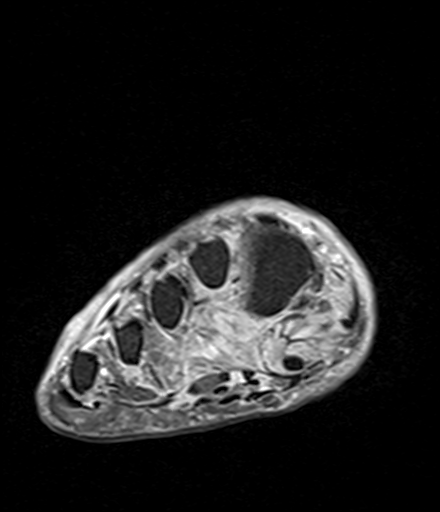
[im 35/44]
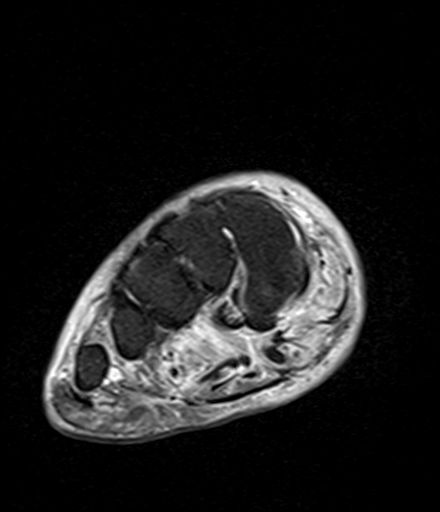
[im 39/44]
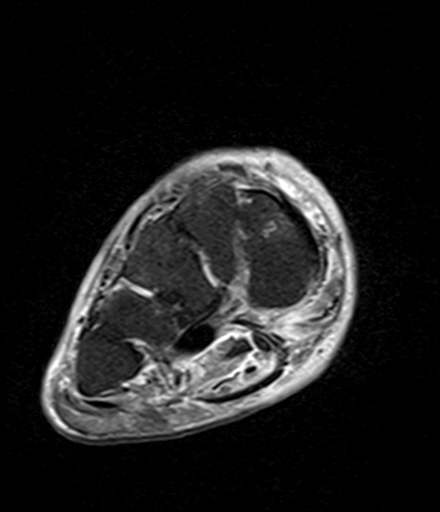
[im 44/44]
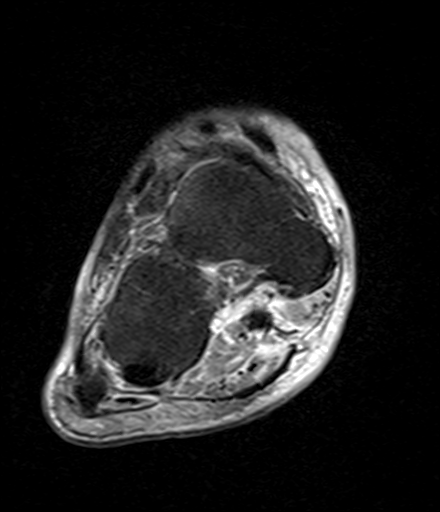

[Series 6: T2 fat-sat · axial · 3.0mm · 0.35mm/px · z∈[-183,-111]mm · 5 of 22 slices shown (2 of 2)]
[im 1/22]
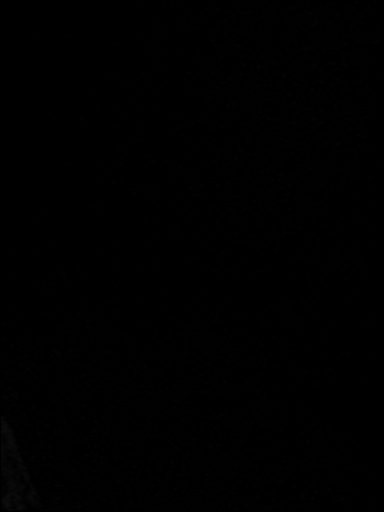
[im 6/22]
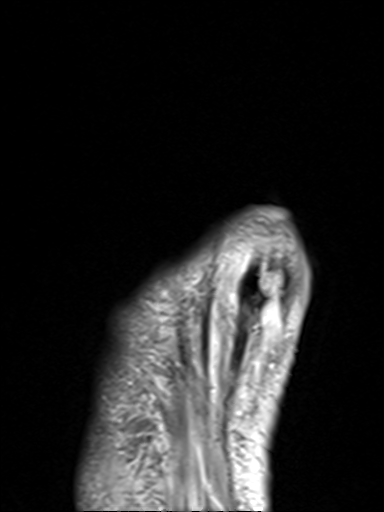
[im 11/22]
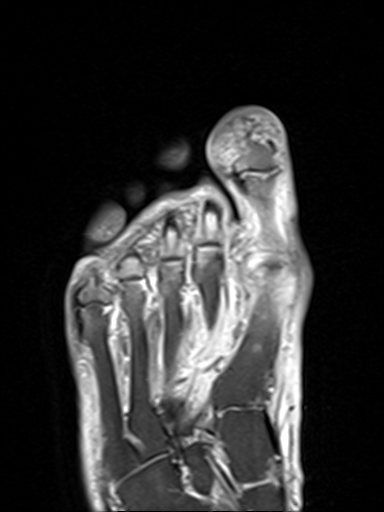
[im 16/22]
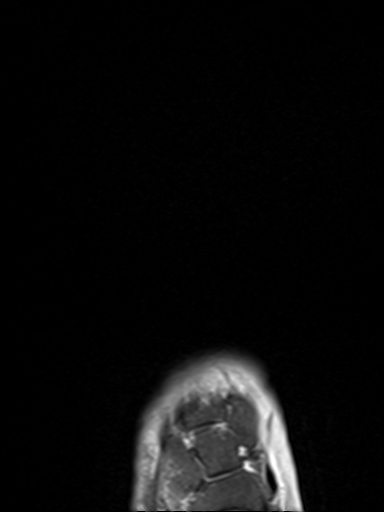
[im 22/22]
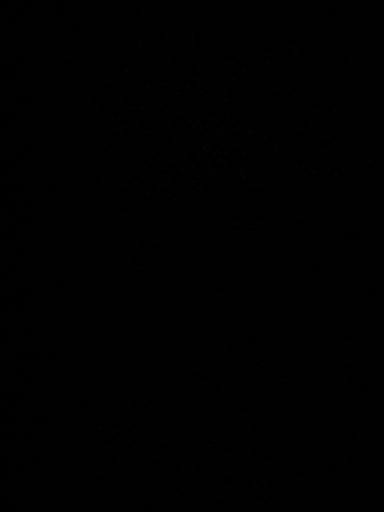

[Series 7: T1 · axial · 3.0mm · 0.35mm/px · z∈[-183,-111]mm · 3 of 22 slices shown (2 of 2)]
[im 1/22]
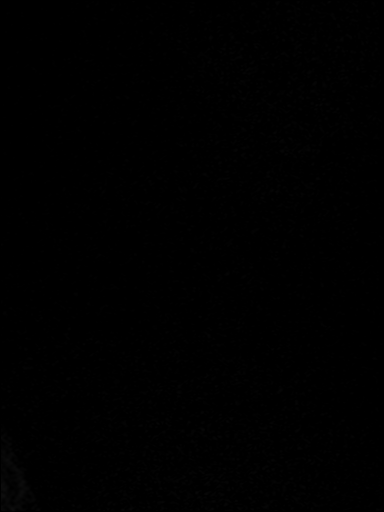
[im 11/22]
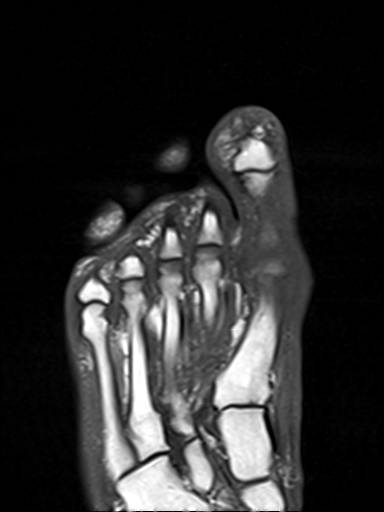
[im 22/22]
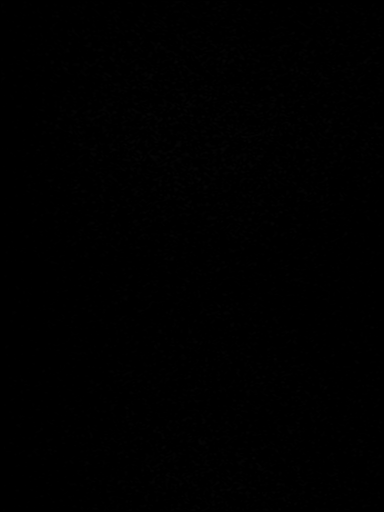

[22 of 40 positions shown; findings below may reference images not displayed]

FINDINGS: Bones/Joint/Cartilage

There is apparent soft tissue ulceration medial to the 1st
metatarsophalangeal joint. There is underlying marrow T2
hyperintensity throughout the head of the 1st metatarsal as well as
the tibial sesamoid and medial base of the 1st proximal phalanx. In
addition, there is abnormal T1 marrow signal medially in the head of
the 1st metatarsal with probable cortical destruction, highly
suspicious for osteomyelitis.

Minimal subchondral cyst formation in the head of the 2nd
metatarsal. The additional metatarsals and digits appear normal. The
alignment is normal at the Lisfranc joint. No significant joint
effusions.

Ligaments

The Lisfranc ligament is intact. There is a suspected defect in the
medial collateral ligament of the 1st metatarsophalangeal joint
along its plantar metatarsal attachment, best seen on coronal image
[DATE].

Muscles and Tendons

The forefoot tendons are intact without tenosynovitis. The plantar
slip of the peroneus longus tendon is noted to be thickened without
abnormal signal. Mild nonspecific T2 hyperintensity within the
forefoot musculature. No focal fluid collection.

Soft tissues

As above, apparent soft tissue ulceration medial to the 1st
metatarsophalangeal joint with diffuse surrounding soft tissue
edema. No focal fluid collection, foreign body or soft tissue
emphysema identified. This soft tissue defect is in very close
proximity to the signal abnormalities in the 1st metatarsal head
described above, making osteomyelitis highly likely.
IMPRESSION: 1. Soft tissue ulceration medial to the 1st metatarsophalangeal
joint with surrounding inflammatory changes. No focal fluid
collection identified.
2. T2 and T1 marrow signal abnormalities within the 1st metatarsal
head highly suspicious for osteomyelitis. In addition, there are
less specific T2 marrow changes within the radial base of the 1st
proximal phalanx and within the tibial sesamoid of the 1st
metatarsal.
3. Possible partial tear of the medial collateral ligament of the
1st MTP joint along its plantar metatarsal attachment.
